# Patient Record
Sex: Female | Born: 1995 | Race: Black or African American | Hispanic: No | Marital: Single | Smoking: Never smoker
Health system: Southern US, Community
[De-identification: ages and names within clinical notes are randomized; demographics above are authoritative.]

## PROBLEM LIST (undated history)

## (undated) ENCOUNTER — Inpatient Hospital Stay (HOSPITAL_COMMUNITY): Payer: Self-pay

## (undated) DIAGNOSIS — D649 Anemia, unspecified: Secondary | ICD-10-CM

## (undated) DIAGNOSIS — Z789 Other specified health status: Secondary | ICD-10-CM

## (undated) HISTORY — PX: TOOTH EXTRACTION: SUR596

## (undated) HISTORY — PX: NO PAST SURGERIES: SHX2092

## (undated) HISTORY — DX: Anemia, unspecified: D64.9

---

## 2015-06-20 ENCOUNTER — Encounter (HOSPITAL_COMMUNITY): Payer: Self-pay | Admitting: *Deleted

## 2015-06-20 ENCOUNTER — Emergency Department (HOSPITAL_COMMUNITY)
Admission: EM | Admit: 2015-06-20 | Discharge: 2015-06-20 | Disposition: A | Discharge status: Home / Self Care | Payer: Medicaid - Out of State | Attending: Emergency Medicine | Admitting: Emergency Medicine

## 2015-06-20 DIAGNOSIS — N939 Abnormal uterine and vaginal bleeding, unspecified: Secondary | ICD-10-CM | POA: Diagnosis not present

## 2015-06-20 DIAGNOSIS — Z202 Contact with and (suspected) exposure to infections with a predominantly sexual mode of transmission: Secondary | ICD-10-CM | POA: Insufficient documentation

## 2015-06-20 NOTE — ED Provider Notes (Signed)
CSN: [id number]     Arrival date & time 06/20/15  1207 History   First MD Initiated Contact with Patient 06/20/15 1247     Chief Complaint  Patient presents with  . Exposure to STD     (Consider location/radiation/quality/duration/timing/severity/associated sxs/prior Treatment) HPI Comments: Pt. Is a 19 y/o F with no significant pmh who presents over concern that she should be tested / treated for an STD. She says her boyfriend was treated for an STD on 7/4 of this month, and that she last had sex with him at the end of June. She says she has not had sex with anyone since. She has not had any vaginal pain, itching, burning, discharge, or abdominal pain. She has not had any nausea or vomiting, fever, or chills. She denies dysuria, hematuria, or suprapubic pain. She has not had any other symptoms. She is anxious because she does not want to have an STD. She does not use condoms with her boyfriend. She is monogomous with him. She has moved down here from [facility]. And does not have a PCP here. She says she tried to go to urgent care, but they could not take her out of state medicaid. She says she would like to follow up with a primary care physician.   The history is provided by the patient.    History reviewed. No pertinent past medical history. History reviewed. No pertinent past surgical history. History reviewed. No pertinent family history. History  Substance Use Topics  . Smoking status: Never Smoker   . Smokeless tobacco: Not on file  . Alcohol Use: No   OB History    No data available     Review of Systems  Constitutional: Negative.  Negative for fever, chills, activity change, appetite change and fatigue.  HENT: Negative.  Negative for congestion and facial swelling.   Eyes: Negative.  Negative for discharge.  Respiratory: Negative.  Negative for apnea, cough, choking and chest tightness.   Cardiovascular: Negative.  Negative for chest pain, palpitations and leg swelling.   Gastrointestinal: Negative.  Negative for nausea, vomiting, abdominal pain, diarrhea, constipation, blood in stool and abdominal distention.  Endocrine: Negative.  Negative for polyuria.  Genitourinary: Positive for frequency. Negative for dysuria, urgency, hematuria, vaginal bleeding, vaginal discharge, difficulty urinating, genital sores, vaginal pain and pelvic pain.  Musculoskeletal: Negative.  Negative for back pain and joint swelling.  Skin: Negative.  Negative for color change, pallor, rash and wound.  Allergic/Immunologic: Negative.   Neurological: Negative.   Hematological: Negative.   Psychiatric/Behavioral: Negative.       Allergies  Review of patient's allergies indicates no known allergies.  Home Medications   Prior to Admission medications   Not on File   BP 137/84 mmHg  Pulse 108  Temp(Src) 98.6 F (37 C) (Oral)  Resp 18  Ht  (1.702 m)  Wt 137 lb 4.8 oz (62.279 kg)  BMI 21.50 kg/m2  SpO2 100%  LMP 06/17/2015 Physical Exam  Constitutional: She is oriented to person, place, and time. She appears well-developed and well-nourished. No distress.  HENT:  Head: Normocephalic and atraumatic.  Nose: Nose normal.  Mouth/Throat: Oropharynx is clear and moist.  Eyes: Conjunctivae and EOM are normal. Pupils are equal, round, and reactive to light. Right eye exhibits no discharge.  Neck: Normal range of motion. Neck supple. No thyromegaly present.  Cardiovascular: Normal rate, regular rhythm, normal heart sounds and intact distal pulses.  Exam reveals no gallop and no friction rub.  No murmur heard. Pulmonary/Chest: Effort normal and breath sounds normal. No respiratory distress. She has no wheezes. She has no rales.  Abdominal: Soft. Bowel sounds are normal. She exhibits no distension and no mass. There is no hepatosplenomegaly. There is no tenderness. There is no rebound, no guarding and no CVA tenderness.  Genitourinary: Uterus normal. Pelvic exam was performed  with patient supine. There is no rash, tenderness, lesion or injury on the right labia. There is no rash, tenderness, lesion or injury on the left labia. Cervix exhibits no discharge and no friability. There is bleeding in the vagina. No erythema or tenderness in the vagina. No foreign body around the vagina. No vaginal discharge found.  Bleeding with blood in the vaginal vault. On her period.   Musculoskeletal: Normal range of motion. She exhibits no edema or tenderness.  Neurological: She is alert and oriented to person, place, and time.  Skin: Skin is warm and dry. No rash noted. She is not diaphoretic. No erythema.  Psychiatric: She has a normal mood and affect.    ED Course  Procedures (including critical care time) Labs Review Labs Reviewed  RPR  HIV ANTIBODY (ROUTINE TESTING)  GC/CHLAMYDIA PROBE AMP ([place]) NOT AT [facility]    Imaging Review No results found.   EKG Interpretation None      MDM   Final diagnoses:  Possible exposure to STD   Pt is a 19 y/o F here with concern about STD's. Mostly anxiety related. She is completely asymptomatic at this time. Will perform pelvic exam and STD testing. Pt on her period. No evidence of G/U infection at this time. Will be called with results of testing in the morning. Follow up with primary care physician.     Yolande Jollyaleb G Consuelo Suthers, MD 06/20/15 1521  Nelva Nayobert Beaton, MD 06/20/15 [phone]

## 2015-06-20 NOTE — ED Notes (Signed)
Pt reports exposure to std and wants to be checked, denies any vaginal symptoms. No acute distress noted at triage.

## 2015-06-20 NOTE — Discharge Instructions (Signed)

## 2015-06-21 LAB — HIV ANTIBODY (ROUTINE TESTING W REFLEX): HIV SCREEN 4TH GENERATION: NONREACTIVE

## 2015-06-21 LAB — GC/CHLAMYDIA PROBE AMP (~~LOC~~) NOT AT ARMC
Chlamydia: NEGATIVE
NEISSERIA GONORRHEA: NEGATIVE

## 2015-06-21 LAB — RPR: RPR Ser Ql: NONREACTIVE

## 2015-06-22 ENCOUNTER — Telehealth (HOSPITAL_COMMUNITY): Payer: Self-pay

## 2015-08-16 ENCOUNTER — Encounter (HOSPITAL_COMMUNITY): Payer: Self-pay | Admitting: Emergency Medicine

## 2015-08-16 ENCOUNTER — Emergency Department (HOSPITAL_COMMUNITY)
Admission: EM | Admit: 2015-08-16 | Discharge: 2015-08-16 | Disposition: A | Discharge status: Home / Self Care | Payer: Medicaid - Out of State | Attending: Emergency Medicine | Admitting: Emergency Medicine

## 2015-08-16 ENCOUNTER — Emergency Department (HOSPITAL_COMMUNITY): Payer: Medicaid - Out of State

## 2015-08-16 DIAGNOSIS — F1210 Cannabis abuse, uncomplicated: Secondary | ICD-10-CM | POA: Insufficient documentation

## 2015-08-16 DIAGNOSIS — Y998 Other external cause status: Secondary | ICD-10-CM | POA: Insufficient documentation

## 2015-08-16 DIAGNOSIS — S0011XA Contusion of right eyelid and periocular area, initial encounter: Secondary | ICD-10-CM | POA: Diagnosis not present

## 2015-08-16 DIAGNOSIS — W1839XA Other fall on same level, initial encounter: Secondary | ICD-10-CM | POA: Insufficient documentation

## 2015-08-16 DIAGNOSIS — T148XXA Other injury of unspecified body region, initial encounter: Secondary | ICD-10-CM

## 2015-08-16 DIAGNOSIS — Y9289 Other specified places as the place of occurrence of the external cause: Secondary | ICD-10-CM | POA: Insufficient documentation

## 2015-08-16 DIAGNOSIS — Y9389 Activity, other specified: Secondary | ICD-10-CM | POA: Diagnosis not present

## 2015-08-16 DIAGNOSIS — D500 Iron deficiency anemia secondary to blood loss (chronic): Secondary | ICD-10-CM | POA: Insufficient documentation

## 2015-08-16 DIAGNOSIS — S0993XA Unspecified injury of face, initial encounter: Secondary | ICD-10-CM | POA: Diagnosis present

## 2015-08-16 LAB — I-STAT BETA HCG BLOOD, ED (MC, WL, AP ONLY): I-stat hCG, quantitative: <5 m[IU]/mL (ref <5)

## 2015-08-16 LAB — CBC WITH DIFFERENTIAL/PLATELET
BASOS ABS: 0 10*3/uL (ref 0.0–0.1)
Basophils Relative: 1 % (ref 0–1)
Eosinophils Absolute: 0 10*3/uL (ref 0.0–0.7)
Eosinophils Relative: 1 % (ref 0–5)
HCT: 23.3 % — ABNORMAL LOW (ref 36.0–46.0)
Hemoglobin: 6.6 g/dL — CL (ref 12.0–15.0)
Lymphocytes Relative: 32 % (ref 12–46)
Lymphs Abs: 1.4 10*3/uL (ref 0.7–4.0)
MCH: 19.1 pg — ABNORMAL LOW (ref 26.0–34.0)
MCHC: 28.3 g/dL — AB (ref 30.0–36.0)
MCV: 67.3 fL — ABNORMAL LOW (ref 78.0–100.0)
MONO ABS: 0.4 10*3/uL (ref 0.1–1.0)
MONOS PCT: 10 % (ref 3–12)
NEUTROS ABS: 2.6 10*3/uL (ref 1.7–7.7)
Neutrophils Relative %: 56 % (ref 43–77)
PLATELETS: 243 10*3/uL (ref 150–400)
RBC: 3.46 MIL/uL — AB (ref 3.87–5.11)
RDW: 18 % — AB (ref 11.5–15.5)
WBC: 4.4 10*3/uL (ref 4.0–10.5)

## 2015-08-16 LAB — COMPREHENSIVE METABOLIC PANEL
ALBUMIN: 4.2 g/dL (ref 3.5–5.0)
ALT: 12 U/L — ABNORMAL LOW (ref 14–54)
ANION GAP: 8 (ref 5–15)
AST: 22 U/L (ref 15–41)
Alkaline Phosphatase: 45 U/L (ref 38–126)
BILIRUBIN TOTAL: 0.4 mg/dL (ref 0.3–1.2)
BUN: 6 mg/dL (ref 6–20)
CO2: 24 mmol/L (ref 22–32)
Calcium: 9.2 mg/dL (ref 8.9–10.3)
Chloride: 111 mmol/L (ref 101–111)
Creatinine, Ser: 0.59 mg/dL (ref 0.44–1.00)
GFR calc Af Amer: >60 mL/min (ref >60)
GFR calc non Af Amer: >60 mL/min (ref >60)
Glucose, Bld: 89 mg/dL (ref 65–99)
POTASSIUM: 4 mmol/L (ref 3.5–5.1)
SODIUM: 143 mmol/L (ref 135–145)
TOTAL PROTEIN: 7.3 g/dL (ref 6.5–8.1)

## 2015-08-16 LAB — URINALYSIS, ROUTINE W REFLEX MICROSCOPIC
Bilirubin Urine: NEGATIVE
Glucose, UA: NEGATIVE mg/dL
Hgb urine dipstick: NEGATIVE
KETONES UR: NEGATIVE mg/dL
LEUKOCYTES UA: NEGATIVE
NITRITE: NEGATIVE
PH: 8 (ref 5.0–8.0)
Protein, ur: NEGATIVE mg/dL
Specific Gravity, Urine: 1.013 (ref 1.005–1.030)
Urobilinogen, UA: 0.2 mg/dL (ref 0.0–1.0)

## 2015-08-16 LAB — SAMPLE TO BLOOD BANK

## 2015-08-16 LAB — RAPID URINE DRUG SCREEN, HOSP PERFORMED
Amphetamines: NOT DETECTED
BENZODIAZEPINES: NOT DETECTED
Barbiturates: NOT DETECTED
COCAINE: NOT DETECTED
OPIATES: NOT DETECTED
TETRAHYDROCANNABINOL: POSITIVE — AB

## 2015-08-16 LAB — ETHANOL: Alcohol, Ethyl (B): 108 mg/dL — ABNORMAL HIGH (ref <5)

## 2015-08-16 MED ORDER — LORAZEPAM 2 MG/ML IJ SOLN
0.5000 mg | Freq: Once | INTRAMUSCULAR | Status: AC
  Administered 2015-08-16: 0.5 mg via INTRAVENOUS
  Filled 2015-08-16: qty 1

## 2015-08-16 MED ORDER — SODIUM CHLORIDE 0.9 % IV BOLUS (SEPSIS)
1000.0000 mL | Freq: Once | INTRAVENOUS | Status: AC
  Administered 2015-08-16: 1000 mL via INTRAVENOUS

## 2015-08-16 MED ORDER — FERROUS SULFATE 325 (65 FE) MG PO TABS
325.0000 mg | ORAL_TABLET | Freq: Every day | ORAL | Status: DC
Start: 2015-08-16 — End: 2016-10-28

## 2015-08-16 NOTE — ED Notes (Signed)
Critical Hmg 6.6-MD Pollina made aware.

## 2015-08-16 NOTE — Discharge Instructions (Signed)
Hematoma A hematoma is a collection of blood under the skin, in an organ, in a body space, in a joint space, or in other tissue. The blood can clot to form a lump that you can see and feel. The lump is often firm and may sometimes become sore and tender. Most hematomas get better in a few days to weeks. However, some hematomas may be serious and require medical care. Hematomas can range in size from very small to very large. CAUSES  A hematoma can be caused by a blunt or penetrating injury. It can also be caused by spontaneous leakage from a blood vessel under the skin. Spontaneous leakage from a blood vessel is more likely to occur in older people, especially those taking blood thinners. Sometimes, a hematoma can develop after certain medical procedures. SIGNS AND SYMPTOMS   A firm lump on the body.  Possible pain and tenderness in the area.  Bruising.Blue, dark blue, purple-red, or yellowish skin may appear at the site of the hematoma if the hematoma is close to the surface of the skin. For hematomas in deeper tissues or body spaces, the signs and symptoms may be subtle. For example, an intra-abdominal hematoma may cause abdominal pain, weakness, fainting, and shortness of breath. An intracranial hematoma may cause a headache or symptoms such as weakness, trouble speaking, or a change in consciousness. DIAGNOSIS  A hematoma can usually be diagnosed based on your medical history and a physical exam. Imaging tests may be needed if your health care provider suspects a hematoma in deeper tissues or body spaces, such as the abdomen, head, or chest. These tests may include ultrasonography or a CT scan.  TREATMENT  Hematomas usually go away on their own over time. Rarely does the blood need to be drained out of the body. Large hematomas or those that may affect vital organs will sometimes need surgical drainage or monitoring. HOME CARE INSTRUCTIONS   Apply ice to the injured area:   Put ice in a  plastic bag.   Place a towel between your skin and the bag.   Leave the ice on for 20 minutes, 2-3 times a day for the first 1 to 2 days.   After the first 2 days, switch to using warm compresses on the hematoma.   Elevate the injured area to help decrease pain and swelling. Wrapping the area with an elastic bandage may also be helpful. Compression helps to reduce swelling and promotes shrinking of the hematoma. Make sure the bandage is not wrapped too tight.   If your hematoma is on a lower extremity and is painful, crutches may be helpful for a couple days.   Only take over-the-counter or prescription medicines as directed by your health care provider. SEEK IMMEDIATE MEDICAL CARE IF:   You have increasing pain, or your pain is not controlled with medicine.   You have a fever.   You have worsening swelling or discoloration.   Your skin over the hematoma breaks or starts bleeding.   Your hematoma is in your chest or abdomen and you have weakness, shortness of breath, or a change in consciousness.  Your hematoma is on your scalp (caused by a fall or injury) and you have a worsening headache or a change in alertness or consciousness. MAKE SURE YOU:   Understand these instructions.  Will watch your condition.  Will get help right away if you are not doing well or get worse. Document Released: 07/04/2004 Document Revised: 07/23/2013 Document Reviewed: 04/30/2013  ExitCare Patient Information 2015 [place], [place]. This information is not intended to replace advice given to you by your health care provider. Make sure you discuss any questions you have with your health care provider.  Iron Deficiency Anemia Anemia is a condition in which there are less red blood cells or hemoglobin in the blood than normal. Hemoglobin is the part of red blood cells that carries oxygen. Iron deficiency anemia is anemia caused by too little iron. It is the most common type of anemia. It may leave  you tired and short of breath. CAUSES   Lack of iron in the diet.  Poor absorption of iron, as seen with intestinal disorders.  Intestinal bleeding.  Heavy periods. SIGNS AND SYMPTOMS  Mild anemia may not be noticeable. Symptoms may include:  Fatigue.  Headache.  Pale skin.  Weakness.  Tiredness.  Shortness of breath.  Dizziness.  Cold hands and feet.  Fast or irregular heartbeat. DIAGNOSIS  Diagnosis requires a thorough evaluation and physical exam by your health care provider. Blood tests are generally used to confirm iron deficiency anemia. Additional tests may be done to find the underlying cause of your anemia. These may include:  Testing for blood in the stool (fecal occult blood test).  A procedure to see inside the colon and rectum (colonoscopy).  A procedure to see inside the esophagus and stomach (endoscopy). TREATMENT  Iron deficiency anemia is treated by correcting the cause of the deficiency. Treatment may involve:  Adding iron-rich foods to your diet.  Taking iron supplements. Pregnant or breastfeeding women need to take extra iron because their normal diet usually does not provide the required amount.  Taking vitamins. Vitamin C improves the absorption of iron. Your health care provider may recommend that you take your iron tablets with a glass of orange juice or vitamin C supplement.  Medicines to make heavy menstrual flow lighter.  Surgery. HOME CARE INSTRUCTIONS   Take iron as directed by your health care provider.  If you cannot tolerate taking iron supplements by mouth, talk to your health care provider about taking them through a vein (intravenously) or an injection into a muscle.  For the best iron absorption, iron supplements should be taken on an empty stomach. If you cannot tolerate them on an empty stomach, you may need to take them with food.  Do not drink milk or take antacids at the same time as your iron supplements. Milk and  antacids may interfere with the absorption of iron.  Iron supplements can cause constipation. Make sure to include fiber in your diet to prevent constipation. A stool softener may also be recommended.  Take vitamins as directed by your health care provider.  Eat a diet rich in iron. Foods high in iron include liver, lean beef, whole-grain bread, eggs, dried fruit, and dark green leafy vegetables. SEEK IMMEDIATE MEDICAL CARE IF:   You faint. If this happens, do not drive. Call your local emergency services (911 in [place] if no other help is available.  You have chest pain.  You feel nauseous or vomit.  You have severe or increased shortness of breath with activity.  You feel weak.  You have a rapid heartbeat.  You have unexplained sweating.  You become light-headed when getting up from a chair or bed. MAKE SURE YOU:   Understand these instructions.  Will watch your condition.  Will get help right away if you are not doing well or get worse. Document Released: 11/17/2000 Document Revised: 11/25/2013 Document  Document Reviewed: 07/28/2013 °ExitCare® Patient Information ©2015 [employer]. This information is not intended to replace advice given to you by your health care provider. Make sure you discuss any questions you have with your health care provider. ° °
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
## 2016-02-28 ENCOUNTER — Encounter (HOSPITAL_COMMUNITY): Payer: Self-pay | Admitting: Emergency Medicine

## 2016-02-28 DIAGNOSIS — Z3202 Encounter for pregnancy test, result negative: Secondary | ICD-10-CM | POA: Insufficient documentation

## 2016-02-28 DIAGNOSIS — B373 Candidiasis of vulva and vagina: Secondary | ICD-10-CM | POA: Insufficient documentation

## 2016-02-28 DIAGNOSIS — N390 Urinary tract infection, site not specified: Secondary | ICD-10-CM | POA: Insufficient documentation

## 2016-02-28 DIAGNOSIS — N760 Acute vaginitis: Secondary | ICD-10-CM | POA: Insufficient documentation

## 2016-02-28 LAB — BASIC METABOLIC PANEL
Anion gap: 8 (ref 5–15)
BUN: 6 mg/dL (ref 6–20)
CHLORIDE: 108 mmol/L (ref 101–111)
CO2: 23 mmol/L (ref 22–32)
Calcium: 9.4 mg/dL (ref 8.9–10.3)
Creatinine, Ser: 0.65 mg/dL (ref 0.44–1.00)
GFR calc Af Amer: >60 mL/min (ref >60)
GFR calc non Af Amer: >60 mL/min (ref >60)
Glucose, Bld: 90 mg/dL (ref 65–99)
Potassium: 3.7 mmol/L (ref 3.5–5.1)
Sodium: 139 mmol/L (ref 135–145)

## 2016-02-28 LAB — CBC WITH DIFFERENTIAL/PLATELET
Basophils Absolute: 0 10*3/uL (ref 0.0–0.1)
Basophils Relative: 1 %
EOS PCT: 3 %
Eosinophils Absolute: 0.1 10*3/uL (ref 0.0–0.7)
HEMATOCRIT: 27.3 % — AB (ref 36.0–46.0)
HEMOGLOBIN: 7.6 g/dL — AB (ref 12.0–15.0)
LYMPHS ABS: 1.5 10*3/uL (ref 0.7–4.0)
Lymphocytes Relative: 41 %
MCH: 19.1 pg — AB (ref 26.0–34.0)
MCHC: 27.8 g/dL — ABNORMAL LOW (ref 30.0–36.0)
MCV: 68.6 fL — AB (ref 78.0–100.0)
MONOS PCT: 11 %
Monocytes Absolute: 0.4 10*3/uL (ref 0.1–1.0)
NEUTROS ABS: 1.7 10*3/uL (ref 1.7–7.7)
Neutrophils Relative %: 44 %
Platelets: 321 10*3/uL (ref 150–400)
RBC: 3.98 MIL/uL (ref 3.87–5.11)
RDW: 17.5 % — ABNORMAL HIGH (ref 11.5–15.5)
WBC: 3.7 10*3/uL — ABNORMAL LOW (ref 4.0–10.5)

## 2016-02-28 LAB — URINALYSIS, ROUTINE W REFLEX MICROSCOPIC
BILIRUBIN URINE: NEGATIVE
Glucose, UA: NEGATIVE mg/dL
Hgb urine dipstick: NEGATIVE
Ketones, ur: NEGATIVE mg/dL
Nitrite: NEGATIVE
PH: 7 (ref 5.0–8.0)
Protein, ur: NEGATIVE mg/dL
SPECIFIC GRAVITY, URINE: 1.014 (ref 1.005–1.030)

## 2016-02-28 LAB — URINE MICROSCOPIC-ADD ON

## 2016-02-28 LAB — POC URINE PREG, ED: PREG TEST UR: NEGATIVE

## 2016-02-28 NOTE — ED Notes (Signed)
Pt. requesting STD screening reports vaginal discharge onset last week , denies dysuria or fever .

## 2016-02-29 ENCOUNTER — Emergency Department (HOSPITAL_COMMUNITY)
Admission: EM | Admit: 2016-02-29 | Discharge: 2016-02-29 | Disposition: A | Discharge status: Home / Self Care | Payer: Medicaid - Out of State | Attending: Physician Assistant | Admitting: Physician Assistant

## 2016-02-29 DIAGNOSIS — N760 Acute vaginitis: Secondary | ICD-10-CM

## 2016-02-29 DIAGNOSIS — N390 Urinary tract infection, site not specified: Secondary | ICD-10-CM

## 2016-02-29 DIAGNOSIS — B379 Candidiasis, unspecified: Secondary | ICD-10-CM

## 2016-02-29 DIAGNOSIS — B9689 Other specified bacterial agents as the cause of diseases classified elsewhere: Secondary | ICD-10-CM

## 2016-02-29 LAB — HIV ANTIBODY (ROUTINE TESTING W REFLEX): HIV Screen 4th Generation wRfx: NONREACTIVE

## 2016-02-29 LAB — GC/CHLAMYDIA PROBE AMP (~~LOC~~) NOT AT ARMC
CHLAMYDIA, DNA PROBE: NEGATIVE
NEISSERIA GONORRHEA: NEGATIVE

## 2016-02-29 LAB — WET PREP, GENITAL
SPERM: NONE SEEN
Trich, Wet Prep: NONE SEEN

## 2016-02-29 LAB — RPR: RPR: NONREACTIVE

## 2016-02-29 MED ORDER — METRONIDAZOLE 500 MG PO TABS
500.0000 mg | ORAL_TABLET | Freq: Once | ORAL | Status: AC
Start: 2016-02-29 — End: 2016-02-29
  Administered 2016-02-29: 500 mg via ORAL
  Filled 2016-02-29: qty 1

## 2016-02-29 MED ORDER — CEFTRIAXONE SODIUM 250 MG IJ SOLR: 250.0000 mg | Freq: Once | INTRAMUSCULAR | Status: DC

## 2016-02-29 MED ORDER — ONDANSETRON HCL 4 MG PO TABS
4.0000 mg | ORAL_TABLET | Freq: Once | ORAL | Status: AC
  Administered 2016-02-29: 4 mg via ORAL
  Filled 2016-02-29: qty 1

## 2016-02-29 MED ORDER — CEFTRIAXONE SODIUM 1 G IJ SOLR
1.0000 g | Freq: Once | INTRAMUSCULAR | Status: AC
  Administered 2016-02-29: 1 g via INTRAMUSCULAR
  Filled 2016-02-29: qty 10

## 2016-02-29 MED ORDER — LIDOCAINE HCL (PF) 1 % IJ SOLN
2.0000 mL | Freq: Once | INTRAMUSCULAR | Status: AC
  Administered 2016-02-29: 2 mL
  Filled 2016-02-29: qty 5

## 2016-02-29 MED ORDER — AZITHROMYCIN 250 MG PO TABS
1000.0000 mg | ORAL_TABLET | Freq: Once | ORAL | Status: AC
  Administered 2016-02-29: 1000 mg via ORAL
  Filled 2016-02-29: qty 4

## 2016-02-29 MED ORDER — METRONIDAZOLE 500 MG PO TABS: 500.0000 mg | ORAL_TABLET | Freq: Two times a day (BID) | ORAL | Status: DC

## 2016-02-29 MED ORDER — NITROFURANTOIN MONOHYD MACRO 100 MG PO CAPS
100.0000 mg | ORAL_CAPSULE | Freq: Once | ORAL | Status: AC
  Administered 2016-02-29: 100 mg via ORAL
  Filled 2016-02-29: qty 1

## 2016-02-29 MED ORDER — NITROFURANTOIN MONOHYD MACRO 100 MG PO CAPS: 100.0000 mg | ORAL_CAPSULE | Freq: Two times a day (BID) | ORAL | Status: DC

## 2016-02-29 MED ORDER — FLUCONAZOLE 100 MG PO TABS
150.0000 mg | ORAL_TABLET | Freq: Once | ORAL | Status: AC
  Administered 2016-02-29: 150 mg via ORAL
  Filled 2016-02-29: qty 2

## 2016-02-29 NOTE — Discharge Instructions (Signed)

## 2016-02-29 NOTE — ED Provider Notes (Addendum)
CSN: [id number]     Arrival date & time 02/28/16  2037 History  By signing my name below, I, Darlene Ortiz, attest that this documentation has been prepared under the direction and in the presence of Darlene Grell Ortiz An, MD. Electronically Signed: Budd Ortiz, ED Scribe. 02/29/2016. 2:30 AM.      Chief Complaint  Patient presents with  . Vaginal Discharge   The history is provided by the patient. No language interpreter was used.   HPI Comments: Darlene Ortiz is a 20 y.o. female who presents to the Emergency Department complaining of vaginal discharge onset 1 week ago. Pt states she thought the discharge was due to her coming off of her period on 3/10, but is now concerned for a possible STD. She notes her partner was seen for dysuria today and treated for gonorrhea. Pt denies fever and dysuria. She reports NKDA.   History reviewed. No pertinent past medical history. History reviewed. No pertinent past surgical history. No family history on file. Social History  Substance Use Topics  . Smoking status: Never Smoker   . Smokeless tobacco: None  . Alcohol Use: Yes   OB History    No data available     Review of Systems  Constitutional: Negative for fever.  Genitourinary: Positive for vaginal discharge. Negative for dysuria.  All other systems reviewed and are negative.   Allergies  Review of patient's allergies indicates no known allergies.  Home Medications   Prior to Admission medications   Medication Sig Start Date End Date Taking? Authorizing Provider  ferrous sulfate 325 (65 FE) MG tablet Take 1 tablet (325 mg total) by mouth daily. Patient not taking: Reported on 02/29/2016 08/16/15   Gilda Crease, MD   BP 136/88 mmHg  Pulse 86  Temp(Src) 98.9 F (37.2 C) (Oral)  Resp 16  Ht  (1.702 m)  Wt 134 lb (60.782 kg)  BMI 20.98 kg/m2  SpO2 95%  LMP 02/08/2016 (Approximate) Physical Exam  Constitutional: She is oriented to person, place, and time. She  appears well-developed and well-nourished.  HENT:  Head: Normocephalic and atraumatic.  Eyes: Conjunctivae are normal. Right eye exhibits no discharge. Left eye exhibits no discharge.  Pulmonary/Chest: Effort normal. No respiratory distress.  Genitourinary:  Female chaperone present, NO CMT  Neurological: She is alert and oriented to person, place, and time. Coordination normal.  Skin: Skin is warm and dry. No rash noted. She is not diaphoretic. No erythema.  Psychiatric: She has a normal mood and affect.  Nursing note and vitals reviewed.   ED Course  Procedures  DIAGNOSTIC STUDIES: Oxygen Saturation is 100% on RA, normal by my interpretation.    COORDINATION OF CARE: 2:16 AM - Discussed lab results of UTI and plans to order antibiotics. Will treat for gonorrhea and chlamydia. Pt advised of plan for treatment and pt agrees.  Labs Review Labs Reviewed  WET PREP, GENITAL - Abnormal; Notable for the following:    Yeast Wet Prep HPF POC PRESENT (*)    Clue Cells Wet Prep HPF POC PRESENT (*)    WBC, Wet Prep HPF POC MANY (*)    All other components within normal limits  URINALYSIS, ROUTINE W REFLEX MICROSCOPIC (NOT AT [facility]) - Abnormal; Notable for the following:    APPearance CLOUDY (*)    Leukocytes, UA SMALL (*)    All other components within normal limits  CBC WITH DIFFERENTIAL/PLATELET - Abnormal; Notable for the following:    WBC 3.7 (*)  Hemoglobin 7.6 (*)    HCT 27.3 (*)    MCV 68.6 (*)    MCH 19.1 (*)    MCHC 27.8 (*)    RDW 17.5 (*)    All other components within normal limits  URINE MICROSCOPIC-ADD ON - Abnormal; Notable for the following:    Squamous Epithelial / LPF 6-30 (*)    Bacteria, UA FEW (*)    All other components within normal limits  BASIC METABOLIC PANEL  HIV ANTIBODY (ROUTINE TESTING)  RPR  POC URINE PREG, ED  GC/CHLAMYDIA PROBE AMP ([place]) NOT AT [facility]    Imaging Review No results found. I have personally reviewed and evaluated these  images and lab results as part of my medical decision-making.   EKG Interpretation None      MDM   Final diagnoses:  None    Patient is a 20 year old female presenting with vaginal discharge. Patient's partner came to the emergency department with dysuria and was treated prophylactically. Patient like the same treatment. Vaginal exam showed no cervical motion tenderness. No evidence of discharge. We will treat with ceftriaxone and azithromycin. Patient also has urinary tract infection. We'll treat with Macrobid.  I personally performed the services described in this documentation, which was scribed in my presence. The recorded information has been reviewed and is accurate.    3:41 AM In addition to the urinary tract infection, patient has evidence of BV and yeast infection. This is in addition to prophylactic treatment for GC and chlamydia.  We'll give single dose of Diflucan. We'll give Flagyl along with Macrobid to take at home.  Darlene Ortiz AnLyn Cougar Imel, MD 02/29/16 0244  Darlene Ortiz Ortiz AnLyn Tamula Morrical, MD 02/29/16 [phone]

## 2016-10-28 ENCOUNTER — Inpatient Hospital Stay (HOSPITAL_COMMUNITY)
Admission: AD | Admit: 2016-10-28 | Discharge: 2016-10-28 | Disposition: A | Discharge status: Home / Self Care | Payer: Medicaid - Out of State | Source: Ambulatory Visit | Attending: Obstetrics and Gynecology | Admitting: Obstetrics and Gynecology

## 2016-10-28 ENCOUNTER — Encounter (HOSPITAL_COMMUNITY): Payer: Self-pay | Admitting: *Deleted

## 2016-10-28 DIAGNOSIS — F10129 Alcohol abuse with intoxication, unspecified: Secondary | ICD-10-CM | POA: Insufficient documentation

## 2016-10-28 DIAGNOSIS — F10120 Alcohol abuse with intoxication, uncomplicated: Secondary | ICD-10-CM

## 2016-10-28 DIAGNOSIS — R109 Unspecified abdominal pain: Secondary | ICD-10-CM

## 2016-10-28 DIAGNOSIS — N898 Other specified noninflammatory disorders of vagina: Secondary | ICD-10-CM

## 2016-10-28 HISTORY — DX: Other specified health status: Z78.9

## 2016-10-28 LAB — URINALYSIS, ROUTINE W REFLEX MICROSCOPIC
Bilirubin Urine: NEGATIVE
GLUCOSE, UA: NEGATIVE mg/dL
Hgb urine dipstick: NEGATIVE
Ketones, ur: NEGATIVE mg/dL
LEUKOCYTES UA: NEGATIVE
Nitrite: NEGATIVE
PROTEIN: NEGATIVE mg/dL
SPECIFIC GRAVITY, URINE: 1.015 (ref 1.005–1.030)
pH: 6.5 (ref 5.0–8.0)

## 2016-10-28 LAB — ETHANOL: Alcohol, Ethyl (B): <5 mg/dL (ref <5)

## 2016-10-28 LAB — RAPID URINE DRUG SCREEN, HOSP PERFORMED
AMPHETAMINES: NOT DETECTED
Barbiturates: NOT DETECTED
Benzodiazepines: NOT DETECTED
Cocaine: NOT DETECTED
Opiates: NOT DETECTED
TETRAHYDROCANNABINOL: POSITIVE — AB

## 2016-10-28 LAB — WET PREP, GENITAL
Sperm: NONE SEEN
TRICH WET PREP: NONE SEEN
Yeast Wet Prep HPF POC: NONE SEEN

## 2016-10-28 LAB — POCT PREGNANCY, URINE: PREG TEST UR: NEGATIVE

## 2016-10-28 MED ORDER — ONDANSETRON 8 MG PO TBDP: 8.0000 mg | ORAL_TABLET | Freq: Three times a day (TID) | ORAL | 0 refills | Status: DC | PRN

## 2016-10-28 MED ORDER — ONDANSETRON 8 MG PO TBDP
8.0000 mg | ORAL_TABLET | Freq: Once | ORAL | Status: AC
  Administered 2016-10-28: 8 mg via ORAL
  Filled 2016-10-28: qty 1

## 2016-10-28 NOTE — MAU Provider Note (Signed)
History     CSN: [id number]  Arrival date and time: 10/28/16 1337   First Provider Initiated Contact with Patient 10/28/16 1619      Chief Complaint  Patient presents with  . Abdominal Cramping  . Vaginal Discharge   Darlene Ortiz is a 20 y.o. Who presents today with N/V. She states that she went out last night, and was drinking. She is unsure if there was something in her drink. She states that when she vomits now it tastes very sour. She also reports that she has had vaginal discharge for over a month, and she wants to have it checked while she is here.    Abdominal Cramping  This is a new problem. The current episode started today. The onset quality is sudden. The problem occurs constantly. The problem has been unchanged. The pain is located in the generalized abdominal region. The pain is at a severity of 10/10. The quality of the pain is sharp. The abdominal pain does not radiate. Associated symptoms include nausea and vomiting. Pertinent negatives include no constipation, diarrhea, dysuria, fever or frequency. Nothing aggravates the pain. The pain is relieved by nothing.  Vaginal Discharge  The patient's primary symptoms include vaginal discharge. This is a new problem. The current episode started more than 1 month ago. The problem occurs constantly. The problem has been unchanged. The patient is experiencing no pain. Associated symptoms include abdominal pain, nausea and vomiting. Pertinent negatives include no chills, constipation, diarrhea, dysuria, fever, frequency or urgency. The vaginal discharge was thick, white and malodorous. There has been no bleeding. Nothing aggravates the symptoms. She has tried nothing for the symptoms. She uses nothing for contraception.     Past Medical History:  Diagnosis Date  . Medical history non-contributory     Past Surgical History:  Procedure Laterality Date  . NO PAST SURGERIES      History reviewed. No pertinent family  history.  Social History  Substance Use Topics  . Smoking status: Never Smoker  . Smokeless tobacco: Never Used  . Alcohol use Yes    Allergies: No Known Allergies  No prescriptions prior to admission.    Review of Systems  Constitutional: Negative for chills and fever.  Gastrointestinal: Positive for abdominal pain, nausea and vomiting. Negative for constipation and diarrhea.  Genitourinary: Positive for vaginal discharge. Negative for dysuria, frequency and urgency.   Physical Exam   Blood pressure 124/61, pulse 91, temperature 98.3 F (36.8 C), temperature source Oral, resp. rate 16, weight 135 lb 6.4 oz (61.4 kg), last menstrual period 09/28/2016.  Physical Exam  Nursing note and vitals reviewed. Constitutional: She is oriented to person, place, and time. She appears well-developed and well-nourished. No distress.  HENT:  Head: Normocephalic.  Cardiovascular: Normal rate.   Respiratory: Effort normal.  GI: Soft. There is no tenderness. There is no rebound.  Neurological: She is alert and oriented to person, place, and time.  Skin: Skin is warm and dry.  Psychiatric: She has a normal mood and affect.   Results for orders placed or performed during the hospital encounter of 10/28/16 (from the past 24 hour(s))  Urinalysis, Routine w reflex microscopic (not at [facility])     Status: None   Collection Time: 10/28/16  3:06 PM  Result Value Ref Range   Color, Urine YELLOW YELLOW   APPearance CLEAR CLEAR   Specific Gravity, Urine 1.015 1.005 - 1.030   pH 6.5 5.0 - 8.0   Glucose, UA NEGATIVE NEGATIVE mg/dL  Hgb urine dipstick NEGATIVE NEGATIVE   Bilirubin Urine NEGATIVE NEGATIVE   Ketones, ur NEGATIVE NEGATIVE mg/dL   Protein, ur NEGATIVE NEGATIVE mg/dL   Nitrite NEGATIVE NEGATIVE   Leukocytes, UA NEGATIVE NEGATIVE  Urine rapid drug screen (hosp performed)     Status: Abnormal   Collection Time: 10/28/16  3:06 PM  Result Value Ref Range   Opiates NONE DETECTED NONE  DETECTED   Cocaine NONE DETECTED NONE DETECTED   Benzodiazepines NONE DETECTED NONE DETECTED   Amphetamines NONE DETECTED NONE DETECTED   Tetrahydrocannabinol POSITIVE (A) NONE DETECTED   Barbiturates NONE DETECTED NONE DETECTED  Pregnancy, urine POC     Status: None   Collection Time: 10/28/16  3:17 PM  Result Value Ref Range   Preg Test, Ur NEGATIVE NEGATIVE  Wet prep, genital     Status: Abnormal   Collection Time: 10/28/16  4:31 PM  Result Value Ref Range   Yeast Wet Prep HPF POC NONE SEEN NONE SEEN   Trich, Wet Prep NONE SEEN NONE SEEN   Clue Cells Wet Prep HPF POC PRESENT (A) NONE SEEN   WBC, Wet Prep HPF POC FEW (A) NONE SEEN   Sperm NONE SEEN   Ethanol     Status: None   Collection Time: 10/28/16  5:26 PM  Result Value Ref Range   Alcohol, Ethyl (B) <5 <5 mg/dL    MAU Course  Procedures  MDM Patient has had zofran. N/V is controlled here.   Assessment and Plan   1. Hangover effect, uncomplicated (HCC)    . DC home Comfort measures reviewed  RX: zofran ODT PRN #20  Return to MAU as needed   Follow-up Information    [facility]-EMERGENCY DEPT Follow up.   Specialty:  Emergency Medicine Contact information: [address] [id number] [place] [id number] [phone]           Darlene Ortiz, Darlene Ortiz 10/28/2016, 7:05 PM

## 2016-10-28 NOTE — Discharge Instructions (Signed)
Alcohol Intoxication Alcohol intoxication occurs when a person no longer thinks clearly or functions well (becomes impaired) after drinking alcohol. Intoxication can occur with even one drink. The level of impairment depends on:  The amount of alcohol the person had.  The person's age, gender, and weight.  How often the person drinks.  Whether the person has other medical conditions, such as diabetes, seizures, or a heart condition. Alcohol intoxication can range in severity from mild to severe. The condition can be dangerous, especially when caused by drinking large amounts of alcohol in a short period of time (binge drinking) or if the person also took certain prescription medicines or recreational drugs. What are the signs or symptoms? Symptoms of mild alcohol intoxication include:  Feeling relaxed or sleepy.  Mild difficulty with:  Coordination.  Speech.  Memory.  Attention. Symptoms of moderate alcohol intoxication include:  Extreme emotions, such as anger or sadness.  Moderate difficulty with:  Coordination.  Speech.  Memory.  Attention. Symptoms of severe alcohol intoxication include:  Passing out.  Vomiting.  Confusion.  Slow breathing.  Coma.  Severe difficulty with:  Coordination.  Speech.  Memory.  Attention. Intoxication, especially in people who are not exposed to alcohol often can progress from mild to severe quickly, and may even cause coma or death. How is this diagnosed? This condition may be diagnosed based on:  A medical history.  A physical exam.  A blood test that measures the concentration of alcohol in the blood (blood alcohol content, or BAC).  Whether there is a smell of alcohol on the breath. Your health care provider will ask you how much alcohol you drank and what kind of alcohol you had. How is this treated? Usually, treatment is not needed for this condition. Most of the effects of alcohol are temporary and go away  as the alcohol naturally leaves the body. Your health care provider may recommend monitoring until the alcohol level starts to drop and it is safe to go home. You may also get fluids through an IV tube to help prevent dehydration. If the intoxication is severe, a breathing machine called a ventilator may be needed to support your breathing, Follow these instructions at home:  Do not drive after drinking alcohol.  Have someone stay with you while you are intoxicated. You should not be left alone.  Stay hydrated. Drink enough fluid to keep your urine clear or pale yellow.  Avoid caffeine because it can dehydrate you.  Take over-the-counter and prescription medicines only as told by your health care provider. How is this prevented? To prevent alcohol intoxication:  Limit alcohol intake to no more than 1 drink a day for nonpregnant women and 2 drinks a day for men. One drink equals 12 oz of beer, 5 oz of wine, or 1 oz of hard liquor.  Do not drink alcohol on an empty stomach.  Avoid drinking alcohol if:  You are under the legal drinking age.  You are pregnant or may be pregnant.  You are taking medicines that should not be taken with alcohol.  Your drinking causes your medical condition to get worse.  You need to drive or perform activities that require your attention.  You have substance use disorder. To prevent potentially serious complications of alcohol intoxication, seek immediate medical care if you or someone you know has signs of moderate or severe alcohol intoxication. These include:  Moderate or severe difficulty with:  Coordination.  Speech.  Memory.  Attention.  Passing out.  Confusion.  Vomiting. Do not leave someone alone if he or she is intoxicated. Contact a health care provider if:  You do not feel better after a few days.  You are having problems at work, at school, or at home due to drinking. Get help right away if:  You become shaky when you  try to stop drinking.  You shake uncontrollably (have a seizure).  You vomit blood. Blood in vomit may look bright red, or it may look like coffee grounds.  You have blood in your stool. Blood in stool may be bright red, or it may make stool appear black and tarry and make it smell bad.  You become light-headed or you faint. If you ever feel like you may hurt yourself or others, or have thoughts about taking your own life, get help right away. You can go to your nearest emergency department or call:  Your local emergency services (911 in the [place]  A suicide crisis helpline, such as the National Suicide Prevention Lifeline at (947) 070-82601-8586401783. This is open 24 hours a day. This information is not intended to replace advice given to you by your health care provider. Make sure you discuss any questions you have with your health care provider. Document Released: 08/30/2005 Document Revised: 08/05/2016 Document Reviewed: 08/05/2016 Elsevier Interactive Patient Education  2017 [employer]

## 2016-10-28 NOTE — MAU Note (Signed)
Cramping really bad, started this morning. back is hurting.  Missed period this month. Has not done home test.

## 2016-11-01 LAB — GC/CHLAMYDIA PROBE AMP (~~LOC~~) NOT AT ARMC
CHLAMYDIA, DNA PROBE: NEGATIVE
Neisseria Gonorrhea: NEGATIVE

## 2017-01-06 ENCOUNTER — Encounter (HOSPITAL_COMMUNITY): Payer: Self-pay

## 2017-01-06 ENCOUNTER — Emergency Department (HOSPITAL_COMMUNITY)
Admission: EM | Admit: 2017-01-06 | Discharge: 2017-01-06 | Disposition: A | Discharge status: Home / Self Care | Payer: Medicaid - Out of State | Attending: Emergency Medicine | Admitting: Emergency Medicine

## 2017-01-06 DIAGNOSIS — Z79899 Other long term (current) drug therapy: Secondary | ICD-10-CM | POA: Insufficient documentation

## 2017-01-06 DIAGNOSIS — J111 Influenza due to unidentified influenza virus with other respiratory manifestations: Secondary | ICD-10-CM | POA: Diagnosis not present

## 2017-01-06 DIAGNOSIS — R509 Fever, unspecified: Secondary | ICD-10-CM | POA: Diagnosis present

## 2017-01-06 DIAGNOSIS — R69 Illness, unspecified: Secondary | ICD-10-CM

## 2017-01-06 LAB — RAPID STREP SCREEN (MED CTR MEBANE ONLY): STREPTOCOCCUS, GROUP A SCREEN (DIRECT): NEGATIVE

## 2017-01-06 MED ORDER — DEXAMETHASONE 10 MG/ML FOR PEDIATRIC ORAL USE
10.0000 mg | Freq: Once | INTRAMUSCULAR | Status: AC
  Administered 2017-01-06: 10 mg via ORAL
  Filled 2017-01-06: qty 1

## 2017-01-06 MED ORDER — DEXAMETHASONE SODIUM PHOSPHATE 10 MG/ML IJ SOLN
INTRAMUSCULAR | Status: AC
  Filled 2017-01-06: qty 1

## 2017-01-06 MED ORDER — ACETAMINOPHEN 325 MG PO TABS
650.0000 mg | ORAL_TABLET | Freq: Once | ORAL | Status: AC
  Administered 2017-01-06: 650 mg via ORAL

## 2017-01-06 MED ORDER — NAPROXEN 500 MG PO TABS: 500.0000 mg | ORAL_TABLET | Freq: Two times a day (BID) | ORAL | 0 refills | Status: DC

## 2017-01-06 MED ORDER — ACETAMINOPHEN 325 MG PO TABS
ORAL_TABLET | ORAL | Status: AC
  Filled 2017-01-06: qty 2

## 2017-01-06 MED ORDER — FLUTICASONE PROPIONATE 50 MCG/ACT NA SUSP: 2.0000 | Freq: Every day | NASAL | 0 refills | Status: DC

## 2017-01-06 MED ORDER — CETIRIZINE HCL 10 MG PO TABS: 10.0000 mg | ORAL_TABLET | Freq: Every day | ORAL | 1 refills | Status: DC

## 2017-01-06 MED ORDER — BENZONATATE 100 MG PO CAPS: 100.0000 mg | ORAL_CAPSULE | Freq: Three times a day (TID) | ORAL | 0 refills | Status: DC

## 2017-01-06 MED ORDER — IBUPROFEN 800 MG PO TABS
800.0000 mg | ORAL_TABLET | Freq: Once | ORAL | Status: AC
  Administered 2017-01-06: 800 mg via ORAL
  Filled 2017-01-06: qty 1

## 2017-01-06 NOTE — ED Triage Notes (Signed)
Patient complains of body aches, chills , sore throat and earache x 1 week. Taking ibuprofen with minimal relief

## 2017-01-06 NOTE — ED Provider Notes (Signed)
[facility]-EMERGENCY DEPT Provider Note   CSN: [id number] Arrival date & time: 01/06/17  1258     History   Chief Complaint Chief Complaint  Patient presents with  . fever, body aches, chills    HPI Lateya Dauria is a 21 y.o. female.  Laramie Gelles is a 21 y.o. Female who presents to the ED complaining of sore throat, runny nose, postnasal drip, nasal congestion, body aches, coughing and fevers for the past 5 days. Patient reports fevers began 3 days ago. She reports subjective fevers at home. She reports sore throat and pain with swallowing. No difficulty swallowing. She's been taking ibuprofen with some relief. She reports bilateral ear pressure. She did not receive her flu shot this year. Patient denies trouble swallowing, neck pain, chest pain, trouble breathing, wheezing, abdominal pain, nausea, vomiting, diarrhea or rashes.   The history is provided by the patient. No language interpreter was used.    Past Medical History:  Diagnosis Date  . Medical history non-contributory     There are no active problems to display for this patient.   Past Surgical History:  Procedure Laterality Date  . NO PAST SURGERIES      OB History    No data available       Home Medications    Prior to Admission medications   Medication Sig Start Date End Date Taking? Authorizing Provider  benzonatate (TESSALON) 100 MG capsule Take 1 capsule (100 mg total) by mouth every 8 (eight) hours. 01/06/17   Everlene Farrier, PA-C  cetirizine (ZYRTEC ALLERGY) 10 MG tablet Take 1 tablet (10 mg total) by mouth daily. 01/06/17   Everlene Farrier, PA-C  fluticasone (FLONASE) 50 MCG/ACT nasal spray Place 2 sprays into both nostrils daily. 01/06/17   Everlene Farrier, PA-C  naproxen (NAPROSYN) 500 MG tablet Take 1 tablet (500 mg total) by mouth 2 (two) times daily with a meal. 01/06/17   Everlene Farrier, PA-C  ondansetron (ZOFRAN ODT) 8 MG disintegrating tablet Take 1 tablet (8 mg total) by mouth every 8 (eight) hours as  needed for nausea or vomiting. 10/28/16   Armando Reichert, CNM    Family History No family history on file.  Social History Social History  Substance Use Topics  . Smoking status: Never Smoker  . Smokeless tobacco: Never Used  . Alcohol use Yes     Allergies   Patient has no known allergies.   Review of Systems Review of Systems  Constitutional: Positive for chills, fatigue and fever.  HENT: Positive for congestion, ear pain, postnasal drip, rhinorrhea, sneezing and sore throat. Negative for ear discharge, trouble swallowing and voice change.   Eyes: Negative for visual disturbance.  Respiratory: Positive for cough. Negative for shortness of breath and wheezing.   Cardiovascular: Negative for chest pain and palpitations.  Gastrointestinal: Negative for abdominal pain, diarrhea, nausea and vomiting.  Genitourinary: Negative for difficulty urinating, dysuria, frequency, hematuria and urgency.  Musculoskeletal: Positive for myalgias. Negative for neck pain and neck stiffness.  Skin: Negative for rash.  Neurological: Negative for syncope, light-headedness and headaches.     Physical Exam Updated Vital Signs BP 143/84 (BP Location: Left Arm)   Pulse 106   Temp 102.5 F (39.2 C) (Oral)   Resp 24 Comment: crying  Ht 5\' 7"  (1.702 m)   Wt 60.8 kg   SpO2 100%   BMI 20.99 kg/m   Physical Exam  Constitutional: She is oriented to person, place, and time. She appears well-developed and well-nourished. No distress.  Nontoxic appearing.  HENT:  Head: Normocephalic and atraumatic.  Right Ear: External ear normal.  Left Ear: External ear normal.  Mild middle ear effusion bilaterally. No tenderness or loss of landmarks. Boggy nasal turbinates bilaterally. Rhinorrhea present. Mild bilateral tonsillar hypertrophy with slight exudates. Uvula is midline without edema. No peritonsillar abscess. No trismus. No drooling. Temperature vision is normal. Mucous membranes are moist.  Eyes:  Conjunctivae are normal. Pupils are equal, round, and reactive to light. Right eye exhibits no discharge. Left eye exhibits no discharge.  Neck: Neck supple.  Cardiovascular: Normal rate, regular rhythm, normal heart sounds and intact distal pulses.  Exam reveals no gallop and no friction rub.   No murmur heard. Heart rate is 92.  Pulmonary/Chest: Effort normal and breath sounds normal. No respiratory distress. She has no wheezes. She has no rales.  Lungs clear to auscultation bilaterally. No increased work of breathing. No rales or rhonchi.  Abdominal: Soft. There is no tenderness. There is no guarding.  Musculoskeletal: She exhibits no edema.  Lymphadenopathy:    She has no cervical adenopathy.  Neurological: She is alert and oriented to person, place, and time. Coordination normal.  Skin: Skin is warm and dry. Capillary refill takes less than 2 seconds. No rash noted. She is not diaphoretic. No erythema. No pallor.  Psychiatric: She has a normal mood and affect. Her behavior is normal.  Nursing note and vitals reviewed.    ED Treatments / Results  Labs (all labs ordered are listed, but only abnormal results are displayed) Labs Reviewed  RAPID STREP SCREEN (NOT AT [facility])  CULTURE, GROUP A STREP [facility]    EKG  EKG Interpretation None       Radiology No results found.  Procedures Procedures (including critical care time)  Medications Ordered in ED Medications  acetaminophen (TYLENOL) 325 MG tablet (not administered)  dexamethasone (DECADRON) 10 MG/ML injection for Pediatric ORAL use 10 mg (not administered)  ibuprofen (ADVIL,MOTRIN) tablet 800 mg (not administered)  acetaminophen (TYLENOL) tablet 650 mg (650 mg Oral Given 01/06/17 1415)     Initial Impression / Assessment and Plan / ED Course  I have reviewed the triage vital signs and the nursing notes.  Pertinent labs & imaging results that were available during my care of the patient were reviewed by me and  considered in my medical decision making (see chart for details).    This is a 21 y.o. Female who presents to the ED complaining of sore throat, runny nose, postnasal drip, nasal congestion, body aches, coughing and fevers for the past 5 days. Patient reports fevers began 3 days ago. She reports subjective fevers at home. She reports sore throat and pain with swallowing. No difficulty swallowing. She's been taking ibuprofen with some relief. On arrival to the emergency room the patient has a temperature 102.5. On exam the patient is nontoxic appearing. Lungs good auscultation bilaterally. She has no hypoxia, tachypnea or tachycardia. Rhinorrhea is present. Evidence of postnasal drip. Mild bilateral tonsillar hypertrophy. Rapid strep is negative. Patient with influenza-like illness. She is outside of the 48 hour window for Tamiflu. I see no need for chest x-ray at this time as the patient has had no trouble breathing, no shortness of breath, and the lungs are clear to auscultation bilaterally. We'll discharge her with naproxen, Flonase, Zyrtec and Tessalon Perles. I discussed strict and specific return precautions. I discussed the expected course and treatment of influenza. I advised the patient to follow-up with their primary care provider this  week. I advised the patient to return to the emergency department with new or worsening symptoms or new concerns. The patient verbalized understanding and agreement with plan.     Final Clinical Impressions(s) / ED Diagnoses   Final diagnoses:  Influenza-like illness    New Prescriptions New Prescriptions   BENZONATATE (TESSALON) 100 MG CAPSULE    Take 1 capsule (100 mg total) by mouth every 8 (eight) hours.   CETIRIZINE (ZYRTEC ALLERGY) 10 MG TABLET    Take 1 tablet (10 mg total) by mouth daily.   FLUTICASONE (FLONASE) 50 MCG/ACT NASAL SPRAY    Place 2 sprays into both nostrils daily.   NAPROXEN (NAPROSYN) 500 MG TABLET    Take 1 tablet (500 mg total) by  mouth 2 (two) times daily with a meal.     Everlene Farrier, PA-C 01/06/17 1711    Tilden Fossa, MD 01/07/17 1247

## 2017-01-10 LAB — CULTURE, GROUP A STREP (THRC)

## 2017-08-27 ENCOUNTER — Emergency Department (HOSPITAL_COMMUNITY)
Admission: EM | Admit: 2017-08-27 | Discharge: 2017-08-27 | Disposition: A | Discharge status: Home / Self Care | Payer: Medicaid - Out of State

## 2017-08-27 NOTE — ED Notes (Signed)
Called no answer

## 2017-08-27 NOTE — ED Notes (Signed)
Called and no answer.

## 2018-11-10 ENCOUNTER — Other Ambulatory Visit: Payer: Self-pay

## 2018-11-10 ENCOUNTER — Encounter (HOSPITAL_COMMUNITY): Payer: Self-pay

## 2018-11-10 ENCOUNTER — Emergency Department (HOSPITAL_COMMUNITY)
Admission: EM | Admit: 2018-11-10 | Discharge: 2018-11-11 | Disposition: A | Discharge status: Home / Self Care | Payer: Medicaid - Out of State | Attending: Emergency Medicine | Admitting: Emergency Medicine

## 2018-11-10 DIAGNOSIS — Z113 Encounter for screening for infections with a predominantly sexual mode of transmission: Secondary | ICD-10-CM | POA: Insufficient documentation

## 2018-11-10 DIAGNOSIS — N898 Other specified noninflammatory disorders of vagina: Secondary | ICD-10-CM | POA: Insufficient documentation

## 2018-11-10 DIAGNOSIS — R1030 Lower abdominal pain, unspecified: Secondary | ICD-10-CM

## 2018-11-10 DIAGNOSIS — R300 Dysuria: Secondary | ICD-10-CM | POA: Insufficient documentation

## 2018-11-10 DIAGNOSIS — R350 Frequency of micturition: Secondary | ICD-10-CM | POA: Insufficient documentation

## 2018-11-10 DIAGNOSIS — R8290 Unspecified abnormal findings in urine: Secondary | ICD-10-CM | POA: Insufficient documentation

## 2018-11-10 LAB — WET PREP, GENITAL
Clue Cells Wet Prep HPF POC: NONE SEEN
Sperm: NONE SEEN
Trich, Wet Prep: NONE SEEN
YEAST WET PREP: NONE SEEN

## 2018-11-10 LAB — I-STAT BETA HCG BLOOD, ED (MC, WL, AP ONLY)

## 2018-11-10 LAB — URINALYSIS, ROUTINE W REFLEX MICROSCOPIC
Bilirubin Urine: NEGATIVE
Glucose, UA: NEGATIVE mg/dL
HGB URINE DIPSTICK: NEGATIVE
Ketones, ur: NEGATIVE mg/dL
Leukocytes, UA: NEGATIVE
NITRITE: NEGATIVE
Protein, ur: NEGATIVE mg/dL
SPECIFIC GRAVITY, URINE: 1.011 (ref 1.005–1.030)
pH: 6 (ref 5.0–8.0)

## 2018-11-10 MED ORDER — CEFTRIAXONE SODIUM 250 MG IJ SOLR
250.0000 mg | Freq: Once | INTRAMUSCULAR | Status: AC
  Administered 2018-11-10: 250 mg via INTRAMUSCULAR
  Filled 2018-11-10: qty 250

## 2018-11-10 MED ORDER — STERILE WATER FOR INJECTION IJ SOLN
INTRAMUSCULAR | Status: AC
  Administered 2018-11-10: 23:00:00
  Filled 2018-11-10: qty 10

## 2018-11-10 MED ORDER — AZITHROMYCIN 250 MG PO TABS
1000.0000 mg | ORAL_TABLET | Freq: Once | ORAL | Status: AC
  Administered 2018-11-10: 1000 mg via ORAL
  Filled 2018-11-10: qty 4

## 2018-11-10 NOTE — ED Provider Notes (Signed)
[facility] EMERGENCY DEPARTMENT Provider Note   CSN: [id number] Arrival date & time: 11/10/18  2045   History   Chief Complaint Chief Complaint  Patient presents with  . Abdominal Pain    HPI Darlene Ortiz is a 22 y.o. female presents with intermittent suprapubic abdominal pain onset 3 days ago. Patient describes pain as a cramp and states it is nonradiating. Patient states urinating makes the pain worse and resting makes the pain better. Patient denies taking anything for the pain. Patient reports constipation for 2-3 days, but patient states this is baseline for her. Patient states she is concerned about STIs since she was recently sexually active and her condom broke. Patient denies a history of STIs. Patient reports a history of UTIs in the past. Patient reports dysuria, urinary frequency, urinary odor, and vaginal discharge. Patient describes vaginal discharge as thicker, but denies itching. Patient denies hematuria, nausea, vomiting, diarrhea, fever, chills, or night sweats. LMP 10/25/18.   HPI  Past Medical History:  Diagnosis Date  . Medical history non-contributory     There are no active problems to display for this patient.   Past Surgical History:  Procedure Laterality Date  . NO PAST SURGERIES       OB History   None      Home Medications    Prior to Admission medications   Medication Sig Start Date End Date Taking? Authorizing Provider  benzonatate (TESSALON) 100 MG capsule Take 1 capsule (100 mg total) by mouth every 8 (eight) hours. Patient not taking: Reported on 08/27/2017 01/06/17   Everlene Farrier, PA-C  metroNIDAZOLE (FLAGYL) 500 MG tablet Take 1 tablet (500 mg total) by mouth 2 (two) times daily for 7 days. 11/11/18 11/18/18  Leretha Dykes, PA-C    Family History History reviewed. No pertinent family history.  Social History Social History   Tobacco Use  . Smoking status: Never Smoker  . Smokeless tobacco: Never Used    Substance Use Topics  . Alcohol use: Yes    Comment: occassionally  . Drug use: Yes    Types: Marijuana     Allergies   Patient has no known allergies.   Review of Systems Review of Systems  Constitutional: Negative for activity change, appetite change, chills, fever and unexpected weight change.  HENT: Negative for congestion, rhinorrhea and sore throat.   Eyes: Negative for visual disturbance.  Respiratory: Negative for cough and shortness of breath.   Cardiovascular: Negative for chest pain.  Gastrointestinal: Positive for abdominal pain and constipation. Negative for diarrhea, nausea and vomiting.  Endocrine: Negative for polydipsia, polyphagia and polyuria.  Genitourinary: Positive for dysuria, frequency and vaginal discharge. Negative for flank pain, genital sores, hematuria, vaginal bleeding and vaginal pain.  Musculoskeletal: Negative for back pain.  Skin: Negative for rash.  Allergic/Immunologic: Negative for immunocompromised state.  Psychiatric/Behavioral: The patient is not nervous/anxious.      Physical Exam Updated Vital Signs BP (!) 133/92   Pulse 86   Temp 98.3 F (36.8 C) (Oral)   Resp 16   Ht 5\' 7"  (1.702 m)   Wt 61.7 kg   LMP 10/25/2018   SpO2 100%   BMI 21.30 kg/m   Physical Exam  Constitutional: She appears well-developed and well-nourished. No distress.  HENT:  Head: Normocephalic and atraumatic.  Neck: Normal range of motion. Neck supple.  Cardiovascular: Normal rate, regular rhythm and normal heart sounds. Exam reveals no gallop and no friction rub.  No murmur heard. Pulmonary/Chest: Effort  normal and breath sounds normal. No respiratory distress. She has no wheezes. She has no rales.  Abdominal: Soft. Bowel sounds are normal. She exhibits no distension and no mass. There is tenderness in the suprapubic area. There is no rigidity, no rebound, no guarding and no CVA tenderness. No hernia.  Genitourinary: Vagina normal and uterus normal.  Pelvic exam was performed with patient supine. There is no rash, tenderness or lesion on the right labia. There is no rash, tenderness or lesion on the left labia. Cervix exhibits no motion tenderness. Right adnexum displays no mass and no tenderness. Left adnexum displays no mass and no tenderness.  Musculoskeletal: Normal range of motion.  Neurological: She is alert.  Skin: Skin is warm. No rash noted. She is not diaphoretic.  Psychiatric: She has a normal mood and affect.  Nursing note and vitals reviewed.    ED Treatments / Results  Labs (all labs ordered are listed, but only abnormal results are displayed) Labs Reviewed  WET PREP, GENITAL - Abnormal; Notable for the following components:      Result Value   WBC, Wet Prep HPF POC MODERATE (*)    All other components within normal limits  URINALYSIS, ROUTINE W REFLEX MICROSCOPIC - Abnormal; Notable for the following components:   APPearance HAZY (*)    All other components within normal limits  URINE CULTURE  HIV ANTIBODY (ROUTINE TESTING W REFLEX)  I-STAT BETA HCG BLOOD, ED ([facility], [facility], [facility] ONLY)  GC/CHLAMYDIA PROBE AMP ([place]) NOT AT [facility]    EKG None  Radiology No results found.  Procedures Procedures (including critical care time)  Medications Ordered in ED Medications  cefTRIAXone (ROCEPHIN) injection 250 mg (250 mg Intramuscular Given 11/10/18 2322)  azithromycin (ZITHROMAX) tablet 1,000 mg (1,000 mg Oral Given 11/10/18 2322)  sterile water (preservative free) injection (  Given 11/10/18 2326)     Initial Impression / Assessment and Plan / ED Course  I have reviewed the triage vital signs and the nursing notes.  Pertinent labs & imaging results that were available during my care of the patient were reviewed by me and considered in my medical decision making (see chart for details).  Clinical Course as of Nov 11 25  Wynelle Link Nov 10, 2018  2337 Moderate WBCs noted on wet prep.   WBC, Wet Prep HPF POC(!): MODERATE  [AH]    Clinical Course User Index [AH] Carlyle Basques P, PA-C   Pt presents with concerns for possible STD and urinary tract infection.  UA does not reveal signs of UTI, but will order urine culture since patient has symptoms. Pt understands that they have GC/Chlamydia cultures pending and that they will need to inform all sexual partners if results return positive. Pt has been treated prophylactically with azithromycin and Rocephin due to pts history, pelvic exam, and wet prep with increased WBCs.  Pt not concerning for PID because hemodynamically stable and no cervical motion tenderness on pelvic exam. Pt has also been treated with Flagyl for Bacterial Vaginosis. Pt has been advised to not drink alcohol while on this medication.  Patient to be discharged with instructions to follow up with OBGYN/PCP. Discussed importance of using protection when sexually active.    Final Clinical Impressions(s) / ED Diagnoses   Final diagnoses:  Lower abdominal pain    ED Discharge Orders         Ordered    metroNIDAZOLE (FLAGYL) 500 MG tablet  2 times daily     11/11/18 0026  Carlyle BasquesHernandez, Stepfon Rawles ChevalP, [place] JerseyPA-C 11/11/18 0044    Nira Connardama, Pedro Eduardo, MD 11/11/18 [phone]

## 2018-11-10 NOTE — ED Triage Notes (Signed)
Pt from home w/ a c/o lower abd pain for the past 3 days. Pain is intermittent, sharp, and "crampy." Pt reports dysuria, polyuria, strong odor, but no hematuria. Pt reports recent sexual intercourse and would like to have STI and pregnancy testing. She states the condom tore during sex. No N/V/D.

## 2018-11-11 LAB — HIV ANTIBODY (ROUTINE TESTING W REFLEX): HIV Screen 4th Generation wRfx: NONREACTIVE

## 2018-11-11 LAB — GC/CHLAMYDIA PROBE AMP (~~LOC~~) NOT AT ARMC
Chlamydia: NEGATIVE
Neisseria Gonorrhea: NEGATIVE

## 2018-11-11 MED ORDER — METRONIDAZOLE 500 MG PO TABS: 500.0000 mg | ORAL_TABLET | Freq: Two times a day (BID) | ORAL | 0 refills | Status: AC

## 2018-11-11 NOTE — Discharge Instructions (Addendum)
You have been seen today for abdominal pain, STD concerns.  Please read and follow all provided instructions.   1. Medications: metronidazole (antibiotic), usual home medications 2. Treatment: rest, drink plenty of fluids 3. Follow Up: Please follow up with your primary doctor in 7 days for discussion of your diagnoses and further evaluation after today's visit; if you do not have a primary care doctor use the resource guide provided to find one; Please return to the ER for any new or worsening symptoms. Please obtain all of your results from medical records or have your doctors office obtain the results - share them with your doctor - you should be seen at your doctors office. Call today to arrange your follow up.   Take medications as prescribed. Please review all of the medicines and only take them if you do not have an allergy to them. Return to the emergency room for worsening condition or new concerning symptoms. Follow up with your regular doctor. If you don't have a regular doctor use one of the numbers below to establish a primary care doctor.  Please be aware that if you are taking birth control pills, taking other prescriptions, ESPECIALLY ANTIBIOTICS may make the birth control ineffective - if this is the case, either do not engage in sexual activity or use alternative methods of birth control such as condoms until you have finished the medicine and your family doctor says it is OK to restart them. If you are on a blood thinner such as COUMADIN, be aware that any other medicine that you take may cause the coumadin to either work too much, or not enough - you should have your coumadin level rechecked in next 7 days if this is the case.  ?  It is also a possibility that you have an allergic reaction to any of the medicines that you have been prescribed - Everybody reacts differently to medications and while MOST people have no trouble with most medicines, you may have a reaction such as nausea,  vomiting, rash, swelling, shortness of breath. If this is the case, please stop taking the medicine immediately and contact your physician.  ?  You should return to the ER if you develop severe or worsening symptoms.   Emergency Department Resource Guide 1) Find a Doctor and Pay Out of Pocket Although you won't have to find out who is covered by your insurance plan, it is a good idea to ask around and get recommendations. You will then need to call the office and see if the doctor you have chosen will accept you as a new patient and what types of options they offer for patients who are self-pay. Some doctors offer discounts or will set up payment plans for their patients who do not have insurance, but you will need to ask so you aren't surprised when you get to your appointment.  2) Contact Your Local Health Department Not all health departments have doctors that can see patients for sick visits, but many do, so it is worth a call to see if yours does. If you don't know where your local health department is, you can check in your phone book. The CDC also has a tool to help you locate your state's health department, and many state websites also have listings of all of their local health departments.  3) Find a Walk-in Clinic If your illness is not likely to be very severe or complicated, you may want to try a walk in clinic. These  are popping up all over the country in pharmacies, drugstores, and shopping centers. They're usually staffed by nurse practitioners or physician assistants that have been trained to treat common illnesses and complaints. They're usually fairly quick and inexpensive. However, if you have serious medical issues or chronic medical problems, these are probably not your best option.  No Primary Care Doctor: Call Health Connect at  [phone] - they can help you locate a primary care doctor that  accepts your insurance, provides certain services, etc. Physician Referral [phone]  Emergency Department Resource Guide 1) Find a Doctor and Pay Out of Pocket Although you won't have to find out who is covered by your insurance plan, it is a good idea to ask around and get recommendations. You will then need to call the office and see if the doctor you have chosen will accept you as a new patient and what types of options they offer for patients who are self-pay. Some doctors offer discounts or will set up payment plans for their patients who do not have insurance, but you will need to ask so you aren't surprised when you get to your appointment.  2) Contact Your Local Health Department Not all health departments have doctors that can see patients for sick visits, but many do, so it is worth a call to see if yours does. If you don't know where your local health department is, you can check in your phone book. The CDC also has a tool to help you locate your state's health department, and many state websites also have listings of all of their local health departments.  3) Find a [facility] If your illness is not likely to be very severe or complicated, you may want to try a walk in clinic. These are popping up all over the country in pharmacies, drugstores, and shopping centers. They're usually staffed by nurse practitioners or physician assistants that have been trained to treat common illnesses and complaints. They're usually fairly quick and inexpensive. However, if you have serious medical issues or chronic medical problems, these are probably not your best option.  No Primary Care Doctor: Call Health Connect at  [phone] - they can help you locate a primary care doctor that  accepts your insurance, provides certain services, etc. Physician Referral Service- [phone]  Chronic Pain Problems: Organization         Address  Phone   Notes  [facility]  [phone] Patients need to be referred by their primary care doctor.    Medication Assistance: Organization         Address  Phone   Notes  [facility] Medication [facility] [address] [place], [place] [address] [phone] --Must be a resident of [facility] -- Must have NO insurance coverage whatsoever (no Medicaid/ Medicare, etc.) -- The pt. MUST have a primary care doctor that directs their care regularly and follows them in the community   [facility]  [phone]   [employer]  [phone]    Agencies that provide inexpensive medical care: Organization         Address  Phone   Notes  [place]  [phone]   [facility]    [phone]   [facility] [place] [place],  [phone]   [place] [address] [place] [phone]   Planned Parenthood    (  [phone]   [facility]    [phone]   [facility]  [address], [place] Phone:  [phone], Fax:  [phone] Hours of Operation:  9 am - 6 pm, M-F.  Also accepts Medicaid/Medicare and self-pay.  [facility] for [place] [address], [address], [place] Phone: [phone], Fax: [phone]. Hours of Operation:  8:30 am - 5:30 pm, M-F.  Also accepts Medicaid and self-pay.  [facility] [place] [address], [place] Phone: [phone]   [place], [place], [place] [phone], Ext. [phone] Mondays & Thursdays: 7-9 AM.  First 15 patients are seen on a first come, first serve basis.    [place] Providers:  Organization         Address  Phone   Notes  [facility] [address], [place] [phone] Also accepts self-pay patients.  [facility] [address], [place]  [phone]   [place], [address], [place] [phone]   [facility] [address] [place] [phone]   Lucianne Lei [address], [place]   [phone] Only accepts [place] [facility] [place] patients after they have their name applied to their card.   Self-Pay (no insurance) in [facility]:  Organization         Address  Phone   Notes  Sickle Cell Patients, [facility] [place] [phone]   [facility] [place] [phone]   [facility] [place]  [place], [place], [place] [phone]   [facility]/Dr. Osei-Bonsu  [address], [place] or [address], [place] [phone] Phone number for both [place] and [place] locations is the same.  [facility] and [facility] [address], [place] [phone]   [facility] [address] [place] or [address] [phone]   [facility] [address] [place] [phone], phone; [phone], fax Sees patients 1st and 3rd Saturday of every month.  Must not qualify for public or private insurance (i.e. Medicaid, Medicare, [place] Health Choice, Veterans' Benefits)  Household income should be no more than 200% of the poverty level The clinic cannot treat you if you are pregnant or think you are pregnant  Sexually transmitted diseases are not treated at the clinic.

## 2018-11-12 LAB — URINE CULTURE: Culture: 70000 — AB

## 2018-11-13 ENCOUNTER — Telehealth: Payer: Self-pay | Admitting: Emergency Medicine

## 2018-11-13 NOTE — Telephone Encounter (Signed)
Post ED Visit - Positive Culture Follow-up  Culture report reviewed by antimicrobial stewardship pharmacist:  []  Enzo BiNathan Batchelder, Pharm.D. []  Celedonio MiyamotoJeremy Frens, Pharm.D., BCPS AQ-ID []  Garvin FilaMike Maccia, Pharm.D., BCPS []  Georgina PillionElizabeth Martin, Pharm.D., BCPS []  BenldMinh Pham, [address] BoulevardPharm.D., BCPS, AAHIVP []  Estella HuskMichelle Turner, Pharm.D., BCPS, AAHIVP [x]  Lysle Pearlachel Rumbarger, PharmD, BCPS []  Phillips Climeshuy Dang, PharmD, BCPS []  Agapito GamesAlison Masters, PharmD, BCPS []  Verlan FriendsErin Deja, PharmD  Positive *urine culture Treated with Metronidazole, organism sensitive to the same and no further patient follow-up is required at this time.  Carollee HerterShannon Sadler Teschner 11/13/2018, 1:49 PM

## 2019-03-05 ENCOUNTER — Other Ambulatory Visit: Payer: Self-pay

## 2019-03-05 ENCOUNTER — Ambulatory Visit (HOSPITAL_COMMUNITY)
Admission: EM | Admit: 2019-03-05 | Discharge: 2019-03-05 | Disposition: A | Discharge status: Home / Self Care | Payer: Managed Care, Other (non HMO) | Attending: Family Medicine | Admitting: Family Medicine

## 2019-03-05 ENCOUNTER — Encounter (HOSPITAL_COMMUNITY): Payer: Self-pay

## 2019-03-05 DIAGNOSIS — H6123 Impacted cerumen, bilateral: Secondary | ICD-10-CM

## 2019-03-05 NOTE — Discharge Instructions (Addendum)
We removed the wax from your ears and there is no signs of infection Follow up as needed for continued or worsening symptoms

## 2019-03-05 NOTE — ED Notes (Signed)
Patient verbalizes understanding of discharge instructions. Opportunity for questioning and answers were provided. Patient discharged from [facility] by provider.  

## 2019-03-05 NOTE — ED Triage Notes (Signed)
Pt presents with ear fullness in and irritation to both ears.

## 2019-03-05 NOTE — ED Provider Notes (Signed)
[facility]    CSN: [id number] Arrival date & time: 03/05/19  1403     History   Chief Complaint Chief Complaint  Patient presents with  . Cerumen Impaction    HPI Darlene Ortiz is a 23 y.o. female.   Patient is a 23 year old female that presents with bilateral ear fullness.  This is been present and worsening for the past 3 days.  She has had some decreased hearing in both ears.  Mild pain in the right ear.  Denies any associated fevers, chills, night sweats, cough, congestion, rhinorrhea, dizziness or headaches.  She is not taking thing for symptoms.    ROS per HPI      Past Medical History:  Diagnosis Date  . Medical history non-contributory     There are no active problems to display for this patient.   Past Surgical History:  Procedure Laterality Date  . NO PAST SURGERIES      OB History   No obstetric history on file.      Home Medications    Prior to Admission medications   Not on File    Family History History reviewed. No pertinent family history.  Social History Social History   Tobacco Use  . Smoking status: Never Smoker  . Smokeless tobacco: Never Used  Substance Use Topics  . Alcohol use: Yes    Comment: occassionally  . Drug use: Yes    Types: Marijuana     Allergies   Patient has no known allergies.   Review of Systems Review of Systems   Physical Exam Triage Vital Signs ED Triage Vitals  Enc Vitals Group     BP 03/05/19 1423 122/82     Pulse Rate 03/05/19 1423 81     Resp 03/05/19 1423 18     Temp 03/05/19 1423 98 F (36.7 C)     Temp Source 03/05/19 1423 Oral     SpO2 03/05/19 1423 100 %     Weight --      Height --      Head Circumference --      Peak Flow --      Pain Score 03/05/19 1424 4     Pain Loc --      Pain Edu? --      Excl. in GC? --    No data found.  Updated Vital Signs BP 122/82 (BP Location: Left Arm)   Pulse 81   Temp 98 F (36.7 C) (Oral)   Resp 18   LMP 03/02/2019    SpO2 100%   Visual Acuity Right Eye Distance:   Left Eye Distance:   Bilateral Distance:    Right Eye Near:   Left Eye Near:    Bilateral Near:     Physical Exam Vitals signs and nursing note reviewed.  Constitutional:      Appearance: Normal appearance.  HENT:     Head: Normocephalic and atraumatic.     Right Ear: There is impacted cerumen.     Left Ear: There is impacted cerumen.     Nose: Nose normal.     Mouth/Throat:     Pharynx: Oropharynx is clear.  Eyes:     Conjunctiva/sclera: Conjunctivae normal.  Neck:     Musculoskeletal: Normal range of motion.  Pulmonary:     Effort: Pulmonary effort is normal.  Musculoskeletal: Normal range of motion.  Skin:    General: Skin is warm and dry.  Neurological:     Mental Status:  She is alert.  Psychiatric:        Mood and Affect: Mood normal.      UC Treatments / Results  Labs (all labs ordered are listed, but only abnormal results are displayed) Labs Reviewed - No data to display  EKG None  Radiology No results found.  Procedures Procedures (including critical care time)  Medications Ordered in UC Medications - No data to display  Initial Impression / Assessment and Plan / UC Course  I have reviewed the triage vital signs and the nursing notes.  Pertinent labs & imaging results that were available during my care of the patient were reviewed by me and considered in my medical decision making (see chart for details).     Bilateral cerumen impaction causing decreased hearing and fullness in ears Ears cleaned out with lavage in clinic Patient feeling much better.  Rechecked and no signs of infection present. Final diagnoses:  Hearing loss due to cerumen impaction, bilateral     Discharge Instructions     We removed the wax from your ears and there is no signs of infection Follow up as needed for continued or worsening symptoms     ED Prescriptions    None     Controlled Substance Prescriptions  [place] Controlled Substance Registry consulted? Not Applicable   Janace Aris, NP 03/05/19 1502

## 2019-07-22 ENCOUNTER — Emergency Department (HOSPITAL_COMMUNITY)
Admission: EM | Admit: 2019-07-22 | Discharge: 2019-07-22 | Disposition: A | Discharge status: Home / Self Care | Payer: Managed Care, Other (non HMO) | Attending: Emergency Medicine | Admitting: Emergency Medicine

## 2019-07-22 ENCOUNTER — Other Ambulatory Visit: Payer: Self-pay

## 2019-07-22 DIAGNOSIS — M79601 Pain in right arm: Secondary | ICD-10-CM | POA: Insufficient documentation

## 2019-07-22 DIAGNOSIS — N898 Other specified noninflammatory disorders of vagina: Secondary | ICD-10-CM | POA: Insufficient documentation

## 2019-07-22 LAB — URINALYSIS, ROUTINE W REFLEX MICROSCOPIC
Bilirubin Urine: NEGATIVE
Glucose, UA: NEGATIVE mg/dL
Hgb urine dipstick: NEGATIVE
Ketones, ur: NEGATIVE mg/dL
Leukocytes,Ua: NEGATIVE
Nitrite: NEGATIVE
Protein, ur: NEGATIVE mg/dL
Specific Gravity, Urine: 1.023 (ref 1.005–1.030)
pH: 6 (ref 5.0–8.0)

## 2019-07-22 NOTE — ED Provider Notes (Signed)
[facility] EMERGENCY DEPARTMENT Provider Note   CSN: [id number] Arrival date & time: 07/22/19  1433    History   Chief Complaint Chief Complaint  Patient presents with  . Arm Pain    HPI Brihana Quickel is a 23 y.o. female.     23 y.o female with no PMH presents to the ED with two complaints. Patient reports she began having right arm pain yesterday after trying to lift her boyfriend.  She reports the pain starts at her right shoulder and radiates into her elbow and arm, she reports this pain is worse with movement.  She has not tried any medical therapy for relieving symptoms.  Her second complaint is vaginal odor, reports she is had foul odor to her vagina for the past 2 weeks, she also endorses some itching.  She does not have any vaginal discharge or vaginal bleeding.  Denies any previous history of any STIs, her last menstrual period was July 05, 2019.  She has not tried any medication for relieving symptoms.  Denies any abdominal pain, urinary symptoms, nausea vomiting or fevers.  The history is provided by the patient.  Arm Pain    Past Medical History:  Diagnosis Date  . Medical history non-contributory     There are no active problems to display for this patient.   Past Surgical History:  Procedure Laterality Date  . NO PAST SURGERIES       OB History   No obstetric history on file.      Home Medications    Prior to Admission medications   Not on File    Family History No family history on file.  Social History Social History   Tobacco Use  . Smoking status: Never Smoker  . Smokeless tobacco: Never Used  Substance Use Topics  . Alcohol use: Yes    Comment: occassionally  . Drug use: Yes    Types: Marijuana     Allergies   Patient has no known allergies.   Review of Systems Review of Systems  Constitutional: Negative for fever.  Genitourinary: Positive for vaginal discharge (No discharge but odor.).   Musculoskeletal: Positive for arthralgias and myalgias.     Physical Exam Updated Vital Signs BP 118/79 (BP Location: Right Arm)   Pulse 80   Temp 98.6 F (37 C) (Oral)   Resp 20   LMP 07/05/2019 (Exact Date)   SpO2 100%   Physical Exam Vitals signs and nursing note reviewed.  Constitutional:      General: She is not in acute distress.    Appearance: She is well-developed.  HENT:     Head: Normocephalic and atraumatic.     Mouth/Throat:     Pharynx: No oropharyngeal exudate.  Eyes:     Pupils: Pupils are equal, round, and reactive to light.  Neck:     Musculoskeletal: Normal range of motion.  Cardiovascular:     Rate and Rhythm: Regular rhythm.     Heart sounds: Normal heart sounds.  Pulmonary:     Effort: Pulmonary effort is normal. No respiratory distress.     Breath sounds: Normal breath sounds.  Abdominal:     General: Bowel sounds are normal. There is no distension.     Palpations: Abdomen is soft.     Tenderness: There is no abdominal tenderness.  Musculoskeletal:        General: No tenderness or deformity.     Right lower leg: No edema.     Left  lower leg: No edema.     Comments: Pulses present, capillary refill is intact.  Patient has full range of motion of the right shoulder, no obvious deformities.  Strength is 5 out of 5 with extension, flexion at the shoulder, elbow, hand.  Skin:    General: Skin is warm and dry.  Neurological:     Mental Status: She is alert and oriented to person, place, and time.      ED Treatments / Results  Labs (all labs ordered are listed, but only abnormal results are displayed) Labs Reviewed  URINALYSIS, ROUTINE W REFLEX MICROSCOPIC  GC/CHLAMYDIA PROBE AMP ([place]) NOT AT [facility]    EKG None  Radiology No results found.  Procedures Procedures (including critical care time)  Medications Ordered in ED Medications - No data to display   Initial Impression / Assessment and Plan / ED Course  I have reviewed  the triage vital signs and the nursing notes.  Pertinent labs & imaging results that were available during my care of the patient were reviewed by me and considered in my medical decision making (see chart for details).      Patient with no pertinent past medical history presents to the ED with 2 complaints, first 1 being right arm pain which began yesterday, after heavy lifting.  Has not tried any medication for relieving symptoms.  Her second complaint entails vaginal odor, denies any discharge, bleeding, rashes or sores.  During primary valuation patient is non-ill-appearing, boyfriend is at the bedside, she is laughing and playing on her cell phone.  When asked about gynecological care, patient reports she does not have anyone and utilizes the emergency room for her checkups.  When given the offered to give her a referral for primary care, patient reports she does not need this as she will return to the ED for her care.  Right shoulder appears neurovascularly intact, no obvious deformity, has full range of motion on my exam.  No imaging will be ordered at this time.  We will try rice therapy on patient.  Second complaint which entails vaginal odor without any vaginal discharge, will have patient self swab as she deferred her pelvic exam at this time.  We will also obtain a UA to screen for any urinary tract infection, she does deny dysuria, hematuria or fevers or abdominal pain.   UA showed no nitrites, leukocytes, denies any urinary symptoms at this time.  Encouraged to follow-up with OB/GYN, women's clinic referral attached to her chart.  GC was sent, low suspicion for any urinary chlamydia she reports no vaginal discharge, no changes in color, no pain with coitus.  Will have patient follow-up in an outpatient basis.  She was encouraged to use ice, heat, anti-inflammatories to help with her right arm pain.  Return precautions discussed at length.  Portions of this note were generated with  Scientist, clinical (histocompatibility and immunogenetics)Dragon dictation software. Dictation errors may occur despite best attempts at proofreading.  Final Clinical Impressions(s) / ED Diagnoses   Final diagnoses:  Right arm pain  Vaginal odor    ED Discharge Orders    None       Claude MangesSoto, Koa Palla, PA-C 07/22/19 1725    Virgina NorfolkCuratolo, Adam, DO 07/22/19 2358

## 2019-07-22 NOTE — ED Notes (Signed)
Patient verbalizes understanding of discharge instructions. Opportunity for questioning and answers were provided. Armband removed by staff, pt discharged from ED ambulatory.   

## 2019-07-22 NOTE — ED Triage Notes (Signed)
Pt here for evaluation of R arm pain, from shoulder to wrist since doing some heavy lifting yesterday. Pt has full range of motion. Has not taken OTC medication PTA. Endorses vaginal odor and itching. Sts she has unprotected sex frequently but with only one partner. Denies discharge or pain.

## 2019-07-22 NOTE — Discharge Instructions (Addendum)
You may alternate ibuprofen or Tylenol to help with your right shoulder pain.  You may also apply heat to the area.  A referral to the women's clinic was attached to your chart, please schedule an appointment for further gynecological evaluations.

## 2019-07-24 LAB — GC/CHLAMYDIA PROBE AMP (~~LOC~~) NOT AT ARMC
Chlamydia: NEGATIVE
Neisseria Gonorrhea: NEGATIVE

## 2019-08-28 ENCOUNTER — Other Ambulatory Visit: Payer: Self-pay

## 2019-08-28 ENCOUNTER — Emergency Department (HOSPITAL_COMMUNITY)
Admission: EM | Admit: 2019-08-28 | Discharge: 2019-08-28 | Disposition: A | Discharge status: Home / Self Care | Payer: Medicaid - Out of State | Attending: Emergency Medicine | Admitting: Emergency Medicine

## 2019-08-28 ENCOUNTER — Encounter (HOSPITAL_COMMUNITY): Payer: Self-pay

## 2019-08-28 ENCOUNTER — Emergency Department (HOSPITAL_COMMUNITY): Payer: Medicaid - Out of State

## 2019-08-28 DIAGNOSIS — M25571 Pain in right ankle and joints of right foot: Secondary | ICD-10-CM

## 2019-08-28 DIAGNOSIS — W19XXXA Unspecified fall, initial encounter: Secondary | ICD-10-CM

## 2019-08-28 MED ORDER — ACETAMINOPHEN 325 MG PO TABS
650.0000 mg | ORAL_TABLET | Freq: Once | ORAL | Status: AC
  Administered 2019-08-28: 650 mg via ORAL
  Filled 2019-08-28: qty 2
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
## 2019-08-28 NOTE — ED Notes (Signed)
Patient transported to X-ray 
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
## 2019-08-28 NOTE — Discharge Instructions (Signed)
Please rest, ice, and elevate your ankle to reduce the swelling You may take 600 mg Ibuprofen every 8 hours as well as Tylenol 1,000 mg every 8 hours as needed (it is recommended to take Ibuprofen then 4 hours later take Tylenol then 4 hours later repeat process) Use brace and crutches as needed for comfort Please follow up with Dr. Griffin Basil with sports medicine for further evaluation if no improvement of your ankle pain by the beginning of next week

## 2019-08-28 NOTE — Progress Notes (Signed)
Orthopedic Tech Progress Note Patient Details:  Darlene Ortiz [birth year] [id number]  Ortho Devices Type of Ortho Device: ASO, Crutches Ortho Device/Splint Location: right Ortho Device/Splint Interventions: Application   Post Interventions Patient Tolerated: Well Instructions Provided: Care of device   [place] Pink 08/28/2019, 11:40 AM

## 2019-08-28 NOTE — ED Notes (Signed)
Ortho tech paged  

## 2019-12-16 ENCOUNTER — Encounter (HOSPITAL_COMMUNITY): Payer: Self-pay | Admitting: Emergency Medicine

## 2019-12-16 ENCOUNTER — Emergency Department (HOSPITAL_COMMUNITY)
Admission: EM | Admit: 2019-12-16 | Discharge: 2019-12-16 | Disposition: A | Discharge status: Home / Self Care | Payer: Medicaid - Out of State | Attending: Emergency Medicine | Admitting: Emergency Medicine

## 2019-12-16 DIAGNOSIS — L539 Erythematous condition, unspecified: Secondary | ICD-10-CM | POA: Diagnosis present

## 2019-12-16 DIAGNOSIS — T8090XA Unspecified complication following infusion and therapeutic injection, initial encounter: Secondary | ICD-10-CM

## 2019-12-16 DIAGNOSIS — R7611 Nonspecific reaction to tuberculin skin test without active tuberculosis: Secondary | ICD-10-CM | POA: Insufficient documentation

## 2019-12-16 NOTE — ED Triage Notes (Signed)
Pt arrives to ED with a tiny red bump on her forearm. She got a TB skin test last Tuesday for a job but did not go back after 2 days to have it read. Pt is concerned due to the small bump on her arm.

## 2019-12-16 NOTE — Discharge Instructions (Signed)
As discussed, your TB test is negative. You just had a little bit of an injection site reaction which is normal. Follow-up with your PCP if area worsens. Return to the ER for new or worsening symptoms.

## 2019-12-16 NOTE — ED Provider Notes (Signed)
[facility] EMERGENCY DEPARTMENT Provider Note   CSN: [id number] Arrival date & time: 12/16/19  1275     History Chief Complaint  Patient presents with  . Cyst    Darlene Ortiz is a 24 y.o. female with no significant past medical history who presents to the ED due to an erythematous area that appeared on her right forearm on Sunday.  Patient states she had a TB skin test performed last Tuesday for a new job, but noticed a small red bump on her forearm where the injection was this Sunday. Patient is concerned that it is a positive TB test. Patient denies tenderness to palpation around area. No spreading of redness or induration. Patient denies cough, night sweats, chest pain, shortness of breath, and weight loss. Patient denies fever and chills. Patient denies history of HIV, recent contact with TB, organ transplant, IVDU, and other immunocompromised states.     Past Medical History:  Diagnosis Date  . Medical history non-contributory     There are no problems to display for this patient.   Past Surgical History:  Procedure Laterality Date  . NO PAST SURGERIES       OB History   No obstetric history on file.     No family history on file.  Social History   Tobacco Use  . Smoking status: Never Smoker  . Smokeless tobacco: Never Used  Substance Use Topics  . Alcohol use: Yes    Comment: occassionally  . Drug use: Yes    Types: Marijuana    Home Medications Prior to Admission medications   Not on File    Allergies    Patient has no known allergies.  Review of Systems   Review of Systems  Constitutional: Negative for chills and fever.  Respiratory: Negative for cough and shortness of breath.   Cardiovascular: Negative for chest pain.  Skin: Positive for color change.    Physical Exam Updated Vital Signs BP 127/80 (BP Location: Left Arm)   Pulse 80   Temp 98.4 F (36.9 C) (Oral)   Resp 14   SpO2 100%   Physical Exam Vitals and  nursing note reviewed.  Constitutional:      General: She is not in acute distress.    Appearance: She is not ill-appearing.  HENT:     Head: Normocephalic.  Eyes:     Pupils: Pupils are equal, round, and reactive to light.  Cardiovascular:     Rate and Rhythm: Normal rate and regular rhythm.     Pulses: Normal pulses.     Heart sounds: Normal heart sounds. No murmur. No friction rub. No gallop.   Pulmonary:     Effort: Pulmonary effort is normal.     Breath sounds: Normal breath sounds.  Abdominal:     General: Abdomen is flat. Bowel sounds are normal. There is no distension.     Palpations: Abdomen is soft.  Musculoskeletal:     Cervical back: Neck supple.  Skin:    Comments: Small 1mm area of erythema and induration on right forearm with no tenderness. See photo below  Neurological:     General: No focal deficit present.     Mental Status: She is alert.       ED Results / Procedures / Treatments   Labs (all labs ordered are listed, but only abnormal results are displayed) Labs Reviewed - No data to display  EKG None  Radiology No results found.  Procedures Procedures (including critical care  time)  Medications Ordered in ED Medications - No data to display  ED Course  I have reviewed the triage vital signs and the nursing notes.  Pertinent labs & imaging results that were available during my care of the patient were reviewed by me and considered in my medical decision making (see chart for details).    MDM Rules/Calculators/A&P                      24 year old female presents to the ED due to an erythematous 39mm area of induration on her right forearm that first appeared on Sunday after receiving TB test 1 week ago. Patient denies history of HIV, recent contact with TB, organ transplant, IVDU, and other immunocompromised states. Patient denies associative symptoms of cough, night sweats, weight loss, hemoptysis, chest pain, and shortness of breath. Vitals  all within normal limits. Patient in no acute distress and non-ill appearing. Suspect small injection site reaction. Negative TB test. Discussed results with patient. Patient advised to follow-up with PCP if area gets larger. Strict ED precautions discussed with patient. Patient states understanding and agrees to plan. Patient discharged home in no acute distress and stable vitals  Final Clinical Impression(s) / ED Diagnoses Final diagnoses:  Injection site reaction, initial encounter    Rx / DC Orders ED Discharge Orders    None       Jesusita Oka 12/16/19 0938    Virgina Norfolk, DO 12/16/19 1205

## 2020-01-04 ENCOUNTER — Emergency Department (HOSPITAL_COMMUNITY)
Admission: EM | Admit: 2020-01-04 | Discharge: 2020-01-04 | Disposition: A | Discharge status: Home / Self Care | Payer: Medicaid - Out of State | Attending: Emergency Medicine | Admitting: Emergency Medicine

## 2020-01-04 ENCOUNTER — Encounter (HOSPITAL_COMMUNITY): Payer: Self-pay | Admitting: Emergency Medicine

## 2020-01-04 ENCOUNTER — Other Ambulatory Visit: Payer: Self-pay

## 2020-01-04 DIAGNOSIS — H01001 Unspecified blepharitis right upper eyelid: Secondary | ICD-10-CM | POA: Insufficient documentation

## 2020-01-04 DIAGNOSIS — H5711 Ocular pain, right eye: Secondary | ICD-10-CM | POA: Diagnosis present

## 2020-01-04 MED ORDER — TETRACAINE HCL 0.5 % OP SOLN
2.0000 [drp] | Freq: Once | OPHTHALMIC | Status: AC
  Administered 2020-01-04: 2 [drp] via OPHTHALMIC
  Filled 2020-01-04: qty 4

## 2020-01-04 MED ORDER — FLUORESCEIN SODIUM 1 MG OP STRP
1.0000 | ORAL_STRIP | Freq: Once | OPHTHALMIC | Status: AC
  Administered 2020-01-04: 1 via OPHTHALMIC
  Filled 2020-01-04: qty 1

## 2020-01-04 MED ORDER — ERYTHROMYCIN 5 MG/GM OP OINT: TOPICAL_OINTMENT | OPHTHALMIC | 0 refills | Status: DC

## 2020-01-04 NOTE — ED Provider Notes (Signed)
[facility] EMERGENCY DEPARTMENT Provider Note   CSN: [id number] Arrival date & time: 01/04/20  1824     History Chief Complaint  Patient presents with  . Eye Pain    Darlene Ortiz is a 24 y.o. female without significant past medical history who presents to the emergency department with complaints of right eyelid irritation since yesterday.  Patient states her right eyelid is somewhat painful, swollen, and irritated.  He had some crusty drainage this morning.  No alleviating or aggravating factors.  The eye itself is not painful.  She is not a contact lens wearer.  She denies blurry/double vision, fever, chills, rhinorrhea, ear pain, sore throat, or dyspnea.  No recent eye injury.  HPI     Past Medical History:  Diagnosis Date  . Medical history non-contributory     There are no problems to display for this patient.   Past Surgical History:  Procedure Laterality Date  . NO PAST SURGERIES       OB History   No obstetric history on file.     No family history on file.  Social History   Tobacco Use  . Smoking status: Never Smoker  . Smokeless tobacco: Never Used  Substance Use Topics  . Alcohol use: Yes    Comment: occassionally  . Drug use: Yes    Types: Marijuana    Home Medications Prior to Admission medications   Not on File    Allergies    Patient has no known allergies.  Review of Systems   Review of Systems  Constitutional: Negative for chills and fever.  Eyes: Positive for discharge. Negative for visual disturbance.       Positive eyelid irritation/swelling.  Respiratory: Negative for shortness of breath.   Cardiovascular: Negative for chest pain.  Gastrointestinal: Negative for abdominal pain and vomiting.   Physical Exam Updated Vital Signs BP 119/72 (BP Location: Right Arm)   Pulse 85   Temp 98.4 F (36.9 C) (Oral)   Resp 16   LMP 11/09/2019   SpO2 100%   Physical Exam Vitals and nursing note reviewed.   Constitutional:      General: She is not in acute distress.    Appearance: She is well-developed.  HENT:     Head: Normocephalic and atraumatic.     Nose: Nose normal.  Eyes:     General: Lids are everted, no foreign bodies appreciated. Vision grossly intact. Gaze aligned appropriately.        Right eye: No foreign body or discharge.        Left eye: No foreign body or discharge.     Extraocular Movements: Extraocular movements intact.     Conjunctiva/sclera: Conjunctivae normal.     Pupils: Pupils are equal, round, and reactive to light.     Comments: No periorbital erythema, swelling, or tenderness to palpation.  No proptosis.  No consensual photophobia. Visual acuity: Right eye 20/16 Left eye 20/16 Both eyes 20/16 R eye: Mild swelling and minimal erythema to the R upper lid. No abscess.  Right eye fluorescein stain: No uptake, no abrasion/ulceration, no dendritic staining, no hyphema or hypopyon.   Cardiovascular:     Rate and Rhythm: Normal rate.  Pulmonary:     Effort: Pulmonary effort is normal.  Musculoskeletal:     Cervical back: Neck supple.  Neurological:     Mental Status: She is alert.     Comments: Clear speech.   Psychiatric:  Behavior: Behavior normal.        Thought Content: Thought content normal.     ED Results / Procedures / Treatments   Labs (all labs ordered are listed, but only abnormal results are displayed) Labs Reviewed - No data to display  EKG None  Radiology No results found.  Procedures Procedures (including critical care time)  Medications Ordered in ED Medications  fluorescein ophthalmic strip 1 strip (1 strip Right Eye Given 01/04/20 1942)  tetracaine (PONTOCAINE) 0.5 % ophthalmic solution 2 drop (2 drops Right Eye Given 01/04/20 1942)    ED Course  I have reviewed the triage vital signs and the nursing notes.  Pertinent labs & imaging results that were available during my care of the patient were reviewed by me and  considered in my medical decision making (see chart for details).    MDM Rules/Calculators/A&P                      Patient presents to the emergency department with complaints of right eyelid irritation.  Nontoxic, resting comfortably, vitals WNL.There is no fluorescein uptake on exam, no indication of corneal abrasion/ulceration or HSV. Patient is afebrile and without proptosis, entrapment, or consensual photophobia, no periorbital swelling- doubt periorbital or orbital cellulitis.  No conjunctival injection, eye itself is not painful, no consensual photophobia, do not suspect iritis.  No obvious hordeolum. No significant visual acuity deficit. Presentation seems consistent w/ blepharitis. Will treat with supportive care & erythromycin ointment. I discussed treatment plan, need for follow-up, and return precautions with the patient. Provided opportunity for questions, patient confirmed understanding and is in agreement with plan.   Final Clinical Impression(s) / ED Diagnoses Final diagnoses:  Blepharitis of right upper eyelid, unspecified type    Rx / DC Orders ED Discharge Orders         Ordered    erythromycin ophthalmic ointment     01/04/20 2024           Francesco Provencal, Glynda Jaeger, PA-C 01/04/20 2027    Deno Etienne, DO 01/04/20 2246

## 2020-01-04 NOTE — Discharge Instructions (Addendum)
You were seen in the emergency department today for eyelid irritation.  We suspect that you have blepharitis, this is inflammation/irritation of the eyelid.  We are sending you home with erythromycin ophthalmic ointment, please apply to the upper lid 4 times per day for the next 1 week.  In addition we would like you to apply warm compresses for 15 minutes 4 times per day.  Avoid using eye make-up.    Follow-up with primary care at your ophthalmology within 3 days for reevaluation.  Return to the emergency department for new or worsening symptoms including but not limited to increased pain, pain to the eye itself, vision difficulty, fever, pain/swelling around the eye, or any other concerns.

## 2020-01-04 NOTE — ED Notes (Signed)
Visual acuity test complete 85ft equivalent: Right eye 20/16 Left eye 20/16 Both eyes 20/16

## 2020-01-04 NOTE — ED Triage Notes (Signed)
Patient reports right eye lid pain onset of yesterday and woke up this morning right upper eye lid is swollen. Reports some crust on eye last night.

## 2020-04-07 ENCOUNTER — Encounter (HOSPITAL_COMMUNITY): Payer: Self-pay | Admitting: Obstetrics and Gynecology

## 2020-04-07 ENCOUNTER — Other Ambulatory Visit: Payer: Self-pay

## 2020-04-07 ENCOUNTER — Inpatient Hospital Stay (HOSPITAL_COMMUNITY)
Admission: AD | Admit: 2020-04-07 | Discharge: 2020-04-07 | Disposition: A | Discharge status: Home / Self Care | Payer: Medicaid - Out of State | Attending: Obstetrics and Gynecology | Admitting: Obstetrics and Gynecology

## 2020-04-07 ENCOUNTER — Inpatient Hospital Stay (HOSPITAL_COMMUNITY): Payer: Medicaid - Out of State

## 2020-04-07 DIAGNOSIS — B9689 Other specified bacterial agents as the cause of diseases classified elsewhere: Secondary | ICD-10-CM | POA: Insufficient documentation

## 2020-04-07 DIAGNOSIS — O208 Other hemorrhage in early pregnancy: Secondary | ICD-10-CM | POA: Insufficient documentation

## 2020-04-07 DIAGNOSIS — O418X10 Other specified disorders of amniotic fluid and membranes, first trimester, not applicable or unspecified: Secondary | ICD-10-CM

## 2020-04-07 DIAGNOSIS — O209 Hemorrhage in early pregnancy, unspecified: Secondary | ICD-10-CM

## 2020-04-07 DIAGNOSIS — R109 Unspecified abdominal pain: Secondary | ICD-10-CM | POA: Diagnosis present

## 2020-04-07 DIAGNOSIS — O98811 Other maternal infectious and parasitic diseases complicating pregnancy, first trimester: Secondary | ICD-10-CM

## 2020-04-07 DIAGNOSIS — Z3491 Encounter for supervision of normal pregnancy, unspecified, first trimester: Secondary | ICD-10-CM

## 2020-04-07 DIAGNOSIS — O23591 Infection of other part of genital tract in pregnancy, first trimester: Secondary | ICD-10-CM | POA: Diagnosis not present

## 2020-04-07 DIAGNOSIS — Z3A01 Less than 8 weeks gestation of pregnancy: Secondary | ICD-10-CM | POA: Insufficient documentation

## 2020-04-07 DIAGNOSIS — B3749 Other urogenital candidiasis: Secondary | ICD-10-CM

## 2020-04-07 DIAGNOSIS — O210 Mild hyperemesis gravidarum: Secondary | ICD-10-CM | POA: Insufficient documentation

## 2020-04-07 DIAGNOSIS — N760 Acute vaginitis: Secondary | ICD-10-CM

## 2020-04-07 DIAGNOSIS — O468X1 Other antepartum hemorrhage, first trimester: Secondary | ICD-10-CM

## 2020-04-07 DIAGNOSIS — Z3A00 Weeks of gestation of pregnancy not specified: Secondary | ICD-10-CM

## 2020-04-07 DIAGNOSIS — O219 Vomiting of pregnancy, unspecified: Secondary | ICD-10-CM

## 2020-04-07 DIAGNOSIS — O26891 Other specified pregnancy related conditions, first trimester: Secondary | ICD-10-CM

## 2020-04-07 LAB — COMPREHENSIVE METABOLIC PANEL
ALT: 11 U/L (ref 0–44)
AST: 15 U/L (ref 15–41)
Albumin: 3.6 g/dL (ref 3.5–5.0)
Alkaline Phosphatase: 41 U/L (ref 38–126)
Anion gap: 11 (ref 5–15)
BUN: 5 mg/dL — ABNORMAL LOW (ref 6–20)
CO2: 23 mmol/L (ref 22–32)
Calcium: 9 mg/dL (ref 8.9–10.3)
Chloride: 100 mmol/L (ref 98–111)
Creatinine, Ser: 0.6 mg/dL (ref 0.44–1.00)
GFR calc Af Amer: >60 mL/min (ref >60)
GFR calc non Af Amer: >60 mL/min (ref >60)
Glucose, Bld: 95 mg/dL (ref 70–99)
Potassium: 3.4 mmol/L — ABNORMAL LOW (ref 3.5–5.1)
Sodium: 134 mmol/L — ABNORMAL LOW (ref 135–145)
Total Bilirubin: 0.5 mg/dL (ref 0.3–1.2)
Total Protein: 6.6 g/dL (ref 6.5–8.1)

## 2020-04-07 LAB — CBC
HCT: 32.6 % — ABNORMAL LOW (ref 36.0–46.0)
Hemoglobin: 10.1 g/dL — ABNORMAL LOW (ref 12.0–15.0)
MCH: 26.1 pg (ref 26.0–34.0)
MCHC: 31 g/dL (ref 30.0–36.0)
MCV: 84.2 fL (ref 80.0–100.0)
Platelets: 343 10*3/uL (ref 150–400)
RBC: 3.87 MIL/uL (ref 3.87–5.11)
RDW: 15.9 % — ABNORMAL HIGH (ref 11.5–15.5)
WBC: 4.1 10*3/uL (ref 4.0–10.5)
nRBC: 0 % (ref 0.0–0.2)

## 2020-04-07 LAB — HIV ANTIBODY (ROUTINE TESTING W REFLEX): HIV Screen 4th Generation wRfx: NONREACTIVE

## 2020-04-07 LAB — URINALYSIS, ROUTINE W REFLEX MICROSCOPIC
Bilirubin Urine: NEGATIVE
Glucose, UA: NEGATIVE mg/dL
Hgb urine dipstick: NEGATIVE
Ketones, ur: NEGATIVE mg/dL
Leukocytes,Ua: NEGATIVE
Nitrite: NEGATIVE
Protein, ur: NEGATIVE mg/dL
Specific Gravity, Urine: 1.021 (ref 1.005–1.030)
pH: 6 (ref 5.0–8.0)

## 2020-04-07 LAB — ABO/RH: ABO/RH(D): A POS

## 2020-04-07 LAB — RPR: RPR Ser Ql: NONREACTIVE

## 2020-04-07 LAB — LIPASE, BLOOD: Lipase: 20 U/L (ref 11–51)

## 2020-04-07 LAB — I-STAT BETA HCG BLOOD, ED (MC, WL, AP ONLY): I-stat hCG, quantitative: >2000 m[IU]/mL — ABNORMAL HIGH (ref <5)

## 2020-04-07 LAB — WET PREP, GENITAL
Sperm: NONE SEEN
Trich, Wet Prep: NONE SEEN
Yeast Wet Prep HPF POC: NONE SEEN

## 2020-04-07 LAB — HCG, QUANTITATIVE, PREGNANCY: hCG, Beta Chain, Quant, S: 56754 m[IU]/mL — ABNORMAL HIGH (ref <5)

## 2020-04-07 MED ORDER — METRONIDAZOLE 0.75 % VA GEL: 1.0000 | Freq: Two times a day (BID) | VAGINAL | 0 refills | Status: DC

## 2020-04-07 MED ORDER — PROMETHAZINE HCL 25 MG PO TABS: 25.0000 mg | ORAL_TABLET | Freq: Four times a day (QID) | ORAL | 0 refills | Status: DC | PRN

## 2020-04-07 NOTE — MAU Provider Note (Signed)
Chief Complaint: Abdominal Pain and Emesis   First Provider Initiated Contact with Patient 04/07/20 1016     SUBJECTIVE HPI: Darlene Ortiz is a 24 y.o. G1P0 at Unknown who presents to M[facility] Admissions reporting abdominal cramping & nausea. Symptoms started last week. Reports intermittent lower abdominal cramping that at times makes her nausea worse. Has vomited once since last week. Currently denies nausea. Also feels like she has some urinary urgency at times. Thought that she had a UTI, which is what brought her to [facility] today; in the ED she had a positive pregnancy test. Patient did not know she was pregnant.  Denies fever/chills, diarrhea, constipation, vaginal bleeding, or vaginal discharge.   Location: abdomen Quality: cramping Severity: 9/10 on pain scale Duration: 6 days Timing: intermittent Modifying factors: none Associated signs and symptoms: nausea  Past Medical History:  Diagnosis Date  . Medical history non-contributory    OB History  Gravida Para Term Preterm AB Living  1            SAB TAB Ectopic Multiple Live Births               # Outcome Date GA Lbr Len/2nd Weight Sex Delivery Anes PTL Lv  1 Current            Past Surgical History:  Procedure Laterality Date  . NO PAST SURGERIES     Social History   Socioeconomic History  . Marital status: Single    Spouse name: Not on file  . Number of children: Not on file  . Years of education: Not on file  . Highest education level: Not on file  Occupational History  . Not on file  Tobacco Use  . Smoking status: Never Smoker  . Smokeless tobacco: Never Used  Substance and Sexual Activity  . Alcohol use: Yes    Comment: occassionally  . Drug use: Yes    Types: Marijuana    Comment: May 2021  . Sexual activity: Yes    Birth control/protection: None  Other Topics Concern  . Not on file  Social History Narrative  . Not on file   Social Determinants of Health   Financial Resource Strain:   .  Difficulty of Paying Living Expenses:   Food Insecurity:   . Worried About Charity fundraiser in the Last Year:   . Arboriculturist in the Last Year:   Transportation Needs:   . Film/video editor (Medical):   Marland Kitchen Lack of Transportation (Non-Medical):   Physical Activity:   . Days of Exercise per Week:   . Minutes of Exercise per Session:   Stress:   . Feeling of Stress :   Social Connections:   . Frequency of Communication with Friends and Family:   . Frequency of Social Gatherings with Friends and Family:   . Attends Religious Services:   . Active Member of Clubs or Organizations:   . Attends Archivist Meetings:   Marland Kitchen Marital Status:   Intimate Partner Violence:   . Fear of Current or Ex-Partner:   . Emotionally Abused:   Marland Kitchen Physically Abused:   . Sexually Abused:    History reviewed. No pertinent family history. No current facility-administered medications on file prior to encounter.   No current outpatient medications on file prior to encounter.   No Known Allergies  I have reviewed patient's Past Medical Hx, Surgical Hx, Family Hx, Social Hx, medications and allergies.   Review of Systems  Constitutional:  Negative.   Gastrointestinal: Positive for abdominal pain, nausea and vomiting. Negative for constipation and diarrhea.  Genitourinary: Positive for urgency. Negative for dysuria, flank pain, frequency, hematuria, vaginal bleeding and vaginal discharge.    OBJECTIVE Patient Vitals for the past 24 hrs:  BP Temp Temp src Pulse Resp SpO2  04/07/20 0941 125/84 98.8 F (37.1 C) Oral 83 18 100 %  04/07/20 0847 127/76 -- -- 79 16 100 %  04/07/20 0800 -- -- -- -- -- 100 %  04/07/20 0730 127/81 98.5 F (36.9 C) Oral 82 14 100 %   Constitutional: Well-developed, well-nourished female in no acute distress.  Cardiovascular: normal rate & rhythm, no murmur Respiratory: normal rate and effort. Lung sounds clear throughout GI: Abd soft, non-tender, Pos BS x 4.  No guarding or rebound tenderness MS: Extremities nontender, no edema, normal ROM Neurologic: Alert and oriented x 4.     LAB RESULTS Results for orders placed or performed during the hospital encounter of 04/07/20 (from the past 24 hour(s))  Urinalysis, Routine w reflex microscopic     Status: None   Collection Time: 04/07/20  8:03 AM  Result Value Ref Range   Color, Urine YELLOW YELLOW   APPearance CLEAR CLEAR   Specific Gravity, Urine 1.021 1.005 - 1.030   pH 6.0 5.0 - 8.0   Glucose, UA NEGATIVE NEGATIVE mg/dL   Hgb urine dipstick NEGATIVE NEGATIVE   Bilirubin Urine NEGATIVE NEGATIVE   Ketones, ur NEGATIVE NEGATIVE mg/dL   Protein, ur NEGATIVE NEGATIVE mg/dL   Nitrite NEGATIVE NEGATIVE   Leukocytes,Ua NEGATIVE NEGATIVE  I-Stat beta hCG blood, ED     Status: Abnormal   Collection Time: 04/07/20  8:14 AM  Result Value Ref Range   I-stat hCG, quantitative >2,000.0 (H) <5 mIU/mL   Comment 3          HIV Antibody (routine testing w rflx)     Status: None   Collection Time: 04/07/20  8:15 AM  Result Value Ref Range   HIV Screen 4th Generation wRfx Non Reactive Non Reactive  Lipase, blood     Status: None   Collection Time: 04/07/20  8:41 AM  Result Value Ref Range   Lipase 20 11 - 51 U/L  Comprehensive metabolic panel     Status: Abnormal   Collection Time: 04/07/20  8:41 AM  Result Value Ref Range   Sodium 134 (L) 135 - 145 mmol/L   Potassium 3.4 (L) 3.5 - 5.1 mmol/L   Chloride 100 98 - 111 mmol/L   CO2 23 22 - 32 mmol/L   Glucose, Bld 95 70 - 99 mg/dL   BUN 5 (L) 6 - 20 mg/dL   Creatinine, Ser 6.31 0.44 - 1.00 mg/dL   Calcium 9.0 8.9 - 49.7 mg/dL   Total Protein 6.6 6.5 - 8.1 g/dL   Albumin 3.6 3.5 - 5.0 g/dL   AST 15 15 - 41 U/L   ALT 11 0 - 44 U/L   Alkaline Phosphatase 41 38 - 126 U/L   Total Bilirubin 0.5 0.3 - 1.2 mg/dL   GFR calc non Af Amer >60 >60 mL/min   GFR calc Af Amer >60 >60 mL/min   Anion gap 11 5 - 15  CBC     Status: Abnormal   Collection Time:  04/07/20  8:41 AM  Result Value Ref Range   WBC 4.1 4.0 - 10.5 K/uL   RBC 3.87 3.87 - 5.11 MIL/uL   Hemoglobin 10.1 (L) 12.0 - 15.0  g/dL   HCT 06.2 (L) 37.6 - 28.3 %   MCV 84.2 80.0 - 100.0 fL   MCH 26.1 26.0 - 34.0 pg   MCHC 31.0 30.0 - 36.0 g/dL   RDW 15.1 (H) 76.1 - 60.7 %   Platelets 343 150 - 400 K/uL   nRBC 0.0 0.0 - 0.2 %  hCG, quantitative, pregnancy     Status: Abnormal   Collection Time: 04/07/20  8:41 AM  Result Value Ref Range   hCG, Beta Chain, Quant, S 56,754 (H) <5 mIU/mL  ABO/Rh     Status: None   Collection Time: 04/07/20  8:41 AM  Result Value Ref Range   ABO/RH(D) A POS    No rh immune globuloin      NOT A RH IMMUNE GLOBULIN CANDIDATE, PT RH POSITIVE Performed at [facility], [address], [place], [place] [address]   Wet prep, genital     Status: Abnormal   Collection Time: 04/07/20 10:39 AM   Specimen: Vaginal  Result Value Ref Range   Yeast Wet Prep HPF POC NONE SEEN NONE SEEN   Trich, Wet Prep NONE SEEN NONE SEEN   Clue Cells Wet Prep HPF POC PRESENT (A) NONE SEEN   WBC, Wet Prep HPF POC MODERATE (A) NONE SEEN   Sperm NONE SEEN     IMAGING US OB LESS THAN 14 WEEKS WITH OB TRANSVAGINAL  Result Date: 04/07/2020 CLINICAL DATA:  Pelvic pain EXAM: OBSTETRIC <14 WK [place] AND TRANSVAGINAL OB US TECHNIQUE: Both transabdominal and transvaginal ultrasound examinations were performed for complete evaluation of the gestation as well as the maternal uterus, adnexal regions, and pelvic cul-de-sac. Transvaginal technique was performed to assess early pregnancy. COMPARISON:  None. FINDINGS: Intrauterine gestational sac: Visualized Yolk sac:  Visualized Embryo:  Visualized Cardiac Activity: Visualized Heart Rate: 116 bpm CRL:  5 mm   6 w   1 d                  [place] [facility]: November 30, 2020 Subchorionic hemorrhage: There is a subchorionic hemorrhage measuring 1.3 x 0.4 cm. Maternal uterus/adnexae: Cervical os is closed. Right ovary measures 2.8 x 2.3 x 1.6 cm. Left ovary  measures 2.5 x 3.3 x 3.2 cm. There is no extrauterine pelvic mass. No appreciable free fluid. IMPRESSION: Single live intrauterine gestation with estimated gestational age of approximately 6 weeks. 1.3 x 0.4 cm subchorionic hemorrhage noted. No extrauterine pelvic mass or appreciable free fluid. Electronically Signed   By: Bretta Bang III M.D.   On: 04/07/2020 11:22    MAU COURSE Orders Placed This Encounter  Procedures  . Wet prep, genital  . US OB LESS THAN 14 WEEKS WITH OB TRANSVAGINAL  . Lipase, blood  . Comprehensive metabolic panel  . CBC  . Urinalysis, Routine w reflex microscopic  . RPR  . HIV Antibody (routine testing w rflx)  . hCG, quantitative, pregnancy  . Diet NPO time specified  . Saline Lock IV, Maintain IV access  . I-Stat beta hCG blood, ED  . ABO/Rh  . Discharge patient   Meds ordered this encounter  Medications  . promethazine (PHENERGAN) 25 MG tablet    Sig: Take 1 tablet (25 mg total) by mouth every 6 (six) hours as needed for nausea or vomiting.    Dispense:  30 tablet    Refill:  0    Order Specific Question:   Supervising Provider    Answer:   ERVIN, MICHAEL L [[id number]]  .  metroNIDAZOLE (METROGEL VAGINAL) 0.75 % vaginal gel    Sig: Place 1 Applicatorful vaginally 2 (two) times daily.    Dispense:  70 g    Refill:  0    Order Specific Question:   Supervising Provider    Answer:   ERVIN, MICHAEL L [[id number]]    MDM +UPT UA, wet prep, GC/chlamydia, CBC, ABO/Rh, quant hCG, and [place] today to rule out ectopic pregnancy which can be life threatening.   Ultrasound shows live IUP measuring [redacted]w[redacted]d & a [facility]  RH positive  Wet prep shows clue cells. Patient reports some abnormal discharge. Will tx for BV with metrogel since patient has difficulty swallowing pills.   ASSESSMENT 1. Abdominal pain during pregnancy in first trimester   2. Normal IUP (intrauterine pregnancy) on prenatal ultrasound, first trimester   3. [redacted] weeks gestation of pregnancy   4.  Subchorionic hematoma in first trimester, single or unspecified fetus   5. Nausea and vomiting during pregnancy prior to [redacted] weeks gestation   6. Bacterial vaginosis     PLAN Discharge home in stable condition. Rx reglan & metrogel GC/CT pending Discussed reasons to return to MAU   Allergies as of 04/07/2020   No Known Allergies     Medication List    TAKE these medications   metroNIDAZOLE 0.75 % vaginal gel Commonly known as: METROGEL VAGINAL Place 1 Applicatorful vaginally 2 (two) times daily.   promethazine 25 MG tablet Commonly known as: PHENERGAN Take 1 tablet (25 mg total) by mouth every 6 (six) hours as needed for nausea or vomiting.        [name], NP 04/07/2020  12:45 PM

## 2020-04-07 NOTE — ED Triage Notes (Signed)
Pt reports bil lower abd pain for 1 week with emesis. States pain has been progressively getting worse. Denies fever.

## 2020-04-07 NOTE — Discharge Instructions (Signed)
Subchorionic Hematoma  A subchorionic hematoma is a gathering of blood between the outer wall of the embryo (chorion) and the inner wall of the womb (uterus). This condition can cause vaginal bleeding. If they cause little or no vaginal bleeding, early small hematomas usually shrink on their own and do not affect your baby or pregnancy. When bleeding starts later in pregnancy, or if the hematoma is larger or occurs in older pregnant women, the condition may be more serious. Larger hematomas may get bigger, which increases the chances of miscarriage. This condition also increases the risk of:  Premature separation of the placenta from the uterus.  Premature (preterm) labor.  Stillbirth. What are the causes? The exact cause of this condition is not known. It occurs when blood is trapped between the placenta and the uterine wall because the placenta has separated from the original site of implantation. What increases the risk? You are more likely to develop this condition if:  You were treated with fertility medicines.  You conceived through in vitro fertilization (IVF). What are the signs or symptoms? Symptoms of this condition include:  Vaginal spotting or bleeding.  Contractions of the uterus. These cause abdominal pain. Sometimes you may have no symptoms and the bleeding may only be seen when ultrasound images are taken (transvaginal ultrasound). How is this diagnosed? This condition is diagnosed based on a physical exam. This includes a pelvic exam. You may also have other tests, including:  Blood tests.  Urine tests.  Ultrasound of the abdomen. How is this treated? Treatment for this condition can vary. Treatment may include:  Watchful waiting. You will be monitored closely for any changes in bleeding. During this stage: ? The hematoma may be reabsorbed by the body. ? The hematoma may separate the fluid-filled space containing the embryo (gestational sac) from the wall of the  womb (endometrium).  Medicines.  Activity restriction. This may be needed until the bleeding stops. Follow these instructions at home:  Stay on bed rest if told to do so by your health care provider.  Do not lift anything that is heavier than 10 lbs. (4.5 kg) or as told by your health care provider.  Do not use any products that contain nicotine or tobacco, such as cigarettes and e-cigarettes. If you need help quitting, ask your health care provider.  Track and write down the number of pads you use each day and how soaked (saturated) they are.  Do not use tampons.  Keep all follow-up visits as told by your health care provider. This is important. Your health care provider may ask you to have follow-up blood tests or ultrasound tests or both. Contact a health care provider if:  You have any vaginal bleeding.  You have a fever. Get help right away if:  You have severe cramps in your stomach, back, abdomen, or pelvis.  You pass large clots or tissue. Save any tissue for your health care provider to look at.  You have more vaginal bleeding, and you faint or become lightheaded or weak. Summary  A subchorionic hematoma is a gathering of blood between the outer wall of the placenta and the uterus.  This condition can cause vaginal bleeding.  Sometimes you may have no symptoms and the bleeding may only be seen when ultrasound images are taken.  Treatment may include watchful waiting, medicines, or activity restriction. This information is not intended to replace advice given to you by your health care provider. Make sure you discuss any questions you   have with your health care provider. Document Revised: 11/02/2017 Document Reviewed: 01/16/2017 Elsevier Patient Education  2020 [employer] Inc. Morning Sickness  Morning sickness is when a woman feels nauseous during pregnancy. This nauseous feeling may or may not come with vomiting. It often occurs in the morning, but it can be a  problem at any time of day. Morning sickness is most common during the first trimester. In some cases, it may continue throughout pregnancy. Although morning sickness is unpleasant, it is usually harmless unless the woman develops severe and continual vomiting (hyperemesis gravidarum), a condition that requires more intense treatment. What are the causes? The exact cause of this condition is not known, but it seems to be related to normal hormonal changes that occur in pregnancy. What increases the risk? You are more likely to develop this condition if:  You experienced nausea or vomiting before your pregnancy.  You had morning sickness during a previous pregnancy.  You are pregnant with more than one baby, such as twins. What are the signs or symptoms? Symptoms of this condition include:  Nausea.  Vomiting. How is this diagnosed? This condition is usually diagnosed based on your signs and symptoms. How is this treated? In many cases, treatment is not needed for this condition. Making some changes to what you eat may help to control symptoms. Your health care provider may also prescribe or recommend:  Vitamin B6 supplements.  Anti-nausea medicines.  Ginger. Follow these instructions at home: Medicines  Take over-the-counter and prescription medicines only as told by your health care provider. Do not use any prescription, over-the-counter, or herbal medicines for morning sickness without first talking with your health care provider.  Taking multivitamins before getting pregnant can prevent or decrease the severity of morning sickness in most women. Eating and drinking  Eat a piece of dry toast or crackers before getting out of bed in the morning.  Eat 5 or 6 small meals a day.  Eat dry and bland foods, such as rice or a baked potato. Foods that are high in carbohydrates are often helpful.  Avoid greasy, fatty, and spicy foods.  Have someone cook for you if the smell of any  food causes nausea and vomiting.  If you feel nauseous after taking prenatal vitamins, take the vitamins at night or with a snack.  Snack on protein foods between meals if you are hungry. Nuts, yogurt, and cheese are good options.  Drink fluids throughout the day.  Try ginger ale made with real ginger, ginger tea made from fresh grated ginger, or ginger candies. General instructions  Do not use any products that contain nicotine or tobacco, such as cigarettes and e-cigarettes. If you need help quitting, ask your health care provider.  Get an air purifier to keep the air in your house free of odors.  Get plenty of fresh air.  Try to avoid odors that trigger your nausea.  Consider trying these methods to help relieve symptoms: ? Wearing an acupressure wristband. These wristbands are often worn for seasickness. ? Acupuncture. Contact a health care provider if:  Your home remedies are not working and you need medicine.  You feel dizzy or light-headed.  You are losing weight. Get help right away if:  You have persistent and uncontrolled nausea and vomiting.  You faint.  You have severe pain in your abdomen. Summary  Morning sickness is when a woman feels nauseous during pregnancy. This nauseous feeling may or may not come with vomiting.  Morning sickness is  most common during the first trimester.  It often occurs in the morning, but it can be a problem at any time of day.  In many cases, treatment is not needed for this condition. Making some changes to what you eat may help to control symptoms. This information is not intended to replace advice given to you by your health care provider. Make sure you discuss any questions you have with your health care provider. Document Revised: 11/02/2017 Document Reviewed: 12/23/2016 Elsevier Patient Education  2020 Elsevier Inc.     Bacterial Vaginosis  Bacterial vaginosis is a vaginal infection that occurs when the normal  balance of bacteria in the vagina is disrupted. It results from an overgrowth of certain bacteria. This is the most common vaginal infection among women ages 50-44. Because bacterial vaginosis increases your risk for STIs (sexually transmitted infections), getting treated can help reduce your risk for chlamydia, gonorrhea, herpes, and HIV (human immunodeficiency virus). Treatment is also important for preventing complications in pregnant women, because this condition can cause an early (premature) delivery. What are the causes? This condition is caused by an increase in harmful bacteria that are normally present in small amounts in the vagina. However, the reason that the condition develops is not fully understood. What increases the risk? The following factors may make you more likely to develop this condition:  Having a new sexual partner or multiple sexual partners.  Having unprotected sex.  Douching.  Having an intrauterine device (IUD).  Smoking.  Drug and alcohol abuse.  Taking certain antibiotic medicines.  Being pregnant. You cannot get bacterial vaginosis from toilet seats, bedding, swimming pools, or contact with objects around you. What are the signs or symptoms? Symptoms of this condition include:  Grey or white vaginal discharge. The discharge can also be watery or foamy.  A fish-like odor with discharge, especially after sexual intercourse or during menstruation.  Itching in and around the vagina.  Burning or pain with urination. Some women with bacterial vaginosis have no signs or symptoms. How is this diagnosed? This condition is diagnosed based on:  Your medical history.  A physical exam of the vagina.  Testing a sample of vaginal fluid under a microscope to look for a large amount of bad bacteria or abnormal cells. Your health care provider may use a cotton swab or a small wooden spatula to collect the sample. How is this treated? This condition is treated  with antibiotics. These may be given as a pill, a vaginal cream, or a medicine that is put into the vagina (suppository). If the condition comes back after treatment, a second round of antibiotics may be needed. Follow these instructions at home: Medicines  Take over-the-counter and prescription medicines only as told by your health care provider.  Take or use your antibiotic as told by your health care provider. Do not stop taking or using the antibiotic even if you start to feel better. General instructions  If you have a female sexual partner, tell her that you have a vaginal infection. She should see her health care provider and be treated if she has symptoms. If you have a female sexual partner, he does not need treatment.  During treatment: ? Avoid sexual activity until you finish treatment. ? Do not douche. ? Avoid alcohol as directed by your health care provider. ? Avoid breastfeeding as directed by your health care provider.  Drink enough water and fluids to keep your urine clear or pale yellow.  Keep the area around  your vagina and rectum clean. ? Wash the area daily with warm water. ? Wipe yourself from front to back after using the toilet.  Keep all follow-up visits as told by your health care provider. This is important. How is this prevented?  Do not douche.  Wash the outside of your vagina with warm water only.  Use protection when having sex. This includes latex condoms and dental dams.  Limit how many sexual partners you have. To help prevent bacterial vaginosis, it is best to have sex with just one partner (monogamous).  Make sure you and your sexual partner are tested for STIs.  Wear cotton or cotton-lined underwear.  Avoid wearing tight pants and pantyhose, especially during summer.  Limit the amount of alcohol that you drink.  Do not use any products that contain nicotine or tobacco, such as cigarettes and e-cigarettes. If you need help quitting, ask your  health care provider.  Do not use illegal drugs. Where to find more information  Centers for Disease Control and Prevention: AppraiserFraud.fi  American Sexual Health Association (ASHA): www.ashastd.org  U.S. Department of Health and Financial controller, Office on Women's Health: DustingSprays.pl or SecuritiesCard.it Contact a health care provider if:  Your symptoms do not improve, even after treatment.  You have more discharge or pain when urinating.  You have a fever.  You have pain in your abdomen.  You have pain during sex.  You have vaginal bleeding between periods. Summary  Bacterial vaginosis is a vaginal infection that occurs when the normal balance of bacteria in the vagina is disrupted.  Because bacterial vaginosis increases your risk for STIs (sexually transmitted infections), getting treated can help reduce your risk for chlamydia, gonorrhea, herpes, and HIV (human immunodeficiency virus). Treatment is also important for preventing complications in pregnant women, because the condition can cause an early (premature) delivery.  This condition is treated with antibiotic medicines. These may be given as a pill, a vaginal cream, or a medicine that is put into the vagina (suppository). This information is not intended to replace advice given to you by your health care provider. Make sure you discuss any questions you have with your health care provider. Document Revised: 11/02/2017 Document Reviewed: 08/05/2016 Elsevier Patient Education  2020 [employer].

## 2020-04-07 NOTE — MAU Note (Signed)
Presents with c/o bilateral lower abdominal pain.  Denies VB.  LMP in March, lasted approximately 2 weeks.

## 2020-04-07 NOTE — ED Provider Notes (Signed)
MSE was initiated and I personally evaluated the patient and placed orders (if any) at  8:58 AM on Apr 07, 2020.  The patient appears stable so that the remainder of the MSE may be completed by another provider.  24 year old female who presents emergency department with about 1 week of lower abdominal pain.  Denies vaginal bleeding.  She has been having "motion sickness" for a couple of weeks.  She is unsure of her last menstrual.  She denies vomiting.  She denies fever or chills.  Her work-up shows that she is currently pregnant.  Will transfer to the MAU for further work-up.  Is otherwise hemodynamically stable.  Patient informed of her pregnancy status.   Arthor Captain, PA-C 04/07/20 0900    Sabas Sous, MD 04/11/20 1140

## 2020-04-07 NOTE — Progress Notes (Signed)
Wet prep and GC/Chlamydia cultures done by this RN.

## 2020-04-07 NOTE — ED Notes (Signed)
Report given to MAU nurse 

## 2020-04-08 ENCOUNTER — Encounter (HOSPITAL_COMMUNITY): Payer: Self-pay | Admitting: Obstetrics and Gynecology

## 2020-04-08 ENCOUNTER — Inpatient Hospital Stay (HOSPITAL_COMMUNITY)
Admission: AD | Admit: 2020-04-08 | Discharge: 2020-04-08 | Disposition: A | Discharge status: Home / Self Care | Payer: Medicaid - Out of State | Attending: Obstetrics and Gynecology | Admitting: Obstetrics and Gynecology

## 2020-04-08 ENCOUNTER — Other Ambulatory Visit: Payer: Self-pay

## 2020-04-08 DIAGNOSIS — R110 Nausea: Secondary | ICD-10-CM | POA: Insufficient documentation

## 2020-04-08 DIAGNOSIS — Z3A01 Less than 8 weeks gestation of pregnancy: Secondary | ICD-10-CM | POA: Diagnosis not present

## 2020-04-08 DIAGNOSIS — O26891 Other specified pregnancy related conditions, first trimester: Secondary | ICD-10-CM | POA: Diagnosis not present

## 2020-04-08 DIAGNOSIS — O468X1 Other antepartum hemorrhage, first trimester: Secondary | ICD-10-CM

## 2020-04-08 DIAGNOSIS — O208 Other hemorrhage in early pregnancy: Secondary | ICD-10-CM | POA: Diagnosis not present

## 2020-04-08 DIAGNOSIS — R109 Unspecified abdominal pain: Secondary | ICD-10-CM | POA: Diagnosis not present

## 2020-04-08 DIAGNOSIS — O418X10 Other specified disorders of amniotic fluid and membranes, first trimester, not applicable or unspecified: Secondary | ICD-10-CM | POA: Insufficient documentation

## 2020-04-08 DIAGNOSIS — O4690 Antepartum hemorrhage, unspecified, unspecified trimester: Secondary | ICD-10-CM

## 2020-04-08 DIAGNOSIS — O4691 Antepartum hemorrhage, unspecified, first trimester: Secondary | ICD-10-CM

## 2020-04-08 LAB — GC/CHLAMYDIA PROBE AMP (~~LOC~~) NOT AT ARMC
Chlamydia: NEGATIVE
Comment: NEGATIVE
Comment: NORMAL
Neisseria Gonorrhea: NEGATIVE

## 2020-04-08 NOTE — MAU Note (Signed)
Started having cramping and heavy bleeding this morning

## 2020-04-08 NOTE — MAU Provider Note (Signed)
History     CSN: [id number]  Arrival date and time: 04/08/20 1341   First Provider Initiated Contact with Patient 04/08/20 1448      Chief Complaint  Patient presents with  . Vaginal Bleeding  . Abdominal Pain   Darlene Ortiz is a 24 y.o. G1P0 at [redacted]w[redacted]d who presents to MAU with complaints of vaginal bleeding and abdominal pain. Patient was recently seen in MAU yesterday for abdominal pain and nausea. Diagnosed with IUP and SCH. Patient yesterday was not having any bleeding. She reports having IC last night then this morning started having vaginal bleeding. Describes the vaginal bleeding as bright red which she is having to wear a pad for it. She denies passing clots or having to change pads. She reports same abdominal cramping that she had when previously seen in MAU.   OB History    Gravida  1   Para      Term      Preterm      AB      Living        SAB      TAB      Ectopic      Multiple      Live Births              Past Medical History:  Diagnosis Date  . Medical history non-contributory     Past Surgical History:  Procedure Laterality Date  . NO PAST SURGERIES      History reviewed. No pertinent family history.  Social History   Tobacco Use  . Smoking status: Never Smoker  . Smokeless tobacco: Never Used  Substance Use Topics  . Alcohol use: Yes    Comment: occassionally  . Drug use: Yes    Types: Marijuana    Comment: May 2021    Allergies: No Known Allergies  Medications Prior to Admission  Medication Sig Dispense Refill Last Dose  . metroNIDAZOLE (METROGEL VAGINAL) 0.75 % vaginal gel Place 1 Applicatorful vaginally 2 (two) times daily. 70 g 0   . promethazine (PHENERGAN) 25 MG tablet Take 1 tablet (25 mg total) by mouth every 6 (six) hours as needed for nausea or vomiting. 30 tablet 0     Review of Systems  Constitutional: Negative.   Respiratory: Negative.   Cardiovascular: Negative.   Gastrointestinal: Positive for abdominal  pain. Negative for constipation, diarrhea, nausea and vomiting.  Genitourinary: Positive for vaginal bleeding. Negative for difficulty urinating, dysuria, frequency, pelvic pain and urgency.  Musculoskeletal: Negative.   Neurological: Negative.   Psychiatric/Behavioral: Negative.    Physical Exam   Blood pressure 130/84, pulse 93, temperature 98.3 F (36.8 C), temperature source Oral, resp. rate 18, weight 64.3 kg, SpO2 100 %.  Physical Exam  Constitutional: She appears well-developed and well-nourished.  HENT:  Head: Normocephalic.  Cardiovascular: Normal rate and regular rhythm.  Respiratory: Effort normal and breath sounds normal. No respiratory distress. She has no wheezes.  GI: Soft. She exhibits no distension. There is no abdominal tenderness. There is no rebound.  Genitourinary:    Vaginal bleeding present.  There is bleeding in the vagina.    Genitourinary Comments: Pelvic exam: Cervix pink, visually closed, without lesion, small amount of dark red vaginal bleeding present with small clot (2 faux swabs), vaginal walls and external genitalia normal   Musculoskeletal:        General: No edema. Normal range of motion.  Neurological: She is alert.  Psychiatric: She has a normal mood and  affect. Her behavior is normal. Thought content normal.   Pt informed that the ultrasound is considered a limited OB ultrasound and is not intended to be a complete ultrasound exam.  Patient also informed that the ultrasound is not being completed with the intent of assessing for fetal or placental anomalies or any pelvic abnormalities.  Explained that the purpose of today's ultrasound is to assess for  viability.  Patient acknowledges the purpose of the exam and the limitations of the study.    FHR 120 by bedside US   MAU Course  Procedures  MDM Speculum examination- small amount of vaginal bleeding, cervix closed  Bedside US - showed live IUP with FHR of 120   Educated and discussed with  patient [facility], what to expect with bleeding and what it means. Discussed with patient reasons to present to MAU for evaluation. Discussed pelvic rest until resolution of [facility], patient verbalizes understanding. Return to MAU as needed. Pt stable at time of discharge.   Assessment and Plan   1. Subchorionic hematoma in first trimester, single or unspecified fetus   2. Vaginal bleeding during pregnancy   3. [redacted] weeks gestation of pregnancy    Discharge home  [facility] precautions and pelvic rest  Encouraged establishment of prenatal care- list of OBGYN providers given to patient  Return to MAU as needed for reasons discussed and/or emergencies   Follow-up Information    [facility] Follow up.   Specialty: Obstetrics and Gynecology Why: Return to MAU as needed for emergencies  Contact information: [address] [id number] [place] [phone]         Allergies as of 04/08/2020   No Known Allergies     Medication List    TAKE these medications   metroNIDAZOLE 0.75 % vaginal gel Commonly known as: METROGEL VAGINAL Place 1 Applicatorful vaginally 2 (two) times daily.   promethazine 25 MG tablet Commonly known as: PHENERGAN Take 1 tablet (25 mg total) by mouth every 6 (six) hours as needed for nausea or vomiting.       Lajean Manes CNM 04/08/2020, 4:57 PM

## 2020-05-04 ENCOUNTER — Other Ambulatory Visit: Payer: Self-pay

## 2020-05-04 ENCOUNTER — Inpatient Hospital Stay (HOSPITAL_COMMUNITY): Payer: Medicaid - Out of State

## 2020-05-04 ENCOUNTER — Encounter (HOSPITAL_COMMUNITY): Payer: Self-pay | Admitting: Family Medicine

## 2020-05-04 ENCOUNTER — Inpatient Hospital Stay (HOSPITAL_COMMUNITY)
Admission: AD | Admit: 2020-05-04 | Discharge: 2020-05-04 | Disposition: A | Discharge status: Home / Self Care | Payer: Medicaid - Out of State | Attending: Family Medicine | Admitting: Family Medicine

## 2020-05-04 DIAGNOSIS — N939 Abnormal uterine and vaginal bleeding, unspecified: Secondary | ICD-10-CM | POA: Diagnosis not present

## 2020-05-04 DIAGNOSIS — M549 Dorsalgia, unspecified: Secondary | ICD-10-CM | POA: Insufficient documentation

## 2020-05-04 DIAGNOSIS — Z9889 Other specified postprocedural states: Secondary | ICD-10-CM | POA: Diagnosis not present

## 2020-05-04 DIAGNOSIS — O046 Delayed or excessive hemorrhage following (induced) termination of pregnancy: Secondary | ICD-10-CM

## 2020-05-04 DIAGNOSIS — R109 Unspecified abdominal pain: Secondary | ICD-10-CM | POA: Insufficient documentation

## 2020-05-04 DIAGNOSIS — R9389 Abnormal findings on diagnostic imaging of other specified body structures: Secondary | ICD-10-CM | POA: Diagnosis not present

## 2020-05-04 LAB — URINALYSIS, COMPLETE (UACMP) WITH MICROSCOPIC
Bilirubin Urine: NEGATIVE
Glucose, UA: NEGATIVE mg/dL
Ketones, ur: NEGATIVE mg/dL
Leukocytes,Ua: NEGATIVE
Nitrite: NEGATIVE
Protein, ur: 100 mg/dL — AB
RBC / HPF: >50 RBC/hpf — ABNORMAL HIGH (ref 0–5)
Specific Gravity, Urine: 1.029 (ref 1.005–1.030)
pH: 5 (ref 5.0–8.0)

## 2020-05-04 LAB — HCG, QUANTITATIVE, PREGNANCY: hCG, Beta Chain, Quant, S: 413 m[IU]/mL — ABNORMAL HIGH (ref <5)

## 2020-05-04 LAB — COMPREHENSIVE METABOLIC PANEL
ALT: 14 U/L (ref 0–44)
AST: 15 U/L (ref 15–41)
Albumin: 3.6 g/dL (ref 3.5–5.0)
Alkaline Phosphatase: 45 U/L (ref 38–126)
Anion gap: 13 (ref 5–15)
BUN: 8 mg/dL (ref 6–20)
CO2: 25 mmol/L (ref 22–32)
Calcium: 9.2 mg/dL (ref 8.9–10.3)
Chloride: 102 mmol/L (ref 98–111)
Creatinine, Ser: 0.65 mg/dL (ref 0.44–1.00)
GFR calc Af Amer: >60 mL/min (ref >60)
GFR calc non Af Amer: >60 mL/min (ref >60)
Glucose, Bld: 87 mg/dL (ref 70–99)
Potassium: 3.9 mmol/L (ref 3.5–5.1)
Sodium: 140 mmol/L (ref 135–145)
Total Bilirubin: 0.3 mg/dL (ref 0.3–1.2)
Total Protein: 6.8 g/dL (ref 6.5–8.1)

## 2020-05-04 LAB — WET PREP, GENITAL
Clue Cells Wet Prep HPF POC: NONE SEEN
Sperm: NONE SEEN
Trich, Wet Prep: NONE SEEN
Yeast Wet Prep HPF POC: NONE SEEN

## 2020-05-04 LAB — CBC
HCT: 31 % — ABNORMAL LOW (ref 36.0–46.0)
Hemoglobin: 9.7 g/dL — ABNORMAL LOW (ref 12.0–15.0)
MCH: 26 pg (ref 26.0–34.0)
MCHC: 31.3 g/dL (ref 30.0–36.0)
MCV: 83.1 fL (ref 80.0–100.0)
Platelets: 344 10*3/uL (ref 150–400)
RBC: 3.73 MIL/uL — ABNORMAL LOW (ref 3.87–5.11)
RDW: 16.3 % — ABNORMAL HIGH (ref 11.5–15.5)
WBC: 4.2 10*3/uL (ref 4.0–10.5)
nRBC: 0 % (ref 0.0–0.2)

## 2020-05-04 NOTE — MAU Provider Note (Addendum)
Patient Darlene Ortiz is a 24 y.o. G1P0 at 2 weeks s/p TAB for bleeding and pain. She reports that she had a surgical abortion on 04/20/2020 in [place]. She denies nausea, vomiting,   She has been sexually active since her TAB; last intercourse was two days ago. She resumed her sexual activity 4 days after her surgical.  History     CSN: [id number]  Arrival date and time: 05/04/20 1821   First Provider Initiated Contact with Patient 05/04/20 1922      Chief Complaint  Patient presents with  . Possible Pregnancy  . Vaginal Bleeding  . Back Pain  . Abdominal Pain   Vaginal Bleeding The patient's primary symptoms include vaginal discharge. This is a new problem. The problem has been unchanged. Associated symptoms include abdominal pain and back pain. Pertinent negatives include no chills, constipation, diarrhea, dysuria or fever. The vaginal discharge was bloody. The vaginal bleeding is typical of menses. She has not been passing clots. She has not been passing tissue.  Back Pain This is a new problem. Episode onset: She fainted in [employer] and she hit her back; she thinks that is why her back is hurting.  The pain is at a severity of 7/10. Associated symptoms include abdominal pain. Pertinent negatives include no dysuria or fever.  Abdominal Pain This is a new problem. The problem occurs intermittently. Pertinent negatives include no constipation, diarrhea, dysuria or fever.    OB History    Gravida  1   Para      Term      Preterm      AB      Living        SAB      TAB      Ectopic      Multiple      Live Births              Past Medical History:  Diagnosis Date  . Medical history non-contributory     Past Surgical History:  Procedure Laterality Date  . NO PAST SURGERIES      No family history on file.  Social History   Tobacco Use  . Smoking status: Never Smoker  . Smokeless tobacco: Never Used  Substance Use Topics  . Alcohol use: Yes   Comment: occassionally  . Drug use: Yes    Types: Marijuana    Comment: May 2021    Allergies: No Known Allergies  Medications Prior to Admission  Medication Sig Dispense Refill Last Dose  . metroNIDAZOLE (METROGEL VAGINAL) 0.75 % vaginal gel Place 1 Applicatorful vaginally 2 (two) times daily. 70 g 0   . promethazine (PHENERGAN) 25 MG tablet Take 1 tablet (25 mg total) by mouth every 6 (six) hours as needed for nausea or vomiting. 30 tablet 0     Review of Systems  Constitutional: Negative for chills and fever.  Gastrointestinal: Positive for abdominal pain. Negative for constipation and diarrhea.  Genitourinary: Positive for vaginal bleeding and vaginal discharge. Negative for dysuria.  Musculoskeletal: Positive for back pain.   Physical Exam   Blood pressure 119/72, pulse 72, temperature 98.7 F (37.1 C), temperature source Oral, resp. rate 16, weight 65 kg, SpO2 100 %, unknown if currently breastfeeding.  Physical Exam  Constitutional: She is oriented to person, place, and time. She appears well-developed.  HENT:  Head: Normocephalic.  GI: Soft.  Genitourinary:    Genitourinary Comments: NEFG; trace amount of blood in the vagina; no CMT, no suprapubic pain,  cervix is FT, long.    Musculoskeletal:     Cervical back: Normal range of motion.  Neurological: She is alert and oriented to person, place, and time.  Skin: Skin is warm.    MAU Course  Procedures Results for orders placed or performed during the hospital encounter of 05/04/20 (from the past 24 hour(s))  Urinalysis, Complete w Microscopic     Status: Abnormal   Collection Time: 05/04/20  7:18 PM  Result Value Ref Range   Color, Urine YELLOW YELLOW   APPearance HAZY (A) CLEAR   Specific Gravity, Urine 1.029 1.005 - 1.030   pH 5.0 5.0 - 8.0   Glucose, UA NEGATIVE NEGATIVE mg/dL   Hgb urine dipstick LARGE (A) NEGATIVE   Bilirubin Urine NEGATIVE NEGATIVE   Ketones, ur NEGATIVE NEGATIVE mg/dL   Protein, ur 100  (A) NEGATIVE mg/dL   Nitrite NEGATIVE NEGATIVE   Leukocytes,Ua NEGATIVE NEGATIVE   RBC / HPF >50 (H) 0 - 5 RBC/hpf   WBC, UA 0-5 0 - 5 WBC/hpf   Bacteria, UA RARE (A) NONE SEEN   Squamous Epithelial / LPF 6-10 0 - 5   Mucus PRESENT   Comprehensive metabolic panel     Status: None   Collection Time: 05/04/20  7:48 PM  Result Value Ref Range   Sodium 140 135 - 145 mmol/L   Potassium 3.9 3.5 - 5.1 mmol/L   Chloride 102 98 - 111 mmol/L   CO2 25 22 - 32 mmol/L   Glucose, Bld 87 70 - 99 mg/dL   BUN 8 6 - 20 mg/dL   Creatinine, Ser 0.65 0.44 - 1.00 mg/dL   Calcium 9.2 8.9 - 10.3 mg/dL   Total Protein 6.8 6.5 - 8.1 g/dL   Albumin 3.6 3.5 - 5.0 g/dL   AST 15 15 - 41 U/L   ALT 14 0 - 44 U/L   Alkaline Phosphatase 45 38 - 126 U/L   Total Bilirubin 0.3 0.3 - 1.2 mg/dL   GFR calc non Af Amer >60 >60 mL/Darlene   GFR calc Af Amer >60 >60 mL/Darlene   Anion gap 13 5 - 15  CBC     Status: Abnormal   Collection Time: 05/04/20  7:48 PM  Result Value Ref Range   WBC 4.2 4.0 - 10.5 K/uL   RBC 3.73 (L) 3.87 - 5.11 MIL/uL   Hemoglobin 9.7 (L) 12.0 - 15.0 g/dL   HCT 31.0 (L) 36.0 - 46.0 %   MCV 83.1 80.0 - 100.0 fL   MCH 26.0 26.0 - 34.0 pg   MCHC 31.3 30.0 - 36.0 g/dL   RDW 16.3 (H) 11.5 - 15.5 %   Platelets 344 150 - 400 K/uL   nRBC 0.0 0.0 - 0.2 %  hCG, quantitative, pregnancy     Status: Abnormal   Collection Time: 05/04/20  7:48 PM  Result Value Ref Range   hCG, Beta Chain, Quant, S 413 (H) <5 mIU/mL  Wet prep, genital     Status: Abnormal   Collection Time: 05/04/20  7:50 PM  Result Value Ref Range   Yeast Wet Prep HPF POC NONE SEEN NONE SEEN   Trich, Wet Prep NONE SEEN NONE SEEN   Clue Cells Wet Prep HPF POC NONE SEEN NONE SEEN   WBC, Wet Prep HPF POC FEW (A) NONE SEEN   Sperm NONE SEEN    US OB Transvaginal  Result Date: 05/04/2020 CLINICAL DATA:  Abortion 2 weeks ago, urine pregnancy test currently positive.  Vaginal bleeding. EXAM: OBSTETRIC <14 WK ULTRASOUND TECHNIQUE:  Transabdominal ultrasound was performed for evaluation of the gestation as well as the maternal uterus and adnexal regions. COMPARISON:  None. FINDINGS: Intrauterine gestational sac: Absent Yolk sac:  N/A Embryo:  N/A Cardiac Activity: N/A Heart Rate: N/A bpm Maternal uterus/adnexae: Maternal right ovary 3.4 by 1.4 by 1.8 cm. Maternal left ovary 1.5 by 3.3 by 1.6 cm. Most of the endometrium is of normal thickness but towards the fundal margin of endometrium is isoechoic to the myometrium and measures about 0.8 cm in thickness. IMPRESSION: Borderline thickening along the fundal margin of the endometrium up to 0.8 cm, but this is less striking than typically seen with retained products of conception and is below the typical threshold 11 mm thickness at which retained products of conception becomes more suspicious. Electronically Signed   By: Gaylyn Rong M.D.   On: 05/04/2020 20:48    MDM -repeat CBC -repeat quant -repeat US to check for retained products of conception -Explained to patient that she may just be having some post-termination bleeding but we will do an [place] to find out.   Patient care endorsed to [facility] at 2020.  Assessment and Plan    Charlesetta Garibaldi [facility] 05/04/2020, 7:22 PM   Reassessment (9:06 PM) -Provider to bedside to discuss results. -Patient reassured that no IUP identified.  Discussed slightly thickened endometrium and how this could contribute to increased bleeding.   -Bleeding Precautions Given -Extensive discussion regarding preventing pregnancy in the future as patient states it is not desired. -Patient reports that she was given script for Sprintec with 99 RF from the clinic! -Patient reports that she has no follow up scheduled for trending of quants or blood pressure check after initiation of pills. -Provider instructed patient to check her blood pressure one month s/p pill initiation and repeat pregnancy test in 2 weeks. -Instructed to call [facility] to schedule  appt for repeat quant if UPT returns positive.   -Precautions given for pill usage including onset of migraines, visual disturbances, chest or leg pain.  -Patient without further questions or concerns. -Discharged to home in stable condition.  Cherre Robins MSN, CNM Advanced Practice Provider, [facility] [employer]

## 2020-05-04 NOTE — MAU Note (Signed)
2 wks ago had an abortion at [facility]. (procedure). That same week she had unprotected sex.  +preg test on Sat, unsure if new preg or from prior, because they said you could still have preg hormones for awhile.  Started bleeding yesterday, got real heavy last night. Having pain in lower abd and low back. Passed out on Friday at [place], hit her back on something she believes.

## 2020-05-04 NOTE — Discharge Instructions (Signed)
Oral Contraception Use Oral contraceptive pills (OCPs) are medicines that you take to prevent pregnancy. OCPs work by:  Preventing the ovaries from releasing eggs.  Thickening mucus in the lower part of the uterus (cervix), which prevents sperm from entering the uterus.  Thinning the lining of the uterus (endometrium), which prevents a fertilized egg from attaching to the endometrium. OCPs are highly effective when taken exactly as prescribed. However, OCPs do not prevent sexually transmitted infections (STIs). Safe sex practices, such as using condoms while on an OCP, can help prevent STIs. Before taking OCPs, you may have a physical exam, blood test, and Pap test. A Pap test involves taking a sample of cells from your cervix to check for cancer. Discuss with your health care provider the possible side effects of the OCP you may be prescribed. When you start an OCP, be aware that it can take 2-3 months for your body to adjust to changes in hormone levels. How to take oral contraceptive pills Follow instructions from your health care provider about how to start taking your first cycle of OCPs. Your health care provider may recommend that you:  Start the pill on day 1 of your menstrual period. If you start at this time, you will not need any backup form of birth control (contraception), such as condoms.  Start the pill on the first Sunday after your menstrual period or on the day you get your prescription. In these cases, you will need to use backup contraception for the first week.  Start the pill at any time of your cycle. ? If you take the pill within 5 days of the start of your period, you will not need a backup form of contraception. ? If you start at any other time of your menstrual cycle, you will need to use another form of contraception for 7 days. If your OCP is the type called a minipill, it will protect you from pregnancy after taking it for 2 days (48 hours), and you can stop using  backup contraception after that time. After you have started taking OCPs:  If you forget to take 1 pill, take it as soon as you remember. Take the next pill at the regular time.  If you miss 2 or more pills, call your health care provider. Different pills have different instructions for missed doses. Use backup birth control until your next menstrual period starts.  If you use a 28-day pack that contains inactive pills and you miss 1 of the last 7 pills (pills with no hormones), throw away the rest of the non-hormone pills and start a new pill pack. No matter which day you start the OCP, you will always start a new pack on that same day of the week. Have an extra pack of OCPs and a backup contraceptive method available in case you miss some pills or lose your OCP pack. Follow these instructions at home:  Do not use any products that contain nicotine or tobacco, such as cigarettes and e-cigarettes. If you need help quitting, ask your health care provider.  Always use a condom to protect against STIs. OCPs do not protect against STIs.  Use a calendar to mark the days of your menstrual period.  Read the information and directions that came with your OCP. Talk to your health care provider if you have questions. Contact a health care provider if:  You develop nausea and vomiting.  You have abnormal vaginal discharge or bleeding.  You develop a rash.    You miss your menstrual period. Depending on the type of OCP you are taking, this may be a sign of pregnancy. Ask your health care provider for more information.  You are losing your hair.  You need treatment for mood swings or depression.  You get dizzy when taking the OCP.  You develop acne after taking the OCP.  You become pregnant or think you may be pregnant.  You have diarrhea, constipation, and abdominal pain or cramps.  You miss 2 or more pills. Get help right away if:  You develop chest pain.  You develop shortness of  breath.  You have an uncontrolled or severe headache.  You develop numbness or slurred speech.  You develop visual or speech problems.  You develop pain, redness, and swelling in your legs.  You develop weakness or numbness in your arms or legs. Summary  Oral contraceptive pills (OCPs) are medicines that you take to prevent pregnancy.  OCPs do not prevent sexually transmitted infections (STIs). Always use a condom to protect against STIs.  When you start an OCP, be aware that it can take 2-3 months for your body to adjust to changes in hormone levels.  Read all the information and directions that come with your OCP. This information is not intended to replace advice given to you by your health care provider. Make sure you discuss any questions you have with your health care provider. Document Revised: 03/14/2019 Document Reviewed: 01/01/2017 Elsevier Patient Education  2020 [employer]  

## 2020-05-05 LAB — GC/CHLAMYDIA PROBE AMP (~~LOC~~) NOT AT ARMC
Chlamydia: NEGATIVE
Comment: NEGATIVE
Comment: NORMAL
Neisseria Gonorrhea: NEGATIVE

## 2020-05-05 LAB — POCT PREGNANCY, URINE: Preg Test, Ur: POSITIVE — AB

## 2020-07-09 ENCOUNTER — Encounter (HOSPITAL_COMMUNITY): Payer: Self-pay | Admitting: Emergency Medicine

## 2020-07-09 ENCOUNTER — Emergency Department (HOSPITAL_COMMUNITY)
Admission: EM | Admit: 2020-07-09 | Discharge: 2020-07-09 | Disposition: A | Discharge status: Home / Self Care | Payer: Medicaid - Out of State | Attending: Emergency Medicine | Admitting: Emergency Medicine

## 2020-07-09 ENCOUNTER — Emergency Department (HOSPITAL_COMMUNITY): Payer: Medicaid - Out of State

## 2020-07-09 ENCOUNTER — Other Ambulatory Visit: Payer: Self-pay

## 2020-07-09 DIAGNOSIS — M791 Myalgia, unspecified site: Secondary | ICD-10-CM | POA: Insufficient documentation

## 2020-07-09 DIAGNOSIS — R0789 Other chest pain: Secondary | ICD-10-CM | POA: Insufficient documentation

## 2020-07-09 DIAGNOSIS — M542 Cervicalgia: Secondary | ICD-10-CM | POA: Diagnosis not present

## 2020-07-09 DIAGNOSIS — R519 Headache, unspecified: Secondary | ICD-10-CM | POA: Insufficient documentation

## 2020-07-09 DIAGNOSIS — Z20822 Contact with and (suspected) exposure to covid-19: Secondary | ICD-10-CM | POA: Diagnosis not present

## 2020-07-09 LAB — CBC
HCT: 32.1 % — ABNORMAL LOW (ref 36.0–46.0)
Hemoglobin: 9.6 g/dL — ABNORMAL LOW (ref 12.0–15.0)
MCH: 24.9 pg — ABNORMAL LOW (ref 26.0–34.0)
MCHC: 29.9 g/dL — ABNORMAL LOW (ref 30.0–36.0)
MCV: 83.4 fL (ref 80.0–100.0)
Platelets: 223 10*3/uL (ref 150–400)
RBC: 3.85 MIL/uL — ABNORMAL LOW (ref 3.87–5.11)
RDW: 14.6 % (ref 11.5–15.5)
WBC: 2.1 10*3/uL — ABNORMAL LOW (ref 4.0–10.5)
nRBC: 0 % (ref 0.0–0.2)

## 2020-07-09 LAB — BASIC METABOLIC PANEL
Anion gap: 8 (ref 5–15)
BUN: <5 mg/dL — ABNORMAL LOW (ref 6–20)
CO2: 25 mmol/L (ref 22–32)
Calcium: 9.2 mg/dL (ref 8.9–10.3)
Chloride: 104 mmol/L (ref 98–111)
Creatinine, Ser: 0.76 mg/dL (ref 0.44–1.00)
GFR calc Af Amer: >60 mL/min (ref >60)
GFR calc non Af Amer: >60 mL/min (ref >60)
Glucose, Bld: 106 mg/dL — ABNORMAL HIGH (ref 70–99)
Potassium: 3.6 mmol/L (ref 3.5–5.1)
Sodium: 137 mmol/L (ref 135–145)

## 2020-07-09 LAB — I-STAT BETA HCG BLOOD, ED (MC, WL, AP ONLY): I-stat hCG, quantitative: <5 m[IU]/mL (ref <5)

## 2020-07-09 LAB — SARS CORONAVIRUS 2 BY RT PCR (HOSPITAL ORDER, PERFORMED IN ~~LOC~~ HOSPITAL LAB): SARS Coronavirus 2: NEGATIVE

## 2020-07-09 LAB — TROPONIN I (HIGH SENSITIVITY)
Troponin I (High Sensitivity): 19 ng/L — ABNORMAL HIGH (ref <18)
Troponin I (High Sensitivity): 16 ng/L (ref <18)

## 2020-07-09 LAB — D-DIMER, QUANTITATIVE: D-Dimer, Quant: 0.47 ug/mL-FEU (ref 0.00–0.50)

## 2020-07-09 MED ORDER — KETOROLAC TROMETHAMINE 30 MG/ML IJ SOLN
30.0000 mg | Freq: Once | INTRAMUSCULAR | Status: AC
  Administered 2020-07-09: 30 mg via INTRAMUSCULAR
  Filled 2020-07-09: qty 1

## 2020-07-09 MED ORDER — NAPROXEN 500 MG PO TABS: 500.0000 mg | ORAL_TABLET | Freq: Three times a day (TID) | ORAL | 0 refills | Status: AC

## 2020-07-09 MED ORDER — METOCLOPRAMIDE HCL 10 MG PO TABS
10.0000 mg | ORAL_TABLET | Freq: Once | ORAL | Status: AC
  Administered 2020-07-09: 10 mg via ORAL
  Filled 2020-07-09: qty 1

## 2020-07-09 MED ORDER — METHOCARBAMOL 500 MG PO TABS
1000.0000 mg | ORAL_TABLET | Freq: Once | ORAL | Status: AC
  Administered 2020-07-09: 1000 mg via ORAL
  Filled 2020-07-09: qty 2

## 2020-07-09 MED ORDER — SODIUM CHLORIDE 0.9% FLUSH: 3.0000 mL | Freq: Once | INTRAVENOUS | Status: DC

## 2020-07-09 MED ORDER — METHOCARBAMOL 500 MG PO TABS: 1000.0000 mg | ORAL_TABLET | Freq: Three times a day (TID) | ORAL | 0 refills | Status: AC

## 2020-07-09 MED ORDER — ACETAMINOPHEN 500 MG PO TABS
1000.0000 mg | ORAL_TABLET | Freq: Once | ORAL | Status: AC
  Administered 2020-07-09: 1000 mg via ORAL
  Filled 2020-07-09: qty 2

## 2020-07-09 NOTE — ED Triage Notes (Signed)
Pt states she has had L sided chest, neck and shoulder pain x3 days, denies cough, body aches or fever, states a coworker recently tested positive for covid.

## 2020-07-09 NOTE — Discharge Instructions (Addendum)
You were see in the ER for neck and head pain, chest pressure  Cardiac work up today essentially ruled out cardiac or pulmonary acute issues  I suspect your pain is from muscular tension leading to headaches  Take naproxen every 8 hours.  Use methocarbamol [phone] mg for muscle spasms, tightness. Recommend massage and heat to help loosen up neck muscles  Return to ER for worsening sudden or severe neck pain and headache, vision changes, neck stiffness, stroke symptoms like arm weakness or numbness, chest pain or shortness of breath with exertion or activity  COVID test is still pending

## 2020-07-09 NOTE — ED Provider Notes (Signed)
[facility] EMERGENCY DEPARTMENT Provider Note   CSN: [id number] Arrival date & time: 07/09/20  2725     History Chief Complaint  Patient presents with  . Chest Pain    Darlene Ortiz is a 24 y.o. female presents to the ED for evaluation of neck pain that began 3 days ago. Described as sharp and pulling and pressure. It is constant but much worse when she tries to stand up, move her neck which makes the pain worse. When the pain gets really bad she states it shoots up into her head and makes her head "pulsing". It is particularly worse when she tries to stand up. This morning she was having a bowel movement and as she was straining she felt like the pain was worse. States also feeling like there is pressure on top of her head, neck and chest. She feels like maybe her breasts are too big and they are causing her chest to feel pressured. Because of the chest pain she is worried about Covid. Also states that one person at work tested positive for it. 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 Yesterday she felt like sitting down and driving looking down at the road made the pain in her neck worse which prompted ED visit today. Took ibuprofen without improvement. Reports occasional headaches but never this severe. Denies any neck or head trauma or falls. No anticoagulants. Denies any visual changes, ringing in her ears, facial or extremity weakness or numbness. No shortness of breath. No history of blood clots. Had a elective surgical abortion at 8 weeks and 3 days in May and took oral birth control pills for only 2 weeks and stopped in June. No recent prolonged travel, lower extremity or calf pain or swelling. No fever.  HPI     Past Medical History:  Diagnosis Date  . Medical history non-contributory     There are no problems to display for this patient.   Past Surgical History:  Procedure Laterality Date  . NO  PAST SURGERIES       OB History    Gravida  1   Para      Term      Preterm      AB  1   Living        SAB      TAB  1   Ectopic      Multiple      Live Births              No family history on file.  Social History   Tobacco Use  . Smoking status: Never Smoker  . Smokeless tobacco: Never Used  Vaping Use  . Vaping Use: Never used  Substance Use Topics  . Alcohol use: Yes    Comment: occassionally  . Drug use: Yes    Types: Marijuana    Comment: May 2021    Home Medications Prior to Admission medications   Medication Sig Start Date End Date Taking? Authorizing Provider  ibuprofen (ADVIL) 200 MG tablet Take 600 mg by mouth as needed for moderate pain.   Yes [provider]  methocarbamol (ROBAXIN) 500 MG tablet Take 2 tablets (1,000 mg total) by mouth 3 (three) times daily for 7 days. 07/09/20 07/16/20  Liberty Handy, PA-C  metroNIDAZOLE (METROGEL VAGINAL) 0.75 % vaginal gel Place 1 Applicatorful vaginally 2 (two) times daily. Patient not taking: Reported on  07/09/2020 04/07/20   [name], NP  naproxen (NAPROSYN) 500 MG tablet Take 1 tablet (500 mg total) by mouth 3 (three) times daily with meals for 7 days. 07/09/20 07/16/20  Liberty Handy, PA-C  promethazine (PHENERGAN) 25 MG tablet Take 1 tablet (25 mg total) by mouth every 6 (six) hours as needed for nausea or vomiting. Patient not taking: Reported on 07/09/2020 04/07/20   [name], NP    Allergies    Patient has no known allergies.  Review of Systems   Review of Systems  Cardiovascular: Positive for chest pain (pressure).  Musculoskeletal: Positive for myalgias and neck pain.  Neurological: Positive for headaches.  All other systems reviewed and are negative.   Physical Exam Updated Vital Signs BP 136/77   Pulse 72   Temp 97.9 F (36.6 C) (Oral)   Resp 16   Ht 5\' 4"  (1.626 m)   Wt 63.5 kg   LMP  (LMP Unknown)   SpO2 100%   BMI 24.03 kg/m   Physical Exam Vitals  and nursing note reviewed.  Constitutional:      General: She is not in acute distress.    Appearance: She is well-developed.     Comments: NAD.  HENT:     Head: Normocephalic and atraumatic.     Right Ear: External ear normal.     Left Ear: External ear normal.     Nose: Nose normal.  Eyes:     General: No scleral icterus.    Conjunctiva/sclera: Conjunctivae normal.  Neck:     Comments: Bilateral upper trapezius and paraspinal cervical tenderness. Maximal point of tenderness is at the base of the occiput bilaterally, right greater than left. Full range of motion of the neck, patient reports more pain with neck bend and rotation. No rigidity or meningismus. No midline tenderness. No carotid bruits. Trachea midline. Cardiovascular:     Rate and Rhythm: Normal rate and regular rhythm.     Heart sounds: Normal heart sounds. No murmur heard.      Comments: No lower extremity edema or calf pain. 1+ radial and DP pulses bilaterally. Pulmonary:     Effort: Pulmonary effort is normal.     Breath sounds: Normal breath sounds. No wheezing.  Musculoskeletal:        General: No deformity. Normal range of motion.     Cervical back: Normal range of motion and neck supple.  Skin:    General: Skin is warm and dry.     Capillary Refill: Capillary refill takes less than 2 seconds.  Neurological:     Mental Status: She is alert and oriented to person, place, and time.     Comments:  Alert and oriented to self, place, time and event.  Speech is fluent without dysarthria or dysphasia. Strength 5/5 with hand grip and ankle F/E.   Sensation to light touch intact in face, hands and feet. Ambulatory in room with normal gait.  No pronator drift. No leg drop. Normal finger-to-nose.  CN I not tested.  CN II -XII grossly intact.   Psychiatric:        Behavior: Behavior normal.        Thought Content: Thought content normal.        Judgment: Judgment normal.     ED Results / Procedures / Treatments    Labs (all labs ordered are listed, but only abnormal results are displayed) Labs Reviewed  BASIC METABOLIC PANEL - Abnormal; Notable for the following components:  Result Value   Glucose, Bld 106 (*)    BUN <5 (*)    All other components within normal limits  CBC - Abnormal; Notable for the following components:   WBC 2.1 (*)    RBC 3.85 (*)    Hemoglobin 9.6 (*)    HCT 32.1 (*)    MCH 24.9 (*)    MCHC 29.9 (*)    All other components within normal limits  TROPONIN I (HIGH SENSITIVITY) - Abnormal; Notable for the following components:   Troponin I (High Sensitivity) 19 (*)    All other components within normal limits  SARS CORONAVIRUS 2 BY RT PCR (HOSPITAL ORDER, PERFORMED IN [facility])  D-DIMER, QUANTITATIVE (NOT [facility])  I-STAT BETA HCG BLOOD, ED ([facility], [facility], [facility] ONLY)  TROPONIN I (HIGH SENSITIVITY)    EKG EKG Interpretation  Date/Time:  Friday July 09 2020 09:10:27 EDT Ventricular Rate:  87 PR Interval:  194 QRS Duration: 92 QT Interval:  346 QTC Calculation: 416 R Axis:   107 Text Interpretation: Unusual P axis, possible ectopic atrial rhythm Rightward axis Incomplete right bundle branch block Cannot rule out Anterior infarct , age undetermined Abnormal ECG Confirmed by Cherlynn PerchesKatz, Eric ([id number]) on 07/09/2020 1:00:09 PM   Radiology DG Chest 2 View  Result Date: 07/09/2020 CLINICAL DATA:  Pain EXAM: CHEST - 2 VIEW COMPARISON:  None. FINDINGS: The heart size and mediastinal contours are within normal limits. Both lungs are clear. No pneumothorax or pleural effusion. No acute osseous abnormalities. IMPRESSION: No acute airspace disease. Electronically Signed   By: Stana Buntinghikanele  Emekauwa M.D.   On: 07/09/2020 09:37    Procedures Procedures (including critical care time)  Medications Ordered in ED Medications  sodium chloride flush (NS) 0.9 % injection 3 mL (3 mLs Intravenous Not Given 07/09/20 1348)  ketorolac (TORADOL) 30 MG/ML injection 30 mg (30 mg  Intramuscular Given 07/09/20 1347)  acetaminophen (TYLENOL) tablet 1,000 mg (1,000 mg Oral Given 07/09/20 1345)  metoCLOPramide (REGLAN) tablet 10 mg (10 mg Oral Given 07/09/20 1346)  methocarbamol (ROBAXIN) tablet 1,000 mg (1,000 mg Oral Given 07/09/20 1345)    ED Course  I have reviewed the triage vital signs and the nursing notes.  Pertinent labs & imaging results that were available during my care of the patient were reviewed by me and considered in my medical decision making (see chart for details).  Clinical Course as of Jul 09 1508  Fri Jul 09, 2020  1239 Unusual P axis, possible ectopic atrial rhythm Rightward axis Incomplete right bundle branch block Cannot rule out Anterior infarct , age undetermined Abnormal ECG  ED EKG [CG]  1239 Troponin I (High Sensitivity)(!): 19 [CG]  1426 Re-evaluated patient. Has had complete resolution of headache, neck pain. "feels great".  Pending d-dimer    [CG]    Clinical Course User Index [CG] Liberty HandyGibbons, Demetra Moya J, PA-C   MDM Rules/Calculators/A&P                          24 year old female presents for essentially neck pain that radiates to her head and chest, chest pressure.    No associated red flags like thunderclap headache, fever, neck stiffness, neuro symptoms. She has no other cardiac or respiratory symptoms either. No recent illnesses. Some concern about Covid because of her recent colleague testing positive but otherwise no Covid symptoms. Exam is benign, normal neuro exam, no meningismus.  On exam she has reproducible neck and trapezius tenderness  also worse with movement.   ER work up initiated in triage.  I have added second trop, d-dimer given she has been on OCPs.    ER work up personally reviewed. Hemoglobin stable. Trop 19 > 16. HEART score < 3 and overall very low suspicion for ACS.  EKG with inverted p-waves, reviewed with EDP.  CXR non acute.  D-dimer negative.  Pending COVID.    Ordered toradol, tylenol, robaxin and patient  re-evaluated, reports complete resolution on pain.    Given symptomatology, exam, non ischemic cardiac work up in ER and HEART score patient is appropriate for discharge with PCP follow up.  Work up not suggestive of symptomatic anemia, PE, PTX, dissection, ACS.  Given neck pain and headache considered dissection but unlikely given benign exam, no meningismus and no neuro deficits and complete resolution of symptoms after teratment.  Will dc with robaxin, NSAID. Return precautions given. Patient in agreement with POC.   Final Clinical Impression(s) / ED Diagnoses Final diagnoses:  Neck pain  Chest pressure    Rx / DC Orders ED Discharge Orders         Ordered    methocarbamol (ROBAXIN) 500 MG tablet  3 times daily     Discontinue  Reprint     07/09/20 1443    naproxen (NAPROSYN) 500 MG tablet  3 times daily with meals     Discontinue  Reprint     07/09/20 1443           Liberty Handy, PA-C 07/09/20 1509    Sabino Donovan, MD 07/10/20 2004

## 2020-07-09 NOTE — ED Notes (Signed)
Patient up walking to bathroom, gait steady 

## 2020-08-10 ENCOUNTER — Ambulatory Visit: Payer: MEDICAID | Attending: Internal Medicine

## 2020-08-10 DIAGNOSIS — Z23 Encounter for immunization: Secondary | ICD-10-CM

## 2020-08-10 NOTE — Progress Notes (Signed)
   Covid-19 Vaccination Clinic  Name:  Darlene Ortiz    MRN: [id number] DOB: 08/06/1996  08/10/2020  [name] was observed post Covid-19 immunization for 15 minutes without incident. She was provided with Vaccine Information Sheet and instruction to access the V-Safe system.   [name] was instructed to call 911 with any severe reactions post vaccine: Marland Kitchen Difficulty breathing  . Swelling of face and throat  . A fast heartbeat  . A bad rash all over body  . Dizziness and weakness   Immunizations Administered    Name Date Dose VIS Date Route   Pfizer COVID-19 Vaccine 08/10/2020  3:31 PM 0.3 mL 01/28/2019 Intramuscular   Manufacturer: [employer], [employer]   Lot: [id number]   NDC: [id number]

## 2020-08-31 ENCOUNTER — Ambulatory Visit: Payer: MEDICAID | Attending: Internal Medicine

## 2020-08-31 DIAGNOSIS — Z23 Encounter for immunization: Secondary | ICD-10-CM

## 2020-08-31 NOTE — Progress Notes (Signed)
   Covid-19 Vaccination Clinic  Name:  Darlene Ortiz    MRN: [id number] DOB: [birth year]  08/31/2020  Darlene Ortiz was observed post Covid-19 immunization for 15 minutes without incident. She was provided with Vaccine Information Sheet and instruction to access the V-Safe system.   Darlene Ortiz was instructed to call 911 with any severe reactions post vaccine: Marland Kitchen Difficulty breathing  . Swelling of face and throat  . A fast heartbeat  . A bad rash all over body  . Dizziness and weakness   Immunizations Administered    Name Date Dose VIS Date Route   Pfizer COVID-19 Vaccine 08/31/2020  3:38 PM 0.3 mL 01/28/2019 Intramuscular   Manufacturer: [employer], [employer]   Lot: [id number]   NDC: [address]-4

## 2020-10-22 ENCOUNTER — Other Ambulatory Visit: Payer: Self-pay

## 2020-10-22 ENCOUNTER — Encounter (HOSPITAL_COMMUNITY): Payer: Self-pay

## 2020-10-22 ENCOUNTER — Ambulatory Visit (HOSPITAL_COMMUNITY)
Admission: EM | Admit: 2020-10-22 | Discharge: 2020-10-22 | Disposition: A | Discharge status: Home / Self Care | Payer: Medicaid - Out of State

## 2020-10-22 DIAGNOSIS — H6123 Impacted cerumen, bilateral: Secondary | ICD-10-CM

## 2020-10-22 NOTE — Discharge Instructions (Signed)
Use Debrox ear wax removal kit

## 2020-10-22 NOTE — ED Triage Notes (Signed)
Pt in with c/o right ear pain that has been going on for about 1 week.  States that she was seen before for a buildup of wax.  States that it feels like she is losing her hearing.

## 2020-10-22 NOTE — ED Provider Notes (Signed)
Darlene Ortiz    CSN: [id number] Arrival date & time: 10/22/20  1756      History   Chief Complaint Chief Complaint  Patient presents with   Otalgia    HPI Darlene Ortiz is a 24 y.o. female.   The history is provided by the patient. No language interpreter was used.  Otalgia Location:  Bilateral Quality:  Aching Severity:  Mild Onset quality:  Gradual Timing:  Constant Progression:  Worsening Chronicity:  New Relieved by:  Nothing Worsened by:  Nothing Ineffective treatments:  None tried Pt reports her ears are full of wax.  Pt reports no relief trying to wash out ears at home   Past Medical History:  Diagnosis Date   Medical history non-contributory     There are no problems to display for this patient.   Past Surgical History:  Procedure Laterality Date   NO PAST SURGERIES      OB History    Gravida  1   Para      Term      Preterm      AB  1   Living        SAB      TAB  1   Ectopic      Multiple      Live Births               Home Medications    Prior to Admission medications   Medication Sig Start Date End Date Taking? Authorizing Provider  ibuprofen (ADVIL) 200 MG tablet Take 600 mg by mouth as needed for moderate pain.    [provider]  metroNIDAZOLE (METROGEL VAGINAL) 0.75 % vaginal gel Place 1 Applicatorful vaginally 2 (two) times daily. Patient not taking: Reported on 07/09/2020 04/07/20   Jorje Guild, NP  promethazine (PHENERGAN) 25 MG tablet Take 1 tablet (25 mg total) by mouth every 6 (six) hours as needed for nausea or vomiting. Patient not taking: Reported on 07/09/2020 04/07/20   Jorje Guild, NP    Family History History reviewed. No pertinent family history.  Social History Social History   Tobacco Use   Smoking status: Never Smoker   Smokeless tobacco: Never Used  Vaping Use   Vaping Use: Never used  Substance Use Topics   Alcohol use: Yes    Comment: occassionally   Drug  use: Yes    Types: Marijuana    Comment: May 2021     Allergies   Patient has no known allergies.   Review of Systems Review of Systems  HENT: Positive for ear pain.   All other systems reviewed and are negative.    Physical Exam Triage Vital Signs ED Triage Vitals  Enc Vitals Group     BP 10/22/20 1842 117/76     Pulse Rate 10/22/20 1842 81     Resp 10/22/20 1842 19     Temp 10/22/20 1842 98.2 F (36.8 C)     Temp Source 10/22/20 1842 Oral     SpO2 10/22/20 1842 98 %     Weight --      Height --      Head Circumference --      Peak Flow --      Pain Score 10/22/20 1840 6     Pain Loc --      Pain Edu? --      Excl. in [place]? --    No data found.  Updated Vital Signs BP 117/76 (BP Location:  Right Arm)    Pulse 81    Temp 98.2 F (36.8 C) (Oral)    Resp 19    LMP 10/17/2020 (Approximate)    SpO2 98%    Breastfeeding No   Visual Acuity Right Eye Distance:   Left Eye Distance:   Bilateral Distance:    Right Eye Near:   Left Eye Near:    Bilateral Near:     Physical Exam Vitals and nursing note reviewed.  Constitutional:      Appearance: She is well-developed.  HENT:     Head: Normocephalic.     Right Ear: There is impacted cerumen.     Left Ear: There is impacted cerumen.  Cardiovascular:     Rate and Rhythm: Normal rate.  Pulmonary:     Effort: Pulmonary effort is normal.  Musculoskeletal:        General: Normal range of motion.     Cervical back: Normal range of motion.  Skin:    General: Skin is warm.  Neurological:     General: No focal deficit present.     Mental Status: She is alert and oriented to person, place, and time.  Psychiatric:        Mood and Affect: Mood normal.      UC Treatments / Results  Labs (all labs ordered are listed, but only abnormal results are displayed) Labs Reviewed - No data to display  EKG   Radiology No results found.  Procedures Procedures (including critical care time)  Medications Ordered in  UC Medications - No data to display  Initial Impression / Assessment and Plan / UC Course  I have reviewed the triage vital signs and the nursing notes.  Pertinent labs & imaging results that were available during my care of the patient were reviewed by me and considered in my medical decision making (see chart for details).     Ear wax irrigatted out by RN  Pt rexamined,  Canals clear.  Pt counseled on wax removal  Final Clinical Impressions(s) / UC Diagnoses   Final diagnoses:  Bilateral impacted cerumen     Discharge Instructions     Use Debrox ear wax removal kit    ED Prescriptions    None     PDMP not reviewed this encounter.  An After Visit Summary was printed and given to the patient.    Fransico Meadow, [place] 10/22/20 1943

## 2021-03-22 ENCOUNTER — Encounter (HOSPITAL_COMMUNITY): Payer: Self-pay | Admitting: Emergency Medicine

## 2021-03-22 ENCOUNTER — Other Ambulatory Visit: Payer: Self-pay

## 2021-03-22 ENCOUNTER — Emergency Department (HOSPITAL_COMMUNITY)
Admission: EM | Admit: 2021-03-22 | Discharge: 2021-03-22 | Disposition: A | Discharge status: Home / Self Care | Payer: Self-pay | Attending: Emergency Medicine | Admitting: Emergency Medicine

## 2021-03-22 DIAGNOSIS — J3489 Other specified disorders of nose and nasal sinuses: Secondary | ICD-10-CM | POA: Insufficient documentation

## 2021-03-22 DIAGNOSIS — R0981 Nasal congestion: Secondary | ICD-10-CM | POA: Insufficient documentation

## 2021-03-22 DIAGNOSIS — Y92410 Unspecified street and highway as the place of occurrence of the external cause: Secondary | ICD-10-CM | POA: Insufficient documentation

## 2021-03-22 DIAGNOSIS — Z20822 Contact with and (suspected) exposure to covid-19: Secondary | ICD-10-CM | POA: Insufficient documentation

## 2021-03-22 DIAGNOSIS — M542 Cervicalgia: Secondary | ICD-10-CM | POA: Insufficient documentation

## 2021-03-22 DIAGNOSIS — R0989 Other specified symptoms and signs involving the circulatory and respiratory systems: Secondary | ICD-10-CM | POA: Insufficient documentation

## 2021-03-22 LAB — SARS CORONAVIRUS 2 (TAT 6-24 HRS): SARS Coronavirus 2: NEGATIVE

## 2021-03-22 MED ORDER — LIDOCAINE 5 % EX PTCH
1.0000 | MEDICATED_PATCH | CUTANEOUS | Status: DC
  Administered 2021-03-22: 1 via TRANSDERMAL
  Filled 2021-03-22: qty 1

## 2021-03-22 MED ORDER — METHOCARBAMOL 500 MG PO TABS
500.0000 mg | ORAL_TABLET | Freq: Two times a day (BID) | ORAL | 0 refills | Status: DC | PRN
Start: 2021-03-22 — End: 2022-08-03
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
## 2021-09-12 ENCOUNTER — Telehealth: Payer: Self-pay

## 2021-09-12 NOTE — Telephone Encounter (Signed)
Telephoned patient at home number. Left a voice mail with [facility] contact information.

## 2021-09-27 ENCOUNTER — Other Ambulatory Visit: Payer: Self-pay

## 2021-09-27 ENCOUNTER — Ambulatory Visit: Payer: Self-pay | Admitting: *Deleted

## 2021-09-27 VITALS — BP 116/78 | Wt 146.0 lb

## 2021-09-27 DIAGNOSIS — R87610 Atypical squamous cells of undetermined significance on cytologic smear of cervix (ASC-US): Secondary | ICD-10-CM

## 2021-09-27 DIAGNOSIS — Z1239 Encounter for other screening for malignant neoplasm of breast: Secondary | ICD-10-CM

## 2021-09-27 NOTE — Progress Notes (Signed)
[name] is a 25 y.o. female who presents to [facility] clinic today with no complaints. Patient referred to [facility] by the [facility] Department due to having an abnormal Pap smear 08/05/2021 that a colposcopy is recommended for follow up.   Pap Smear: Pap smear not completed today. Last Pap smear was 08/05/2021 at the [facility] Department clinic and was abnormal - ASCUS with positive HPV . Per patient has no history of an abnormal Pap smear prior to her most recent Pap smear. Last Pap smear result is not available in Epic. Last Pap smear result will be scanned into Epic.   Physical exam: Breasts Breasts symmetrical. No skin abnormalities bilateral breasts. No nipple retraction bilateral breasts. No nipple discharge bilateral breasts. No lymphadenopathy. No lumps palpated bilateral breasts. No complaints of pain or tenderness on exam. Screening mammogram is recommended at age 56 unless clinically indicated prior.        Pelvic/Bimanual Pap is not indicated today per [facility] guidelines.   Smoking History: Patient has never smoked.   Patient Navigation: Patient education provided. Access to services provided for patient through [facility] program.    Breast and Cervical Cancer Risk Assessment: Patient has family history of her maternal grandmother having breast cancer. Patient has no known genetic mutations or history of radiation treatment to the chest before age 30. Patient does not have history of cervical dysplasia, immunocompromised, or DES exposure in-utero. Breast cancer risk assessment completed. No breast cancer risk calculated due to patient is less than 28 years old.  Risk Assessment     Risk Scores       09/27/2021   Last edited by: Meryl Dare, CMA   5-year risk:    Lifetime risk:             A: [facility] exam without pap smear No complaints.  P: Referred patient to the [facility] for a colposcopy to follow up for  her abnormal Pap smear. Appointment scheduled Thursday, October 13, 2021 at 0815.  Priscille Heidelberg, RN 09/27/2021 3:03 PM

## 2021-09-27 NOTE — Patient Instructions (Signed)
Explained breast self awareness with [name]. Patient did not need a Pap smear today due to last Pap smear was 08/05/2021. Explained the colposcopy the recommended follow-up for her abnormal Pap smear. Referred patient to the [facility] for a colposcopy to follow up for her abnormal Pap smear. Appointment scheduled Thursday, October 13, 2021 at 0815. Patient aware of appointment and will be there. Let patient know a screening mammogram is recommended at age 16 unless clinically indicated prior. [name] verbalized understanding.  Erico Stan, Kathaleen Maser, RN 3:03 PM

## 2021-10-13 ENCOUNTER — Other Ambulatory Visit (HOSPITAL_COMMUNITY)
Admission: RE | Admit: 2021-10-13 | Discharge: 2021-10-13 | Disposition: A | Discharge status: Home / Self Care | Payer: Medicaid Other | Source: Ambulatory Visit | Attending: Obstetrics & Gynecology | Admitting: Obstetrics & Gynecology

## 2021-10-13 ENCOUNTER — Other Ambulatory Visit: Payer: Self-pay

## 2021-10-13 ENCOUNTER — Ambulatory Visit (INDEPENDENT_AMBULATORY_CARE_PROVIDER_SITE_OTHER): Payer: Self-pay | Admitting: Obstetrics & Gynecology

## 2021-10-13 VITALS — BP 126/82 | HR 86 | Wt 139.7 lb

## 2021-10-13 DIAGNOSIS — R87610 Atypical squamous cells of undetermined significance on cytologic smear of cervix (ASC-US): Secondary | ICD-10-CM | POA: Diagnosis present

## 2021-10-13 DIAGNOSIS — R87810 Cervical high risk human papillomavirus (HPV) DNA test positive: Secondary | ICD-10-CM | POA: Insufficient documentation

## 2021-10-13 LAB — POCT PREGNANCY, URINE: Preg Test, Ur: NEGATIVE

## 2021-10-13 NOTE — Progress Notes (Signed)
Patient ID: Darlene Ortiz, female   DOB: June 09, 1996, 25 y.o.   MRN: [id number]  Chief Complaint  Patient presents with   Colposcopy    HPI Darlene Ortiz is a 25 y.o. female.  [id number] Patient's last menstrual period was 10/04/2021 (exact date).  HPI  Indications: Pap smear on September 2022 showed: ASCUS with POSITIVE high risk HPV. Previous colposcopy: no. Prior cervical treatment: no treatment.  Past Medical History:  Diagnosis Date   Medical history non-contributory     Past Surgical History:  Procedure Laterality Date   NO PAST SURGERIES      Family History  Problem Relation Age of Onset   Diabetes Mother    Breast cancer Maternal Grandmother     Social History Social History   Tobacco Use   Smoking status: Never   Smokeless tobacco: Never  Vaping Use   Vaping Use: Never used  Substance Use Topics   Alcohol use: Yes    Comment: occassionally   Drug use: Not Currently    Types: Marijuana    Comment: May 2021    No Known Allergies  Current Outpatient Medications  Medication Sig Dispense Refill   ibuprofen (ADVIL) 200 MG tablet Take 200-400 mg by mouth as needed for moderate pain.     methocarbamol (ROBAXIN) 500 MG tablet Take 1 tablet (500 mg total) by mouth 2 (two) times daily as needed for muscle spasms. (Patient not taking: Reported on 09/27/2021) 20 tablet 0   No current facility-administered medications for this visit.    Review of Systems Review of Systems  All other systems reviewed and are negative.  Blood pressure 126/82, pulse 86, weight 139 lb 11.2 oz (63.4 kg), last menstrual period 10/04/2021.  Physical Exam Physical Exam Vitals and nursing note reviewed. Exam conducted with a chaperone present.  Constitutional:      Appearance: Normal appearance.  Genitourinary:    General: Normal vulva.     Exam position: Lithotomy position.     Vagina: Normal.     Cervix: Normal.  Neurological:     Mental Status: She is alert.  Psychiatric:         Mood and Affect: Mood normal.        Behavior: Behavior normal.  Patient given informed consent, signed copy in the chart, time out was performed.  Placed in lithotomy position. Cervix viewed with speculum and colposcope after application of acetic acid.   Colposcopy adequate?  yes Acetowhite lesions?yes anterior and posterior at SCJ, TZ Punctation?no Mosaicism?  no Abnormal vasculature?  no Biopsies?12 and 5 ECC? yes  COMMENTS: Patient was given post procedure instructions.      Data Reviewed Pap 08/2021  Assessment    ASCUS with positive high risk HPV cervical       Complications: none.     Plan    Specimens labelled and sent to Pathology. If LGSIL repeat pap in 12 months      Darlene Ortiz 10/13/2021, 9:06 AM

## 2021-10-13 NOTE — Addendum Note (Signed)
Addended by: Cline Crock on: 10/13/2021 09:14 AM   Modules accepted: Orders

## 2021-10-18 ENCOUNTER — Telehealth: Payer: Self-pay | Admitting: Lactation Services

## 2021-10-18 LAB — SURGICAL PATHOLOGY

## 2021-10-18 NOTE — Telephone Encounter (Signed)
-----   Message from Adam Phenix, MD sent at 10/18/2021  3:35 PM EST ----- LSIL, repeat pap in 12 months

## 2021-10-18 NOTE — Telephone Encounter (Signed)
Called patient to inform her that her Colposcopy showed milk changes. Advised patient to follow up for a repeat pap smear in 1 year. Advised she needs to call the office in August or September to schedule her follow up appointment. Patient voiced understanding with no questions or concerns at this time.

## 2021-12-13 ENCOUNTER — Emergency Department (HOSPITAL_COMMUNITY)
Admission: EM | Admit: 2021-12-13 | Discharge: 2021-12-14 | Disposition: A | Discharge status: Home / Self Care | Payer: Self-pay | Attending: Physician Assistant | Admitting: Physician Assistant

## 2021-12-13 DIAGNOSIS — Z5321 Procedure and treatment not carried out due to patient leaving prior to being seen by health care provider: Secondary | ICD-10-CM | POA: Insufficient documentation

## 2021-12-13 DIAGNOSIS — X58XXXA Exposure to other specified factors, initial encounter: Secondary | ICD-10-CM | POA: Insufficient documentation

## 2021-12-13 DIAGNOSIS — H5789 Other specified disorders of eye and adnexa: Secondary | ICD-10-CM | POA: Insufficient documentation

## 2021-12-13 DIAGNOSIS — S01111A Laceration without foreign body of right eyelid and periocular area, initial encounter: Secondary | ICD-10-CM | POA: Insufficient documentation

## 2021-12-13 NOTE — ED Provider Triage Note (Signed)
Emergency Medicine Provider Triage Evaluation Note  Darlene Ortiz , a 26 y.o. female  was evaluated in triage.  Pt complains of pain and swelling around her right eye.  Patient has multiple small lacerations around the right upper eyelid as well as the right eyebrow.  Patient states she had "1 to 2 glasses of Chardonnay tonight".  Patient will not provide any answers regarding drug use or any history regarding what happened to her right eye.  She states her Tdap is up-to-date.  Physical Exam  There were no vitals taken for this visit. Gen:   Awake, no distress   Resp:  Normal effort  MSK:   Moves extremities without difficulty  Other:  Right-sided periorbital swelling.  Small well approximated laceration just right of the eye with an additional 1 to the upper eyelid.  No active bleeding from either site.  Additional small well approximated laceration to the right eyebrow.  Mild active bleeding.  Medical Decision Making  Medically screening exam initiated at 11:45 PM.  Appropriate orders placed.  Tayler Heiden was informed that the remainder of the evaluation will be completed by another provider, this initial triage assessment does not replace that evaluation, and the importance of remaining in the ED until their evaluation is complete.   Placido Sou, PA-C 12/13/21 2346

## 2021-12-14 ENCOUNTER — Emergency Department (HOSPITAL_COMMUNITY)
Admission: EM | Admit: 2021-12-14 | Discharge: 2021-12-15 | Disposition: A | Discharge status: Home / Self Care | Payer: Medicaid Other | Attending: Emergency Medicine | Admitting: Emergency Medicine

## 2021-12-14 ENCOUNTER — Emergency Department (HOSPITAL_COMMUNITY): Payer: Self-pay

## 2021-12-14 ENCOUNTER — Other Ambulatory Visit: Payer: Self-pay

## 2021-12-14 ENCOUNTER — Encounter (HOSPITAL_COMMUNITY): Payer: Self-pay | Admitting: Emergency Medicine

## 2021-12-14 ENCOUNTER — Ambulatory Visit (HOSPITAL_COMMUNITY)
Admission: EM | Admit: 2021-12-14 | Discharge: 2021-12-14 | Disposition: A | Discharge status: Home / Self Care | Payer: Self-pay

## 2021-12-14 ENCOUNTER — Encounter (HOSPITAL_COMMUNITY): Payer: Self-pay

## 2021-12-14 DIAGNOSIS — S0591XA Unspecified injury of right eye and orbit, initial encounter: Secondary | ICD-10-CM | POA: Insufficient documentation

## 2021-12-14 DIAGNOSIS — S0285XA Fracture of orbit, unspecified, initial encounter for closed fracture: Secondary | ICD-10-CM

## 2021-12-14 LAB — COMPREHENSIVE METABOLIC PANEL
ALT: 13 U/L (ref 0–44)
AST: 19 U/L (ref 15–41)
Albumin: 4.2 g/dL (ref 3.5–5.0)
Alkaline Phosphatase: 62 U/L (ref 38–126)
Anion gap: 10 (ref 5–15)
BUN: 10 mg/dL (ref 6–20)
CO2: 21 mmol/L — ABNORMAL LOW (ref 22–32)
Calcium: 9.2 mg/dL (ref 8.9–10.3)
Chloride: 104 mmol/L (ref 98–111)
Creatinine, Ser: 0.85 mg/dL (ref 0.44–1.00)
GFR, Estimated: >60 mL/min (ref >60)
Glucose, Bld: 109 mg/dL — ABNORMAL HIGH (ref 70–99)
Potassium: 3.1 mmol/L — ABNORMAL LOW (ref 3.5–5.1)
Sodium: 135 mmol/L (ref 135–145)
Total Bilirubin: 0.3 mg/dL (ref 0.3–1.2)
Total Protein: 7.7 g/dL (ref 6.5–8.1)

## 2021-12-14 LAB — CBC WITH DIFFERENTIAL/PLATELET
Abs Immature Granulocytes: 0.01 10*3/uL (ref 0.00–0.07)
Basophils Absolute: 0 10*3/uL (ref 0.0–0.1)
Basophils Relative: 1 %
Eosinophils Absolute: 0.2 10*3/uL (ref 0.0–0.5)
Eosinophils Relative: 3 %
HCT: 32.1 % — ABNORMAL LOW (ref 36.0–46.0)
Hemoglobin: 10.2 g/dL — ABNORMAL LOW (ref 12.0–15.0)
Immature Granulocytes: 0 %
Lymphocytes Relative: 47 %
Lymphs Abs: 2.3 10*3/uL (ref 0.7–4.0)
MCH: 26.4 pg (ref 26.0–34.0)
MCHC: 31.8 g/dL (ref 30.0–36.0)
MCV: 82.9 fL (ref 80.0–100.0)
Monocytes Absolute: 0.5 10*3/uL (ref 0.1–1.0)
Monocytes Relative: 10 %
Neutro Abs: 1.9 10*3/uL (ref 1.7–7.7)
Neutrophils Relative %: 39 %
Platelets: 335 10*3/uL (ref 150–400)
RBC: 3.87 MIL/uL (ref 3.87–5.11)
RDW: 14.7 % (ref 11.5–15.5)
WBC: 4.9 10*3/uL (ref 4.0–10.5)
nRBC: 0 % (ref 0.0–0.2)

## 2021-12-14 LAB — RAPID URINE DRUG SCREEN, HOSP PERFORMED
Amphetamines: NOT DETECTED
Barbiturates: NOT DETECTED
Benzodiazepines: NOT DETECTED
Cocaine: NOT DETECTED
Opiates: NOT DETECTED
Tetrahydrocannabinol: POSITIVE — AB

## 2021-12-14 LAB — ETHANOL: Alcohol, Ethyl (B): 41 mg/dL — ABNORMAL HIGH (ref <10)
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
## 2021-12-14 NOTE — ED Notes (Signed)
Patient states her right eye has worsening pain and she cant see out of it anymore. Triage RN Victorino Dike notified
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
## 2021-12-14 NOTE — ED Notes (Signed)
Pt said she was not waiting, LWBS
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
## 2021-12-15 NOTE — Discharge Instructions (Signed)
You were seen in the ED with right eye pain and swelling around the eye. Your CT scan shows broken bones surrounding the right eye. You can apply a cool compress to reduce swelling. Please call the ENT office listed to schedule a follow up appointment in the next 24-48 hours. If you need to sneeze please open our mouth while sneezing and do not blow your nose.   Return to the ED immediately if your vision worsens or if you develop any new or worsening pain.
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
## 2021-12-22 DIAGNOSIS — S0285XA Fracture of orbit, unspecified, initial encounter for closed fracture: Secondary | ICD-10-CM | POA: Insufficient documentation

## 2022-02-20 ENCOUNTER — Emergency Department (HOSPITAL_COMMUNITY)
Admission: EM | Admit: 2022-02-20 | Discharge: 2022-02-20 | Disposition: A | Discharge status: Home / Self Care | Payer: Medicaid Other | Attending: Emergency Medicine | Admitting: Emergency Medicine

## 2022-02-20 ENCOUNTER — Encounter (HOSPITAL_COMMUNITY): Payer: Self-pay

## 2022-02-20 DIAGNOSIS — J029 Acute pharyngitis, unspecified: Secondary | ICD-10-CM | POA: Insufficient documentation

## 2022-02-20 LAB — GROUP A STREP BY PCR: Group A Strep by PCR: NOT DETECTED

## 2022-02-20 LAB — MONONUCLEOSIS SCREEN: Mono Screen: NEGATIVE

## 2022-02-20 MED ORDER — ACETAMINOPHEN 500 MG PO TABS
1000.0000 mg | ORAL_TABLET | Freq: Four times a day (QID) | ORAL | Status: DC | PRN
  Administered 2022-02-20: 1000 mg via ORAL
  Filled 2022-02-20: qty 2

## 2022-02-20 MED ORDER — DEXAMETHASONE 10 MG/ML FOR PEDIATRIC ORAL USE
10.0000 mg | Freq: Once | INTRAMUSCULAR | Status: DC
  Filled 2022-02-20: qty 1

## 2022-02-20 NOTE — ED Triage Notes (Signed)
Pt presents with c/o sore throat that started over the weekend.  ?

## 2022-02-20 NOTE — Discharge Instructions (Signed)
Seen in the emergency room today with sore throat.  Your strep and monotest came back negative.  I suspect you have a virus causing your sore throat.  I have given you a dose of steroid which will work over the next couple of days to reduce swelling and decrease your symptoms.  You may take Tylenol and/or ibuprofen as needed for discomfort.  Drink plenty of fluids and return with any new or suddenly worsening symptoms. ?

## 2022-02-20 NOTE — ED Provider Notes (Signed)
? ?  Emergency Department Provider Note ? ? ?I have reviewed the triage vital signs and the nursing notes. ? ? ?HISTORY ? ?Chief Complaint ?Sore Throat ? ? ?HPI ?Darlene Ortiz is a 26 y.o. female with no contributory history presents to the ED w/ sore throat.  Symptoms worsening over the past several days.  No fevers.  No difficulty swallowing but does have pain with swallowing.  No shortness of breath.  No chest pain. ? ? ? ?Past Medical History:  ?Diagnosis Date  ? Medical history non-contributory   ? ? ?Review of Systems ? ?Constitutional: No fever/chills ?ENT: Positive sore throat. ?Cardiovascular: Denies chest pain. ?Respiratory: Denies shortness of breath. ?Gastrointestinal: No abdominal pain.  ? ?____________________________________________ ? ? ?PHYSICAL EXAM: ? ?VITAL SIGNS: ?ED Triage Vitals  ?Enc Vitals Group  ?   BP 02/20/22 1403 (!) 153/99  ?   Pulse Rate 02/20/22 1403 (!) 110  ?   Resp 02/20/22 1403 16  ?   Temp 02/20/22 1403 99.3 ?F (37.4 ?C)  ?   Temp Source 02/20/22 1403 Oral  ?   SpO2 02/20/22 1403 99 %  ?   Weight 02/20/22 1403 140 lb (63.5 kg)  ?   Height 02/20/22 1403 5\' 7"  (1.702 m)  ? ?Constitutional: Alert and oriented. Well appearing and in no acute distress. ?Eyes: Conjunctivae are normal.  ?Head: Atraumatic. ?Nose: No congestion/rhinnorhea. ?Mouth/Throat: Mucous membranes are moist.  Oropharynx with bilateral tonsillar hypertrophy and exudate. No PTA. Clear voice. Managing oral secretions.  ?Neck: No stridor.  ?Cardiovascular: Good peripheral circulation.  ?Respiratory: Normal respiratory effort.  No retractions. Lungs CTAB. ?Musculoskeletal: No gross deformities of extremities. ?Neurologic:  Normal speech and language. ?Skin:  Skin is warm, dry and intact. No rash noted. ? ?____________________________________________ ?  ?LABS ?(all labs ordered are listed, but only abnormal results are displayed) ? ?Labs Reviewed  ?GROUP A STREP BY PCR  ?MONONUCLEOSIS SCREEN   ? ?____________________________________________ ? ? ?PROCEDURES ? ?Procedure(s) performed:  ? ?Procedures ? ?None  ?____________________________________________ ? ? ?INITIAL IMPRESSION / ASSESSMENT AND PLAN / ED COURSE ? ?Pertinent labs & imaging results that were available during my care of the patient were reviewed by me and considered in my medical decision making (see chart for details). ? ?  ?Clinical Laboratory Tests Ordered, included Mono and Strep PCR, both are negative.  ? ?Radiologic Tests: Considered need for soft tissue neck imaging but patient's exam consistent with bilateral tonsillar hypertrophy but no hard signs of deeper space infection.  ? ? ?Medical Decision Making: Summary:  ?Patient presents emergency department with sore throat.  No evidence of peritonsillar abscess on exam.  Tonsillar hypertrophy with exudate.  Plan for strep and mono testing.  No URI symptoms to suspect COVID or Flu.  ? ?Reevaluation with update and discussion with patient.  Plan for Decadron dose here to reduce inflammation and decrease symptoms. Strep and Mono negative. Denies any oral sexual contact or concern for STI.  ? ?Disposition: discahrge ? ?____________________________________________ ? ?FINAL CLINICAL IMPRESSION(S) / ED DIAGNOSES ? ?Final diagnoses:  ?Viral pharyngitis  ? ? ?Note:  This document was prepared using Dragon voice recognition software and may include unintentional dictation errors. ? ? , MD, [facility] ?Emergency Medicine ? ?  ?Alona Bene, MD ?02/20/22 1721 ? ?

## 2022-03-14 ENCOUNTER — Ambulatory Visit
Admission: RE | Admit: 2022-03-14 | Discharge: 2022-03-14 | Disposition: A | Discharge status: Home / Self Care | Payer: Medicaid Other | Source: Ambulatory Visit | Attending: Physician Assistant | Admitting: Physician Assistant

## 2022-03-14 VITALS — BP 128/80 | HR 87 | Temp 98.0°F | Resp 14

## 2022-03-14 DIAGNOSIS — Z113 Encounter for screening for infections with a predominantly sexual mode of transmission: Secondary | ICD-10-CM | POA: Insufficient documentation

## 2022-03-14 DIAGNOSIS — Z202 Contact with and (suspected) exposure to infections with a predominantly sexual mode of transmission: Secondary | ICD-10-CM

## 2022-03-14 NOTE — ED Provider Notes (Signed)
?[facility] ? ? ? ?CSN: [id number] ?Arrival date & time: 03/14/22  1011 ? ? ?  ? ?History   ?Chief Complaint ?Chief Complaint  ?Patient presents with  ? SEXUALLY TRANSMITTED DISEASE  ?  Vaginal Odor - Entered by patient  ? ? ?HPI ?Darlene Ortiz is a 26 y.o. female.  ? ?Patient here today for evaluation of vaginal odor she has experienced the last several days. She has not had any other symptoms. She would like STD screening. She denies any known STD exposures.  ? ?The history is provided by the patient.  ? ?Past Medical History:  ?Diagnosis Date  ? Medical history non-contributory   ? ? ?There are no problems to display for this patient. ? ? ?Past Surgical History:  ?Procedure Laterality Date  ? NO PAST SURGERIES    ? ? ?OB History   ? ? Gravida  ?1  ? Para  ?   ? Term  ?   ? Preterm  ?   ? AB  ?1  ? Living  ?   ?  ? ? SAB  ?   ? IAB  ?1  ? Ectopic  ?   ? Multiple  ?   ? Live Births  ?   ?   ?  ?  ? ? ? ?Home Medications   ? ?Prior to Admission medications   ?Medication Sig Start Date End Date Taking? Authorizing Provider  ?ibuprofen (ADVIL) 200 MG tablet Take 200-400 mg by mouth as needed for moderate pain.    [provider]  ?methocarbamol (ROBAXIN) 500 MG tablet Take 1 tablet (500 mg total) by mouth 2 (two) times daily as needed for muscle spasms. ?Patient not taking: Reported on 09/27/2021 03/22/21   Emeline Darling, PA-C  ? ? ?Family History ?Family History  ?Problem Relation Age of Onset  ? Diabetes Mother   ? Breast cancer Maternal Grandmother   ? ? ?Social History ?Social History  ? ?Tobacco Use  ? Smoking status: Never  ? Smokeless tobacco: Never  ?Vaping Use  ? Vaping Use: Never used  ?Substance Use Topics  ? Alcohol use: Yes  ?  Comment: occassionally  ? Drug use: Not Currently  ?  Types: Marijuana  ?  Comment: May 2021  ? ? ? ?Allergies   ?Patient has no known allergies. ? ? ?Review of Systems ?Review of Systems  ?Constitutional:  Negative for chills and fever.  ?Eyes:  Negative  for discharge and redness.  ?Gastrointestinal:  Negative for abdominal pain, nausea and vomiting.  ?Genitourinary:  Negative for pelvic pain and vaginal discharge.  ? ? ?Physical Exam ?Triage Vital Signs ?ED Triage Vitals  ?Enc Vitals Group  ?   BP   ?   Pulse   ?   Resp   ?   Temp   ?   Temp src   ?   SpO2   ?   Weight   ?   Height   ?   Head Circumference   ?   Peak Flow   ?   Pain Score   ?   Pain Loc   ?   Pain Edu?   ?   Excl. in [place]?   ? ?No data found. ? ?Updated Vital Signs ?BP 128/80 (BP Location: Left Arm)   Pulse 87   Temp 98 ?F (36.7 ?C) (Oral)   Resp 14   SpO2 98%  ? ?Physical Exam ?Vitals and nursing note reviewed.  ?  Constitutional:   ?   General: She is not in acute distress. ?   Appearance: Normal appearance. She is not ill-appearing.  ?HENT:  ?   Head: Normocephalic and atraumatic.  ?Eyes:  ?   Conjunctiva/sclera: Conjunctivae normal.  ?Cardiovascular:  ?   Rate and Rhythm: Normal rate.  ?Pulmonary:  ?   Effort: Pulmonary effort is normal.  ?Neurological:  ?   Mental Status: She is alert.  ?Psychiatric:     ?   Mood and Affect: Mood normal.     ?   Behavior: Behavior normal.     ?   Thought Content: Thought content normal.  ? ? ? ?UC Treatments / Results  ?Labs ?(all labs ordered are listed, but only abnormal results are displayed) ?Labs Reviewed  ?CERVICOVAGINAL ANCILLARY ONLY  ? ? ?EKG ? ? ?Radiology ?No results found. ? ?Procedures ?Procedures (including critical care time) ? ?Medications Ordered in UC ?Medications - No data to display ? ?Initial Impression / Assessment and Plan / UC Course  ?I have reviewed the triage vital signs and the nursing notes. ? ?Pertinent labs & imaging results that were available during my care of the patient were reviewed by me and considered in my medical decision making (see chart for details). ? ?  ?Screening ordered for STDs as well as BV and yeast. Will await results for further recommendation. Patient declines blood testing at this time.  ? ?Final Clinical  Impressions(s) / UC Diagnoses  ? ?Final diagnoses:  ?Screening for STD (sexually transmitted disease)  ? ?Discharge Instructions   ?None ?  ? ?ED Prescriptions   ?None ?  ? ?PDMP not reviewed this encounter. ?  ?Francene Finders, PA-C ?03/14/22 1038 ? ?

## 2022-03-14 NOTE — ED Triage Notes (Signed)
C/o vaginal odor for several days. She would like STD testing. ?

## 2022-03-15 ENCOUNTER — Telehealth (HOSPITAL_COMMUNITY): Payer: Self-pay | Admitting: Emergency Medicine

## 2022-03-15 LAB — CERVICOVAGINAL ANCILLARY ONLY
Bacterial Vaginitis (gardnerella): POSITIVE — AB
Candida Glabrata: NEGATIVE
Candida Vaginitis: NEGATIVE
Chlamydia: NEGATIVE
Comment: NEGATIVE
Comment: NEGATIVE
Comment: NEGATIVE
Comment: NEGATIVE
Comment: NEGATIVE
Comment: NORMAL
Neisseria Gonorrhea: NEGATIVE
Trichomonas: NEGATIVE

## 2022-03-15 MED ORDER — METRONIDAZOLE 500 MG PO TABS: 500.0000 mg | ORAL_TABLET | Freq: Two times a day (BID) | ORAL | 0 refills | Status: DC

## 2022-04-10 ENCOUNTER — Ambulatory Visit (HOSPITAL_COMMUNITY)
Admission: RE | Admit: 2022-04-10 | Discharge: 2022-04-10 | Disposition: A | Discharge status: Home / Self Care | Payer: Self-pay | Source: Ambulatory Visit | Attending: Emergency Medicine | Admitting: Emergency Medicine

## 2022-04-10 ENCOUNTER — Encounter (HOSPITAL_COMMUNITY): Payer: Self-pay

## 2022-04-10 VITALS — BP 135/87 | HR 84 | Temp 97.9°F

## 2022-04-10 DIAGNOSIS — H6123 Impacted cerumen, bilateral: Secondary | ICD-10-CM

## 2022-04-10 MED ORDER — FLUOCINOLONE ACETONIDE 0.01 % OT OIL: 5.0000 [drp] | TOPICAL_OIL | Freq: Two times a day (BID) | OTIC | 0 refills | Status: DC | PRN

## 2022-04-10 NOTE — ED Triage Notes (Signed)
Pt presents with bilateral ear fullness X 1 month. ?

## 2022-04-10 NOTE — Discharge Instructions (Signed)
Both of your ears are completely clear of wax at this time.  I recommend that you use DermOtic oil in each ear, a few drops once a week to keep them calm, uninflamed and hopefully reduce wax production. ? ?Thank you for visiting urgent care today.  Apologize for the long wait.  Please let us know if there is anything else we can do for you. ?

## 2022-04-10 NOTE — ED Provider Notes (Signed)
?[facility] ? ? ? ?CSN: [id number] ?Arrival date & time: 04/10/22  1524 ?  ? ?HISTORY  ? ?Chief Complaint  ?Patient presents with  ? APPOINTMENT:Ear Fullness  ? ?HPI ?Darlene Ortiz is a 26 y.o. female. Pt presents with bilateral ear fullness X 1 month.   Patient states that in the past few years she has noticed that she has more earwax than she had when she was a child.  Patient states she has not lived in this area her whole life.  Patient states she purchased an over-the-counter earwax removal kit with a small bulb mentioned, did not feel that she was able to remove meaningful amount of wax.  Patient states sometimes in the morning she cannot hear and has to juggle her ears on both sides so that she can hear.  Patient denies ear pain at this time. ? ?The history is provided by the patient.  ?Past Medical History:  ?Diagnosis Date  ? Medical history non-contributory   ? ?There are no problems to display for this patient. ? ?Past Surgical History:  ?Procedure Laterality Date  ? NO PAST SURGERIES    ? ?OB History   ? ? Gravida  ?1  ? Para  ?   ? Term  ?   ? Preterm  ?   ? AB  ?1  ? Living  ?   ?  ? ? SAB  ?   ? IAB  ?1  ? Ectopic  ?   ? Multiple  ?   ? Live Births  ?   ?   ?  ?  ? ?Home Medications   ? ?Prior to Admission medications   ?Medication Sig Start Date End Date Taking? Authorizing Provider  ?ibuprofen (ADVIL) 200 MG tablet Take 200-400 mg by mouth as needed for moderate pain.    [provider]  ?methocarbamol (ROBAXIN) 500 MG tablet Take 1 tablet (500 mg total) by mouth 2 (two) times daily as needed for muscle spasms. ?Patient not taking: Reported on 09/27/2021 03/22/21   Sponseller, Gypsy Balsam, PA-C  ?metroNIDAZOLE (FLAGYL) 500 MG tablet Take 1 tablet (500 mg total) by mouth 2 (two) times daily. 03/15/22   Lamptey, Myrene Galas, MD  ? ?Family History ?Family History  ?Problem Relation Age of Onset  ? Diabetes Mother   ? Breast cancer Maternal Grandmother   ? ?Social History ?Social History   ? ?Tobacco Use  ? Smoking status: Never  ? Smokeless tobacco: Never  ?Vaping Use  ? Vaping Use: Never used  ?Substance Use Topics  ? Alcohol use: Yes  ?  Comment: occassionally  ? Drug use: Not Currently  ?  Types: Marijuana  ?  Comment: May 2021  ? ?Allergies   ?Patient has no known allergies. ? ?Review of Systems ?Review of Systems ?Pertinent findings noted in history of present illness.  ? ?Physical Exam ?Triage Vital Signs ?ED Triage Vitals  ?Enc Vitals Group  ?   BP 09/30/21 0827 (!) 147/82  ?   Pulse Rate 09/30/21 0827 72  ?   Resp 09/30/21 0827 18  ?   Temp 09/30/21 0827 98.3 ?F (36.8 ?C)  ?   Temp Source 09/30/21 0827 Oral  ?   SpO2 09/30/21 0827 98 %  ?   Weight --   ?   Height --   ?   Head Circumference --   ?   Peak Flow --   ?   Pain Score 09/30/21 0826 5  ?  Pain Loc --   ?   Pain Edu? --   ?   Excl. in [place]? --   ?No data found. ? ?Updated Vital Signs ?BP 135/87 (BP Location: Left Arm)   Pulse 84   Temp 97.9 ?F (36.6 ?C) (Oral)   LMP 04/07/2022   SpO2 100%  ? ?Physical Exam ?Vitals and nursing note reviewed.  ?Constitutional:   ?   General: She is not in acute distress. ?   Appearance: Normal appearance. She is not ill-appearing.  ?HENT:  ?   Head: Normocephalic and atraumatic.  ?   Salivary Glands: Right salivary gland is not diffusely enlarged or tender. Left salivary gland is not diffusely enlarged or tender.  ?   Right Ear: Tympanic membrane, ear canal and external ear normal. There is impacted cerumen.  ?   Left Ear: Tympanic membrane, ear canal and external ear normal. There is impacted cerumen.  ?   Nose: Nose normal. No nasal deformity, septal deviation, mucosal edema, congestion or rhinorrhea.  ?   Right Turbinates: Not enlarged, swollen or pale.  ?   Left Turbinates: Not enlarged, swollen or pale.  ?   Right Sinus: No maxillary sinus tenderness or frontal sinus tenderness.  ?   Left Sinus: No maxillary sinus tenderness or frontal sinus tenderness.  ?   Mouth/Throat:  ?   Lips: Pink. No  lesions.  ?   Mouth: Mucous membranes are moist. No oral lesions.  ?   Pharynx: Oropharynx is clear. Uvula midline. No posterior oropharyngeal erythema or uvula swelling.  ?   Tonsils: No tonsillar exudate. 0 on the right. 0 on the left.  ?Eyes:  ?   General: Lids are normal.     ?   Right eye: No discharge.     ?   Left eye: No discharge.  ?   Extraocular Movements: Extraocular movements intact.  ?   Conjunctiva/sclera: Conjunctivae normal.  ?   Right eye: Right conjunctiva is not injected.  ?   Left eye: Left conjunctiva is not injected.  ?Neck:  ?   Trachea: Trachea and phonation normal.  ?Cardiovascular:  ?   Rate and Rhythm: Normal rate and regular rhythm.  ?   Pulses: Normal pulses.  ?   Heart sounds: Normal heart sounds. No murmur heard. ?  No friction rub. No gallop.  ?Pulmonary:  ?   Effort: Pulmonary effort is normal. No accessory muscle usage, prolonged expiration or respiratory distress.  ?   Breath sounds: Normal breath sounds. No stridor, decreased air movement or transmitted upper airway sounds. No decreased breath sounds, wheezing, rhonchi or rales.  ?Chest:  ?   Chest wall: No tenderness.  ?Musculoskeletal:     ?   General: Normal range of motion.  ?   Cervical back: Normal range of motion and neck supple. Normal range of motion.  ?Lymphadenopathy:  ?   Cervical: No cervical adenopathy.  ?Skin: ?   General: Skin is warm and dry.  ?   Findings: No erythema or rash.  ?Neurological:  ?   General: No focal deficit present.  ?   Mental Status: She is alert and oriented to person, place, and time.  ?Psychiatric:     ?   Mood and Affect: Mood normal.     ?   Behavior: Behavior normal.  ? ? ?Visual Acuity ?Right Eye Distance:   ?Left Eye Distance:   ?Bilateral Distance:   ? ?Right Eye Near:   ?Left  Eye Near:    ?Bilateral Near:    ? ?UC Couse / Diagnostics / Procedures:  ?  ?EKG ? ?Radiology ?No results found. ? ?Procedures ?Ear Cerumen Removal ? ?Date/Time: 04/10/2022 4:02 PM ?Performed by: Leodis Sias,  CMA ?Authorized by: Lynden Oxford Scales, PA-C  ? ?Consent:  ?  Consent obtained:  Verbal ?  Consent given by:  Patient ?  Risks, benefits, and alternatives were discussed: yes   ?  Risks discussed:  Bleeding, dizziness, infection, incomplete removal, pain and TM perforation ?  Alternatives discussed:  No treatment, delayed treatment, alternative treatment, observation and referral ?Universal protocol:  ?  Procedure explained and questions answered to patient or proxy's satisfaction: yes   ?  Patient identity confirmed:  Verbally with patient and arm band ?Procedure details:  ?  Location:  R ear and L ear ?  Procedure type: irrigation   ?  Procedure outcomes: cerumen removed   ?Post-procedure details:  ?  Inspection:  Ear canal clear, no bleeding and TM intact ?  Hearing quality:  Normal ?  Procedure completion:  Tolerated (including critical care time) ? ?UC Diagnoses / Final Clinical Impressions(s)   ?I have reviewed the triage vital signs and the nursing notes. ? ?Pertinent labs & imaging results that were available during my care of the patient were reviewed by me and considered in my medical decision making (see chart for details).   ?Final diagnoses:  ?Bilateral impacted cerumen  ? ?Patient's ears were successfully irrigated by the RN during her visit today.  Patient provided with Derm otic oil to hopefully calm her ears and reduce her cerumen production which is likely being triggered by increased allergy symptoms since she moved to [facility].  Return precautions advised. ? ?ED Prescriptions   ? ? Medication Sig Dispense Auth. Provider  ? Fluocinolone Acetonide (DERMOTIC) 0.01 % OIL Place 5 drops in ear(s) 2 (two) times daily as needed (Itching in ears). 20 mL Lynden Oxford Scales, PA-C  ? ?  ? ?PDMP not reviewed this encounter. ? ?Pending results:  ?Labs Reviewed - No data to display ? ?Medications Ordered in UC: ?Medications - No data to display ? ?Disposition Upon Discharge:  ?Condition: stable for  discharge home ?Home: take medications as prescribed; routine discharge instructions as discussed; follow up as advised. ? ?Patient presented with an acute illness with associated systemic symptoms and sign

## 2022-05-02 ENCOUNTER — Ambulatory Visit: Payer: Self-pay

## 2022-07-20 ENCOUNTER — Ambulatory Visit (HOSPITAL_COMMUNITY)
Admission: RE | Admit: 2022-07-20 | Discharge: 2022-07-20 | Disposition: A | Discharge status: Home / Self Care | Payer: Medicaid Other | Source: Ambulatory Visit | Attending: Emergency Medicine | Admitting: Emergency Medicine

## 2022-07-20 ENCOUNTER — Encounter (HOSPITAL_COMMUNITY): Payer: Self-pay

## 2022-07-20 ENCOUNTER — Ambulatory Visit: Payer: Self-pay

## 2022-07-20 VITALS — BP 150/96 | HR 96 | Temp 99.1°F | Resp 16

## 2022-07-20 DIAGNOSIS — N760 Acute vaginitis: Secondary | ICD-10-CM | POA: Insufficient documentation

## 2022-07-20 DIAGNOSIS — N898 Other specified noninflammatory disorders of vagina: Secondary | ICD-10-CM | POA: Insufficient documentation

## 2022-07-20 DIAGNOSIS — B9689 Other specified bacterial agents as the cause of diseases classified elsewhere: Secondary | ICD-10-CM | POA: Insufficient documentation

## 2022-07-20 LAB — POC URINE PREG, ED: Preg Test, Ur: NEGATIVE

## 2022-07-20 MED ORDER — METRONIDAZOLE 500 MG PO TABS: 500.0000 mg | ORAL_TABLET | Freq: Two times a day (BID) | ORAL | 0 refills | Status: AC

## 2022-07-20 NOTE — Discharge Instructions (Addendum)
Please take medication as prescribed. Do not use alcohol while using this medicine.  Please return with any concerns.

## 2022-07-20 NOTE — ED Provider Notes (Signed)
[facility]    CSN: [id number] Arrival date & time: 07/20/22  1810     History   Chief Complaint Chief Complaint  Patient presents with   Vaginal Discharge   APPT 1800    HPI Darlene Ortiz is a 26 y.o. female.  Presents with 2-week history of vaginal discharge. Reports odor present with discharge. No known exposure to STD. Denies any abdominal pain, nausea, vomiting/diarrhea.  Denies urinary symptoms. LMP 7/24  History of BV, most recently April. Reports similar symptoms now  Past Medical History:  Diagnosis Date   Medical history non-contributory     There are no problems to display for this patient.   Past Surgical History:  Procedure Laterality Date   NO PAST SURGERIES      OB History     Gravida  1   Para      Term      Preterm      AB  1   Living         SAB      IAB  1   Ectopic      Multiple      Live Births               Home Medications    Prior to Admission medications   Medication Sig Start Date End Date Taking? Authorizing Provider  metroNIDAZOLE (FLAGYL) 500 MG tablet Take 1 tablet (500 mg total) by mouth 2 (two) times daily for 7 days. 07/20/22 07/27/22 Yes Taetum Flewellen, Lurena Joiner, PA-C  Fluocinolone Acetonide (DERMOTIC) 0.01 % OIL Place 5 drops in ear(s) 2 (two) times daily as needed (Itching in ears). 04/10/22   Theadora Rama Scales, PA-C  ibuprofen (ADVIL) 200 MG tablet Take 200-400 mg by mouth as needed for moderate pain.    [provider]  methocarbamol (ROBAXIN) 500 MG tablet Take 1 tablet (500 mg total) by mouth 2 (two) times daily as needed for muscle spasms. Patient not taking: Reported on 09/27/2021 03/22/21   Sponseller, Eugene Gavia, PA-C    Family History Family History  Problem Relation Age of Onset   Diabetes Mother    Breast cancer Maternal Grandmother     Social History Social History   Tobacco Use   Smoking status: Never   Smokeless tobacco: Never  Vaping Use   Vaping Use: Never used   Substance Use Topics   Alcohol use: Yes    Comment: occassionally   Drug use: Not Currently    Types: Marijuana    Comment: May 2021     Allergies   Patient has no known allergies.   Review of Systems Review of Systems  Genitourinary:  Positive for vaginal discharge.   Per HPI  Physical Exam Triage Vital Signs ED Triage Vitals  Enc Vitals Group     BP 07/20/22 1838 (!) 150/96     Pulse Rate 07/20/22 1838 96     Resp 07/20/22 1838 16     Temp 07/20/22 1838 99.1 F (37.3 C)     Temp Source 07/20/22 1838 Oral     SpO2 07/20/22 1838 96 %     Weight --      Height --      Head Circumference --      Peak Flow --      Pain Score 07/20/22 1842 0     Pain Loc --      Pain Edu? --      Excl. in [place]? --  No data found.  Updated Vital Signs BP (!) 150/96 (BP Location: Left Arm)   Pulse 96   Temp 99.1 F (37.3 C) (Oral)   Resp 16   LMP 06/26/2022 (Exact Date)   SpO2 96%   Visual Acuity Right Eye Distance:   Left Eye Distance:   Bilateral Distance:    Right Eye Near:   Left Eye Near:    Bilateral Near:     Physical Exam Vitals and nursing note reviewed.  Constitutional:      General: She is not in acute distress.    Appearance: Normal appearance.  HENT:     Mouth/Throat:     Pharynx: Oropharynx is clear.  Cardiovascular:     Rate and Rhythm: Normal rate and regular rhythm.     Pulses: Normal pulses.     Heart sounds: Normal heart sounds.  Pulmonary:     Effort: Pulmonary effort is normal.     Breath sounds: Normal breath sounds.  Abdominal:     Tenderness: There is no abdominal tenderness.  Neurological:     Mental Status: She is alert and oriented to person, place, and time.      UC Treatments / Results  Labs (all labs ordered are listed, but only abnormal results are displayed) Labs Reviewed  POC URINE PREG, ED  CERVICOVAGINAL ANCILLARY ONLY    EKG   Radiology No results found.  Procedures Procedures   Medications Ordered in  UC Medications - No data to display  Initial Impression / Assessment and Plan / UC Course  I have reviewed the triage vital signs and the nursing notes.  Pertinent labs & imaging results that were available during my care of the patient were reviewed by me and considered in my medical decision making (see chart for details).  Urine pregnancy negative. Cytology swab pending at this time.  We will treat for BV. Provided women's clinic information. Return precautions discussed. Patient agrees to plan   Final Clinical Impressions(s) / UC Diagnoses   Final diagnoses:  BV (bacterial vaginosis)  Vaginal discharge     Discharge Instructions      Please take medication as prescribed. Do not use alcohol while using this medicine.  Please return with any concerns.     ED Prescriptions     Medication Sig Dispense Auth. Provider   metroNIDAZOLE (FLAGYL) 500 MG tablet Take 1 tablet (500 mg total) by mouth 2 (two) times daily for 7 days. 14 tablet Basma Buchner, Lurena Joiner, PA-C      PDMP not reviewed this encounter.   Rickia Freeburg, Ray Church 07/20/22 1949

## 2022-07-20 NOTE — ED Triage Notes (Signed)
Patient c/o vaginal discharge x 2 weeks.   Patient denies ABD pain. Patient denies dysuria or N/V/D.   Patient endorses abnormal vaginal odor.   Patient hasn't used any medications for symptoms.

## 2022-07-21 ENCOUNTER — Telehealth (HOSPITAL_COMMUNITY): Payer: Self-pay | Admitting: Emergency Medicine

## 2022-07-21 LAB — CERVICOVAGINAL ANCILLARY ONLY
Bacterial Vaginitis (gardnerella): POSITIVE — AB
Candida Glabrata: NEGATIVE
Candida Vaginitis: POSITIVE — AB
Chlamydia: NEGATIVE
Comment: NEGATIVE
Comment: NEGATIVE
Comment: NEGATIVE
Comment: NEGATIVE
Comment: NEGATIVE
Comment: NORMAL
Neisseria Gonorrhea: NEGATIVE
Trichomonas: NEGATIVE

## 2022-07-21 MED ORDER — FLUCONAZOLE 150 MG PO TABS: 150.0000 mg | ORAL_TABLET | Freq: Once | ORAL | 0 refills | Status: AC

## 2022-08-03 ENCOUNTER — Ambulatory Visit: Payer: Self-pay

## 2022-08-03 ENCOUNTER — Encounter (HOSPITAL_COMMUNITY): Payer: Self-pay

## 2022-08-03 ENCOUNTER — Inpatient Hospital Stay (HOSPITAL_COMMUNITY): Payer: Medicaid Other

## 2022-08-03 ENCOUNTER — Other Ambulatory Visit: Payer: Self-pay

## 2022-08-03 ENCOUNTER — Inpatient Hospital Stay (HOSPITAL_COMMUNITY)
Admission: EM | Admit: 2022-08-03 | Discharge: 2022-08-03 | Disposition: A | Discharge status: Home / Self Care | Payer: Medicaid Other | Attending: Obstetrics & Gynecology | Admitting: Obstetrics & Gynecology

## 2022-08-03 DIAGNOSIS — O3680X0 Pregnancy with inconclusive fetal viability, not applicable or unspecified: Secondary | ICD-10-CM | POA: Diagnosis not present

## 2022-08-03 DIAGNOSIS — Z3A01 Less than 8 weeks gestation of pregnancy: Secondary | ICD-10-CM | POA: Diagnosis not present

## 2022-08-03 DIAGNOSIS — R1030 Lower abdominal pain, unspecified: Secondary | ICD-10-CM | POA: Diagnosis not present

## 2022-08-03 DIAGNOSIS — B9689 Other specified bacterial agents as the cause of diseases classified elsewhere: Secondary | ICD-10-CM | POA: Diagnosis not present

## 2022-08-03 DIAGNOSIS — O23591 Infection of other part of genital tract in pregnancy, first trimester: Secondary | ICD-10-CM | POA: Insufficient documentation

## 2022-08-03 DIAGNOSIS — R109 Unspecified abdominal pain: Secondary | ICD-10-CM

## 2022-08-03 DIAGNOSIS — O26891 Other specified pregnancy related conditions, first trimester: Secondary | ICD-10-CM | POA: Diagnosis present

## 2022-08-03 LAB — COMPREHENSIVE METABOLIC PANEL
ALT: 11 U/L (ref 0–44)
AST: 16 U/L (ref 15–41)
Albumin: 4.1 g/dL (ref 3.5–5.0)
Alkaline Phosphatase: 38 U/L (ref 38–126)
Anion gap: 8 (ref 5–15)
BUN: 6 mg/dL (ref 6–20)
CO2: 24 mmol/L (ref 22–32)
Calcium: 9.4 mg/dL (ref 8.9–10.3)
Chloride: 105 mmol/L (ref 98–111)
Creatinine, Ser: 0.68 mg/dL (ref 0.44–1.00)
GFR, Estimated: >60 mL/min (ref >60)
Glucose, Bld: 95 mg/dL (ref 70–99)
Potassium: 3.2 mmol/L — ABNORMAL LOW (ref 3.5–5.1)
Sodium: 137 mmol/L (ref 135–145)
Total Bilirubin: 0.5 mg/dL (ref 0.3–1.2)
Total Protein: 7.5 g/dL (ref 6.5–8.1)

## 2022-08-03 LAB — URINALYSIS, ROUTINE W REFLEX MICROSCOPIC
Bilirubin Urine: NEGATIVE
Glucose, UA: NEGATIVE mg/dL
Hgb urine dipstick: NEGATIVE
Ketones, ur: NEGATIVE mg/dL
Leukocytes,Ua: NEGATIVE
Nitrite: NEGATIVE
Protein, ur: NEGATIVE mg/dL
Specific Gravity, Urine: 1.02 (ref 1.005–1.030)
pH: 6 (ref 5.0–8.0)

## 2022-08-03 LAB — WET PREP, GENITAL
Sperm: NONE SEEN
Trich, Wet Prep: NONE SEEN
WBC, Wet Prep HPF POC: <10 (ref <10)
Yeast Wet Prep HPF POC: NONE SEEN

## 2022-08-03 LAB — I-STAT BETA HCG BLOOD, ED (NOT ORDERABLE): I-stat hCG, quantitative: 438.4 m[IU]/mL — ABNORMAL HIGH (ref <5)

## 2022-08-03 LAB — CBC
HCT: 30.8 % — ABNORMAL LOW (ref 36.0–46.0)
Hemoglobin: 9.5 g/dL — ABNORMAL LOW (ref 12.0–15.0)
MCH: 23.5 pg — ABNORMAL LOW (ref 26.0–34.0)
MCHC: 30.8 g/dL (ref 30.0–36.0)
MCV: 76 fL — ABNORMAL LOW (ref 80.0–100.0)
Platelets: 387 10*3/uL (ref 150–400)
RBC: 4.05 MIL/uL (ref 3.87–5.11)
RDW: 16 % — ABNORMAL HIGH (ref 11.5–15.5)
WBC: 3.4 10*3/uL — ABNORMAL LOW (ref 4.0–10.5)
nRBC: 0 % (ref 0.0–0.2)

## 2022-08-03 LAB — PREGNANCY, URINE: Preg Test, Ur: POSITIVE — AB

## 2022-08-03 MED ORDER — PRENATAL 28-0.8 MG PO TABS: 1.0000 | ORAL_TABLET | Freq: Every day | ORAL | 12 refills | Status: DC

## 2022-08-03 NOTE — Progress Notes (Signed)
Written and verbal d/c instructions given and understanding voiced. Pt will return Sat evening or Sunday am for repeat BHCG or sooner for any pregnancy concerns

## 2022-08-03 NOTE — MAU Note (Addendum)
.  Darlene Ortiz is a 26 y.o. at [redacted]w[redacted]d here in [facility] reporting lower abdominal cramping for a week and scant bleeding after intercourse on Tues. No pain currently. Finished treatment for BV last Fri and does not feel like it has gone away. Still having some white d/c with odor. Just found out she was pregnant today. Came over from main ED  Onset of complaint: a week Pain score: 0 Vitals:   08/03/22 2217 08/03/22 2218  BP:  135/79  Pulse: 87   Resp: 16   Temp: 98.6 F (37 C)   SpO2: 100%      FHT:n/a Lab orders placed from triage:  none

## 2022-08-03 NOTE — ED Provider Triage Note (Addendum)
Emergency Medicine Provider Triage Evaluation Note  Darlene Ortiz , a 26 y.o. female  was evaluated in triage.  Pt complains of lower abdominal pain and vaginal discharge x 2 weeks. Went to UC on 8/17 and tested positive for BV and yeast, negative for G/C and trich.   Feels more comfortable self swabbing  Review of Systems  Positive: As above, vaginal itching Negative: Fevers, chills, dysuria, nausea  Physical Exam  BP (!) 149/86 (BP Location: Right Arm)   Pulse 92   Temp 98.7 F (37.1 C) (Oral)   Resp 14   Ht 5\' 7"  (1.702 m)   Wt 63.5 kg   LMP 06/26/2022 (Exact Date)   SpO2 100%   BMI 21.93 kg/m  Gen:   Awake, no distress   Resp:  Normal effort  MSK:   Moves extremities without difficulty  Other:    Medical Decision Making  Medically screening exam initiated at 6:37 PM.  Appropriate orders placed.  Darlene Ortiz was informed that the remainder of the evaluation will be completed by another provider, this initial triage assessment does not replace that evaluation, and the importance of remaining in the ED until their evaluation is complete.  Darlene Ortiz 08/03/22 1837  ADDENDUM --  Advised by staff patient's pregnancy test was positive. Will obtain hcg quant to verify. With patient's lower abdominal cramping, vaginal discharge and some post coital bleeding, contacted [facility] for further eval. MAU APP accepts patient transfer.    Darlene Marcil T, PA-C 08/03/22 2150

## 2022-08-03 NOTE — MAU Provider Note (Signed)
Faculty Practice OB/GYN Attending MAU Note  Chief Complaint: Pelvic Pain   Arrival date and time: 08/03/22 2215   SUBJECTIVE Darlene Ortiz is a 26 y.o. G2P0010 at [redacted]w[redacted]d by LMP who presented to [facility] ER with lower abdominal cramping for two week sand scant bleeding after intercourse two days prior to presentation.  Was treated for BV and yeast after visit at Urgent Care on 07/28/22 and finished treatment, but symptoms have persisted. Reports having white vaginal discharge with odor.  In the ED, she was found to be pregnant, I-stat HCG was 438.4. Stable vital signs.  Given her pregnancy and other symptoms, she was transferred to [facility] MAU for further evaluation. On arrival here, patient reported same symptoms as above. No other concerns.   Past Medical History:  Diagnosis Date   Medical history non-contributory    OB History  Gravida Para Term Preterm AB Living  2       1    SAB IAB Ectopic Multiple Live Births    1          # Outcome Date GA Lbr Len/2nd Weight Sex Delivery Anes PTL Lv  2 Current           1 IAB 04/20/20 [redacted]w[redacted]d          Past Surgical History:  Procedure Laterality Date   NO PAST SURGERIES     Social History   Socioeconomic History   Marital status: Single    Spouse name: Not on file   Number of children: Not on file   Years of education: Not on file   Highest education level: Not on file  Occupational History   Not on file  Tobacco Use   Smoking status: Never   Smokeless tobacco: Never  Vaping Use   Vaping Use: Never used  Substance and Sexual Activity   Alcohol use: Yes    Comment: occassionally   Drug use: Not Currently    Types: Marijuana    Comment: May 2021   Sexual activity: Yes    Birth control/protection: None  Other Topics Concern   Not on file  Social History Narrative   Not on file   Social Determinants of Health   Financial Resource Strain: Not on file  Food Insecurity: No Food Insecurity (09/27/2021)   Hunger Vital Sign    Worried About  Running Out of Food in the Last Year: Never true    Ran Out of Food in the Last Year: Never true  Transportation Needs: No Transportation Needs (09/27/2021)   PRAPARE - Administrator, Civil Service (Medical): No    Lack of Transportation (Non-Medical): No  Physical Activity: Not on file  Stress: Not on file  Social Connections: Not on file  Intimate Partner Violence: Not on file   No current facility-administered medications on file prior to encounter.   Current Outpatient Medications on File Prior to Encounter  Medication Sig Dispense Refill   Fluocinolone Acetonide (DERMOTIC) 0.01 % OIL Place 5 drops in ear(s) 2 (two) times daily as needed (Itching in ears). 20 mL 0   ibuprofen (ADVIL) 200 MG tablet Take 200-400 mg by mouth as needed for moderate pain.     methocarbamol (ROBAXIN) 500 MG tablet Take 1 tablet (500 mg total) by mouth 2 (two) times daily as needed for muscle spasms. (Patient not taking: Reported on 09/27/2021) 20 tablet 0   No Known Allergies  ROS: Pertinent items in HPI  OBJECTIVE BP 135/79   Pulse  87   Temp 98.6 F (37 C)   Resp 16   Ht 5\' 7"  (1.702 m)   Wt 64.4 kg   LMP 06/26/2022 (Exact Date)   SpO2 100%   BMI 22.24 kg/m  CONSTITUTIONAL: Well-developed, well-nourished female in no acute distress.  HENT:  Normocephalic, atraumatic, External right and left ear normal. Oropharynx is clear and moist EYES: Conjunctivae and EOM are normal. Pupils are equal, round, and reactive to light. No scleral icterus.  NECK: Normal range of motion, supple, no masses.  Normal thyroid.  SKIN: Skin is warm and dry. No rash noted. Not diaphoretic. No erythema. No pallor. NEUROLGIC: Alert and oriented to person, place, and time. Normal reflexes, muscle tone coordination. No cranial nerve deficit noted. PSYCHIATRIC: Normal mood and affect. Normal behavior. Normal judgment and thought content. CARDIOVASCULAR: Normal heart rate noted RESPIRATORY: Effort and breath  sounds normal, no problems with respiration noted. ABDOMEN: Soft, normal bowel sounds, no distention noted.  No tenderness, rebound or guarding.  PELVIC: Deferred (self-swab done) MUSCULOSKELETAL: Normal range of motion. No tenderness.  No cyanosis, clubbing, or edema.  2+ distal pulses.  LAB RESULTS Results for orders placed or performed during the hospital encounter of 08/03/22 (from the past 48 hour(s))  Urinalysis, Routine w reflex microscopic     Status: Abnormal   Collection Time: 08/03/22  6:17 PM  Result Value Ref Range   Color, Urine YELLOW YELLOW   APPearance CLOUDY (A) CLEAR   Specific Gravity, Urine 1.020 1.005 - 1.030   pH 6.0 5.0 - 8.0   Glucose, UA NEGATIVE NEGATIVE mg/dL   Hgb urine dipstick NEGATIVE NEGATIVE   Bilirubin Urine NEGATIVE NEGATIVE   Ketones, ur NEGATIVE NEGATIVE mg/dL   Protein, ur NEGATIVE NEGATIVE mg/dL   Nitrite NEGATIVE NEGATIVE   Leukocytes,Ua NEGATIVE NEGATIVE    Comment: Performed at [facility] Lab, [address] [place], [place]  Pregnancy, urine     Status: Abnormal   Collection Time: 08/03/22  6:17 PM  Result Value Ref Range   Preg Test, Ur POSITIVE (A) NEGATIVE    Comment:        THE SENSITIVITY OF THIS METHODOLOGY IS >20 mIU/mL. Performed at [facility], [address], [place], [place]   I-Stat beta hCG blood, ED     Status: Abnormal   Collection Time: 08/03/22  9:51 PM  Result Value Ref Range   I-stat hCG, quantitative 438.4 (H) <5 mIU/mL   Comment 3            Comment:   GEST. AGE      CONC.  (mIU/mL)   <=1 WEEK        5 - 50     2 WEEKS       50 - 500     3 WEEKS       100 - 10,000     4 WEEKS     1,000 - 30,000        FEMALE AND NON-PREGNANT FEMALE:     LESS THAN 5 mIU/mL   Wet prep, genital     Status: Abnormal   Collection Time: 08/03/22 10:27 PM   Specimen: Vaginal  Result Value Ref Range   Yeast Wet Prep HPF POC NONE SEEN NONE SEEN   Trich, Wet Prep NONE SEEN NONE SEEN   Clue Cells Wet  Prep HPF POC PRESENT (A) NONE SEEN   WBC, Wet Prep HPF POC <10 <10   Sperm  NONE SEEN     Comment: Performed at [facility], [address], [place], [place] [address]  CBC     Status: Abnormal   Collection Time: 08/03/22 10:43 PM  Result Value Ref Range   WBC 3.4 (L) 4.0 - 10.5 K/uL   RBC 4.05 3.87 - 5.11 MIL/uL   Hemoglobin 9.5 (L) 12.0 - 15.0 g/dL   HCT 72.5 (L) 36.6 - 44.0 %   MCV 76.0 (L) 80.0 - 100.0 fL   MCH 23.5 (L) 26.0 - 34.0 pg   MCHC 30.8 30.0 - 36.0 g/dL   RDW 34.7 (H) 42.5 - 95.6 %   Platelets 387 150 - 400 K/uL   nRBC 0.0 0.0 - 0.2 %    Comment: Performed at [facility], [address] [place], [place] [address]    IMAGING US OB LESS THAN 14 WEEKS WITH OB TRANSVAGINAL  Result Date: 08/03/2022 CLINICAL DATA:  Pelvic pain with positive pregnancy test EXAM: OBSTETRIC <14 WK [place] AND TRANSVAGINAL OB US TECHNIQUE: Both transabdominal and transvaginal ultrasound examinations were performed for complete evaluation of the gestation as well as the maternal uterus, adnexal regions, and pelvic cul-de-sac. Transvaginal technique was performed to assess early pregnancy. COMPARISON:  None Available. FINDINGS: Intrauterine gestational sac: Absent Maternal uterus/adnexae: Uterus is within normal limits. Ovaries show a corpus luteum cyst on the left. No extra uterine gestational sac is seen. IMPRESSION: Probable early pregnancy although no gestational sac is noted. Recommend follow-up quantitative B-HCG levels and follow-up US in 14-21 days to assess viability. This recommendation follows SRU consensus guidelines: Diagnostic Criteria for Nonviable Pregnancy Early in the First Trimester. Malva Limes Med 2013; 433:2951-88. Electronically Signed   By: Alcide Clever M.D.   On: 08/03/2022 23:14    MAU COURSE Pelvic ultrasound and labs ordered  ASSESSMENT 1. Abdominal pain during pregnancy in first trimester   2. Bacterial vaginitis complicating current pregnancy, first trimester   3.  [redacted] weeks gestation of pregnancy   4. Pregnancy of unknown anatomic location     PLAN Clue cells are still present, can be residual from recent vaginitis episode as she finished treatment less than one week ago.  No worsening symptoms. If symptoms persist or worsen, will retreat. Patient to return here in MAU in 48-72 hours for repeat HCG, will follow trend. Ectopic precautions reviewed.  Prenatal vitamins ordered for now. Discharged to home in stable condition.  Allergies as of 08/03/2022   No Known Allergies      Medication List     STOP taking these medications    ibuprofen 200 MG tablet Commonly known as: ADVIL   methocarbamol 500 MG tablet Commonly known as: ROBAXIN       TAKE these medications    Fluocinolone Acetonide 0.01 % Oil Commonly known as: DermOtic Place 5 drops in ear(s) 2 (two) times daily as needed (Itching in ears).   Prenatal 28-0.8 MG Tabs Take 1 tablet by mouth daily.        Evaluation does not show pathology that would require ongoing emergent intervention or inpatient treatment. Patient is hemodynamically stable and mentating appropriately. Discussed findings and plan with patient, who agrees with care plan. All questions answered. Return precautions discussed and outpatient follow up recommendations given.  Tereso Newcomer, MD 08/03/2022 11:26 PM

## 2022-08-03 NOTE — ED Notes (Signed)
Report given to MAU RN on patient's transfer.  

## 2022-08-03 NOTE — ED Triage Notes (Signed)
Reports lower abd pain that started 2 weeks ago but received treatment for BV and treated flagyl and diflucan.  Patient reports vaginal discharge that is cottage cheese like.  Reports still having symptoms after completing antbiotic regimen.

## 2022-08-04 LAB — GC/CHLAMYDIA PROBE AMP (~~LOC~~) NOT AT ARMC
Chlamydia: NEGATIVE
Comment: NEGATIVE
Comment: NORMAL
Neisseria Gonorrhea: NEGATIVE

## 2022-08-10 ENCOUNTER — Inpatient Hospital Stay (HOSPITAL_COMMUNITY)
Admission: AD | Admit: 2022-08-10 | Discharge: 2022-08-10 | Disposition: A | Discharge status: Home / Self Care | Payer: Medicaid Other | Attending: Obstetrics and Gynecology | Admitting: Obstetrics and Gynecology

## 2022-08-10 DIAGNOSIS — Z3491 Encounter for supervision of normal pregnancy, unspecified, first trimester: Secondary | ICD-10-CM

## 2022-08-10 DIAGNOSIS — Z3A01 Less than 8 weeks gestation of pregnancy: Secondary | ICD-10-CM | POA: Diagnosis not present

## 2022-08-10 DIAGNOSIS — Z3401 Encounter for supervision of normal first pregnancy, first trimester: Secondary | ICD-10-CM | POA: Diagnosis present

## 2022-08-10 DIAGNOSIS — Z3490 Encounter for supervision of normal pregnancy, unspecified, unspecified trimester: Secondary | ICD-10-CM

## 2022-08-10 LAB — CBC
HCT: 29.5 % — ABNORMAL LOW (ref 36.0–46.0)
Hemoglobin: 9.6 g/dL — ABNORMAL LOW (ref 12.0–15.0)
MCH: 24.6 pg — ABNORMAL LOW (ref 26.0–34.0)
MCHC: 32.5 g/dL (ref 30.0–36.0)
MCV: 75.6 fL — ABNORMAL LOW (ref 80.0–100.0)
Platelets: 317 10*3/uL (ref 150–400)
RBC: 3.9 MIL/uL (ref 3.87–5.11)
RDW: 16.9 % — ABNORMAL HIGH (ref 11.5–15.5)
WBC: 4.1 10*3/uL (ref 4.0–10.5)
nRBC: 0 % (ref 0.0–0.2)

## 2022-08-10 LAB — COMPREHENSIVE METABOLIC PANEL
ALT: 11 U/L (ref 0–44)
AST: 17 U/L (ref 15–41)
Albumin: 4.1 g/dL (ref 3.5–5.0)
Alkaline Phosphatase: 37 U/L — ABNORMAL LOW (ref 38–126)
Anion gap: 10 (ref 5–15)
BUN: 5 mg/dL — ABNORMAL LOW (ref 6–20)
CO2: 22 mmol/L (ref 22–32)
Calcium: 9.6 mg/dL (ref 8.9–10.3)
Chloride: 105 mmol/L (ref 98–111)
Creatinine, Ser: 0.68 mg/dL (ref 0.44–1.00)
GFR, Estimated: >60 mL/min (ref >60)
Glucose, Bld: 86 mg/dL (ref 70–99)
Potassium: 3.2 mmol/L — ABNORMAL LOW (ref 3.5–5.1)
Sodium: 137 mmol/L (ref 135–145)
Total Bilirubin: 0.3 mg/dL (ref 0.3–1.2)
Total Protein: 7.2 g/dL (ref 6.5–8.1)

## 2022-08-10 LAB — HCG, QUANTITATIVE, PREGNANCY: hCG, Beta Chain, Quant, S: 10439 m[IU]/mL — ABNORMAL HIGH (ref <5)

## 2022-08-10 NOTE — MAU Provider Note (Signed)
Event Date/Time   First Provider Initiated Contact with Patient 08/10/22 2006      S [name] is a 26 y.o. G2P0010 patient who presents to MAU today without  complaint today.  She states she was suppose to have a repeat hCG in 48 hours after her 8/31 visit, but was unable to get it done.  She reports today with hopes of getting her labs completed.  She denies pain, vaginal bleeding, or discharge.  She does report some pain and discomfort with sex, that is resolved with position change.   O BP 137/73   Pulse 89   Temp 98.1 F (36.7 C)   Resp 16   Ht 5\' 7"  (1.702 m)   Wt 63 kg   LMP 06/26/2022 (Exact Date)   SpO2 100%   BMI 21.77 kg/m  Physical Exam Constitutional:      Appearance: Normal appearance.  HENT:     Head: Normocephalic and atraumatic.  Eyes:     Conjunctiva/sclera: Conjunctivae normal.  Cardiovascular:     Rate and Rhythm: Normal rate.  Pulmonary:     Effort: Pulmonary effort is normal. No respiratory distress.  Musculoskeletal:     Cervical back: Normal range of motion.  Neurological:     Mental Status: She is alert and oriented to person, place, and time.  Psychiatric:        Mood and Affect: Mood normal.     A Medical screening exam complete Repeat Labs  P -Labs ordered. -Informed that will add on CBC and CMP in case abnormal rise suggestive of ectopic. -Discussed repeat hCG and informed that number will determine next steps including return for treatment, repeat hCG, or scheduling of ultrasound. -Patient reports that she is able to return, tonight, if necessary. -Confirms home phone number as per demographics. -Warning signs for worsening condition that would warrant emergency follow-up discussed. -Discharge from MAU in stable condition  06/28/2022, CNM 08/10/2022 8:08 PM

## 2022-08-10 NOTE — MAU Note (Signed)
.  Darlene Ortiz is a 26 y.o. at [redacted]w[redacted]d here in MAU for repeat BHCG. Denies any pain or vag bleeding.  LMP: 06/26/22 Onset of complaint: n/a Pain score: 0 Vitals:   08/10/22 1951 08/10/22 1953  BP:  137/73  Pulse:  89  Resp: 16   Temp: 98.1 F (36.7 C)   SpO2:  100%     FHT:n/a Lab orders placed from triage:

## 2022-08-10 NOTE — Progress Notes (Signed)
Donette Larry CNM in Triage to see pt and discuss plan of care. Pt's lab drawn and pt may leave. Provider will call pt with results. Pt agrees with plan of care

## 2022-08-11 ENCOUNTER — Other Ambulatory Visit: Payer: Self-pay

## 2022-08-11 DIAGNOSIS — O3680X0 Pregnancy with inconclusive fetal viability, not applicable or unspecified: Secondary | ICD-10-CM

## 2022-08-24 ENCOUNTER — Other Ambulatory Visit: Payer: Self-pay

## 2022-08-24 ENCOUNTER — Ambulatory Visit
Admission: RE | Admit: 2022-08-24 | Discharge: 2022-08-24 | Disposition: A | Discharge status: Home / Self Care | Payer: Medicaid Other | Source: Ambulatory Visit

## 2022-08-24 DIAGNOSIS — O3680X0 Pregnancy with inconclusive fetal viability, not applicable or unspecified: Secondary | ICD-10-CM | POA: Diagnosis present

## 2022-08-24 DIAGNOSIS — Z8759 Personal history of other complications of pregnancy, childbirth and the puerperium: Secondary | ICD-10-CM | POA: Insufficient documentation

## 2022-09-09 ENCOUNTER — Inpatient Hospital Stay (HOSPITAL_COMMUNITY)
Admission: AD | Admit: 2022-09-09 | Discharge: 2022-09-09 | Disposition: A | Discharge status: Home / Self Care | Payer: Medicaid Other | Attending: Obstetrics and Gynecology | Admitting: Obstetrics and Gynecology

## 2022-09-09 ENCOUNTER — Encounter (HOSPITAL_COMMUNITY): Payer: Self-pay | Admitting: Obstetrics and Gynecology

## 2022-09-09 DIAGNOSIS — N898 Other specified noninflammatory disorders of vagina: Secondary | ICD-10-CM

## 2022-09-09 DIAGNOSIS — R102 Pelvic and perineal pain: Secondary | ICD-10-CM | POA: Diagnosis present

## 2022-09-09 DIAGNOSIS — O26891 Other specified pregnancy related conditions, first trimester: Secondary | ICD-10-CM

## 2022-09-09 DIAGNOSIS — Z3A10 10 weeks gestation of pregnancy: Secondary | ICD-10-CM

## 2022-09-09 LAB — URINALYSIS, ROUTINE W REFLEX MICROSCOPIC
Bilirubin Urine: NEGATIVE
Glucose, UA: NEGATIVE mg/dL
Hgb urine dipstick: NEGATIVE
Ketones, ur: NEGATIVE mg/dL
Nitrite: NEGATIVE
Protein, ur: NEGATIVE mg/dL
Specific Gravity, Urine: 1.015 (ref 1.005–1.030)
pH: 7 (ref 5.0–8.0)

## 2022-09-09 LAB — WET PREP, GENITAL
Clue Cells Wet Prep HPF POC: NONE SEEN
Sperm: NONE SEEN
Trich, Wet Prep: NONE SEEN
WBC, Wet Prep HPF POC: >=10 — AB
Yeast Wet Prep HPF POC: NONE SEEN

## 2022-09-09 NOTE — MAU Note (Signed)
.  Darlene Ortiz is a 26 y.o. at [redacted]w[redacted]d here in MAU reporting: of vaginal discomfort/itching/swelling reports a white discharge as well. C/o n/v  for a few days too LMP:  Onset of complaint: 2-3 days Pain score: 4 irritation/itching  There were no vitals filed for this visit.   MBE:675/44 Lab orders placed from triage:

## 2022-09-09 NOTE — MAU Provider Note (Signed)
History     CSN: [id number]  Arrival date and time: 09/09/22 1105   Event Date/Time   First Provider Initiated Contact with Patient 09/09/22 1150      Chief Complaint  Patient presents with   Vaginal Pain   HPI  [name] is a 26 y.o. female G2P0010 @ [redacted]w[redacted]d here with complaints of vaginal discharge, burning and itching. Symptoms started 3-4 days ago. She has not tried anything over the counter. She has no bleeding. Plans to start care with the [facility] health department.   OB History     Gravida  2   Para      Term      Preterm      AB  1   Living         SAB      IAB  1   Ectopic      Multiple      Live Births              Past Medical History:  Diagnosis Date   Medical history non-contributory     Past Surgical History:  Procedure Laterality Date   NO PAST SURGERIES      Family History  Problem Relation Age of Onset   Diabetes Mother    Breast cancer Maternal Grandmother     Social History   Tobacco Use   Smoking status: Never   Smokeless tobacco: Never  Vaping Use   Vaping Use: Never used  Substance Use Topics   Alcohol use: Not Currently    Comment: occassionally   Drug use: Not Currently    Types: Marijuana    Comment: May 2021    Allergies: No Known Allergies  Medications Prior to Admission  Medication Sig Dispense Refill Last Dose   Prenatal 28-0.8 MG TABS Take 1 tablet by mouth daily. 30 tablet 12 Past Week   Fluocinolone Acetonide (DERMOTIC) 0.01 % OIL Place 5 drops in ear(s) 2 (two) times daily as needed (Itching in ears). 20 mL 0    Results for orders placed or performed during the hospital encounter of 09/09/22 (from the past 48 hour(s))  Urinalysis, Routine w reflex microscopic Urine, Clean Catch     Status: Abnormal   Collection Time: 09/09/22 11:27 AM  Result Value Ref Range   Color, Urine YELLOW YELLOW   APPearance HAZY (A) CLEAR   Specific Gravity, Urine 1.015 1.005 - 1.030   pH 7.0 5.0 - 8.0   Glucose,  UA NEGATIVE NEGATIVE mg/dL   Hgb urine dipstick NEGATIVE NEGATIVE   Bilirubin Urine NEGATIVE NEGATIVE   Ketones, ur NEGATIVE NEGATIVE mg/dL   Protein, ur NEGATIVE NEGATIVE mg/dL   Nitrite NEGATIVE NEGATIVE   Leukocytes,Ua MODERATE (A) NEGATIVE   RBC / HPF 0-5 0 - 5 RBC/hpf   WBC, UA 0-5 0 - 5 WBC/hpf   Bacteria, UA RARE (A) NONE SEEN   Squamous Epithelial / LPF 6-10 0 - 5   Mucus PRESENT     Comment: Performed at [facility], [address] [place], [place] [address]  Wet prep, genital     Status: Abnormal   Collection Time: 09/09/22 11:54 AM   Specimen: PATH Cytology Cervicovaginal Ancillary Only  Result Value Ref Range   Yeast Wet Prep HPF POC NONE SEEN NONE SEEN   Trich, Wet Prep NONE SEEN NONE SEEN   Clue Cells Wet Prep HPF POC NONE SEEN NONE SEEN   WBC, Wet Prep HPF POC >=10 (A) <10  Sperm NONE SEEN     Comment: Performed at [facility], [place] [address], [place], [place] [address]    Review of Systems  Constitutional:  Negative for fever.  Gastrointestinal:  Negative for abdominal pain.  Genitourinary:  Positive for vaginal discharge.   Physical Exam   Blood pressure 123/74, pulse 81, temperature 97.7 F (36.5 C), resp. rate 18, height 5\' 7"  (1.702 m), weight 63 kg, last menstrual period 06/26/2022.  Physical Exam Vitals and nursing note reviewed.  Constitutional:      General: She is not in acute distress.    Appearance: Normal appearance. She is not ill-appearing, toxic-appearing or diaphoretic.  HENT:     Head: Normocephalic.  Genitourinary:    Comments: Wet prep and GC collected without speculum. Small amount of white vaginal discharge noted on perineum    Musculoskeletal:        General: Normal range of motion.  Neurological:     Mental Status: She is alert and oriented to person, place, and time.  Psychiatric:        Behavior: Behavior normal.     MAU Course  Procedures  MDM  Wet prep and GC collected Urine culture pending.    Assessment and Plan   A:  1. Vaginal discharge in pregnancy in first trimester   2. [redacted] weeks gestation of pregnancy      P:  Dc home. Urine culture pending GC pending Return to MAU for OB emergencies  Ruthanna Macchia, Artist Pais, NP 09/09/2022 12:30 PM

## 2022-09-10 ENCOUNTER — Ambulatory Visit (HOSPITAL_COMMUNITY): Payer: Medicaid Other

## 2022-09-10 LAB — CULTURE, OB URINE: Culture: NO GROWTH

## 2022-09-11 LAB — GC/CHLAMYDIA PROBE AMP (~~LOC~~) NOT AT ARMC
Chlamydia: NEGATIVE
Comment: NEGATIVE
Comment: NORMAL
Neisseria Gonorrhea: NEGATIVE

## 2022-10-05 ENCOUNTER — Telehealth: Payer: Medicaid Other

## 2022-10-12 ENCOUNTER — Ambulatory Visit: Payer: Self-pay

## 2022-10-24 ENCOUNTER — Telehealth (INDEPENDENT_AMBULATORY_CARE_PROVIDER_SITE_OTHER): Payer: Medicaid Other

## 2022-10-24 DIAGNOSIS — Z3689 Encounter for other specified antenatal screening: Secondary | ICD-10-CM

## 2022-10-24 DIAGNOSIS — Z3480 Encounter for supervision of other normal pregnancy, unspecified trimester: Secondary | ICD-10-CM

## 2022-10-24 MED ORDER — BLOOD PRESSURE MONITORING DEVI: 1.0000 | 0 refills | Status: DC

## 2022-10-24 MED ORDER — GOJJI WEIGHT SCALE MISC: 1.0000 | 0 refills | Status: DC

## 2022-10-24 NOTE — Progress Notes (Signed)
New OB Intake  I connected withNAME@ on 10/24/22 at  1:15 PM EST by MyChart Video Visit and verified that I am speaking with the correct person using two identifiers. Nurse is located at [facility] and pt is located at [facility].  I discussed the limitations, risks, security and privacy concerns of performing an evaluation and management service by telephone and the availability of in person appointments. I also discussed with the patient that there may be a patient responsible charge related to this service. The patient expressed understanding and agreed to proceed.  I explained I am completing New OB Intake today. We discussed EDD of 04/02/23 that is based on LMP of 06/26/22. Pt is G2/P0. I reviewed her allergies, medications, Medical/Surgical/OB history, and appropriate screenings. I informed her of [facility] services. [facility] information placed in AVS. Based on history, this is a low risk pregnancy.  There are no problems to display for this patient.   Concerns addressed today  Delivery Plans Plans to deliver at [facility]. Patient given information for [facility] Healthy Baby website for more information about [facility]. Patient is not interested in water birth. Offered upcoming OB visit with CNM to discuss further.  MyChart/Babyscripts MyChart access verified. I explained pt will have some visits in office and some virtually. Babyscripts instructions given and order placed. Patient verifies receipt of registration text/e-mail. Account successfully created and app downloaded.  Blood Pressure Cuff/Weight Scale Blood pressure cuff ordered for patient to pick-up from [employer]. Explained after first prenatal appt pt will check weekly and document in Babyscripts. Patient does not have weight scale; order sent to [facility], patient may track weight weekly in Babyscripts.  Anatomy US Explained first scheduled [place] will be around 19 weeks. Anatomy US scheduled for 11/24/22 at 0130p. Pt  notified to arrive at 0115p.  Labs Discussed Avelina Laine genetic screening with patient. Would like both Panorama and Horizon drawn at new OB visit. Routine prenatal labs needed.  COVID Vaccine Patient has had COVID vaccine.   Is patient a CenteringPregnancy candidate?  Declined Declined due to Enrolled in [facility] Not a candidate due to  NA If accepted,    Is patient a Mom+Baby Combined Care candidate?  Accepted   If accepted, Mom+Baby staff notified  Social Determinants of Health Food Insecurity: Patient denies food insecurity. WIC Referral: Patient is interested in referral to [facility].  Transportation: Patient denies transportation needs. Childcare: Discussed no children allowed at ultrasound appointments. Offered childcare services; patient declines childcare services at this time.  First visit review I reviewed new OB appt with patient. I explained they will have a provider visit that includes . Explained pt will be seen by Dr.Newton at first visit; encounter routed to appropriate provider. Explained that patient will be seen by pregnancy navigator following visit with provider.   Henrietta Dine, CMA 10/24/2022  1:20 PM

## 2022-10-30 ENCOUNTER — Other Ambulatory Visit (INDEPENDENT_AMBULATORY_CARE_PROVIDER_SITE_OTHER): Payer: Self-pay | Admitting: Hematology and Oncology

## 2022-10-30 DIAGNOSIS — Z124 Encounter for screening for malignant neoplasm of cervix: Secondary | ICD-10-CM

## 2022-10-30 NOTE — Progress Notes (Signed)
Patient: Darlene Ortiz           Date of Birth: [birth year]           MRN: [id number] Visit Date: 10/30/2022 PCP: Pcp, No  Cervical Cancer Screening Do you smoke?: No Have you ever had or been told you have an allergy to latex products?: No Marital status: Married Date of last pap smear: 1-2 yrs ago Number of pregnancies: 2 Number of births: 0 Have you ever had any of the following? Hysterectomy: No Tubal ligation (tubes tied): No Abnormal bleeding: No Abnormal pap smear: No Venereal warts: No A sex partner with venereal warts: No A high risk* sex partner: No  Cervical Exam  Abnormal Observations: Normal exam. 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+      Patient's History Patient Active Problem List   Diagnosis Date Noted   Supervision of other normal pregnancy, antepartum 10/24/2022   Past Medical History:  Diagnosis Date   Medical history non-contributory     Family History  Problem Relation Age of Onset   Diabetes Mother    Breast cancer Maternal Grandmother     Social History   Occupational History   Not on file  Tobacco Use   Smoking status: Never   Smokeless tobacco: Never  Vaping Use   Vaping Use: Never used  Substance and Sexual Activity   Alcohol use: Not Currently    Comment: occassionally   Drug use: Not Currently    Types: Marijuana    Comment: May 2021   Sexual activity: Yes    Birth control/protection: None

## 2022-10-31 ENCOUNTER — Encounter: Payer: Self-pay | Admitting: Family Medicine

## 2022-10-31 ENCOUNTER — Other Ambulatory Visit: Payer: Self-pay | Admitting: Hematology and Oncology

## 2022-10-31 ENCOUNTER — Telehealth: Payer: Self-pay

## 2022-10-31 LAB — CERVICOVAGINAL ANCILLARY ONLY
Bacterial Vaginitis (gardnerella): POSITIVE — AB
Candida Glabrata: NEGATIVE
Candida Vaginitis: NEGATIVE
Comment: NEGATIVE
Comment: NEGATIVE
Comment: NEGATIVE
Comment: NEGATIVE
Trichomonas: NEGATIVE

## 2022-10-31 MED ORDER — METRONIDAZOLE 500 MG PO TABS: 500.0000 mg | ORAL_TABLET | Freq: Two times a day (BID) | ORAL | 0 refills | Status: DC

## 2022-10-31 NOTE — Telephone Encounter (Signed)
-----   Message from Pascal Lux, NP sent at 10/31/2022 11:04 AM EST ----- I sent prescription  for Flagyl for this patient with BV on pap. Can you let her know?  Thank you, Melissa ----- Message ----- From: Interface, Lab In [facility] One Sent: 10/31/2022  11:01 AM EST To: Pascal Lux, NP

## 2022-10-31 NOTE — Telephone Encounter (Signed)
Pt has been advised of her results and rx has been sent to her pharmacy. Pt requests that she instead be prescribed Metronidazole cream instead of the oral medication given that she is pregnant. Pt was advised I will let the provider know and if the rx can be changed, we will resend it to her confirmed pharmacy on file. Pt expressed understanding of this information.

## 2022-11-01 ENCOUNTER — Other Ambulatory Visit (HOSPITAL_COMMUNITY)
Admission: RE | Admit: 2022-11-01 | Discharge: 2022-11-01 | Disposition: A | Discharge status: Home / Self Care | Payer: Medicaid Other | Source: Ambulatory Visit | Attending: Family Medicine | Admitting: Family Medicine

## 2022-11-01 ENCOUNTER — Other Ambulatory Visit: Payer: Self-pay | Admitting: Hematology and Oncology

## 2022-11-01 ENCOUNTER — Ambulatory Visit (INDEPENDENT_AMBULATORY_CARE_PROVIDER_SITE_OTHER): Payer: Medicaid Other | Admitting: Family Medicine

## 2022-11-01 ENCOUNTER — Encounter: Payer: Self-pay | Admitting: Family Medicine

## 2022-11-01 VITALS — BP 130/79 | HR 101 | Wt 140.0 lb

## 2022-11-01 DIAGNOSIS — Z3480 Encounter for supervision of other normal pregnancy, unspecified trimester: Secondary | ICD-10-CM | POA: Insufficient documentation

## 2022-11-01 MED ORDER — CLINDAMYCIN PHOSPHATE 2 % VA CREA: 1.0000 | TOPICAL_CREAM | Freq: Every day | VAGINAL | 0 refills | Status: DC

## 2022-11-01 MED ORDER — ASPIRIN 81 MG PO TBEC: 81.0000 mg | DELAYED_RELEASE_TABLET | Freq: Every day | ORAL | 2 refills | Status: DC

## 2022-11-01 NOTE — Progress Notes (Signed)
INITIAL PRENATAL VISIT  Subjective:   Darlene Ortiz is being seen today for her first obstetrical visit.  This is a planned pregnancy. This is a desired pregnancy.  She is at [redacted]w[redacted]d gestation by LMP Her obstetrical history is significant for  no medical history . Relationship with FOB:  Involved . Patient does intend to breast feed. Pregnancy history fully reviewed.  Patient reports no complaints.  Indications for ASA therapy (per uptodate) One of the following: Previous pregnancy with preeclampsia, especially early onset and with an adverse outcome No Multifetal gestation No Chronic hypertension No Type 1 or 2 diabetes mellitus No Chronic kidney disease No Autoimmune disease (antiphospholipid syndrome, systemic lupus erythematosus) No  Two or more of the following: Nulliparity Yes Obesity (body mass index >30 kg/m2) No Family history of preeclampsia in mother or sister No Age ?35 years No Sociodemographic characteristics (African American race, low socioeconomic level) Yes Personal risk factors (eg, previous pregnancy with low birth weight or small for gestational age infant, previous adverse pregnancy outcome [eg, stillbirth], interval >10 years between pregnancies) No  Indications for early GDM screening  First-degree relative with diabetes Yes BMI >30kg/m2 No Age > 25 Yes Previous birth of an infant weighing ?4000 g No Gestational diabetes mellitus in a previous pregnancy No Glycated hemoglobin ?5.7 percent (39 mmol/mol), impaired glucose tolerance, or impaired fasting glucose on previous testing No High-risk race/ethnicity (eg, African American, Latino, Native American, Asian American, Pacific Islander) Yes Previous stillbirth of unknown cause No Maternal birthweight > 9 lbs No History of cardiovascular disease No Hypertension or on therapy for hypertension No High-density lipoprotein cholesterol level <35 mg/dL (0.90 mmol/L) and/or a triglyceride level >250 mg/dL (2.82  mmol/L) No Polycystic ovary syndrome No Physical inactivity No Other clinical condition associated with insulin resistance (eg, severe obesity, acanthosis nigricans) No Current use of glucocorticoids No   Early screening tests: FBS, A1C, Random CBG, glucose challenge   Review of Systems:   Review of Systems  Objective:    Obstetric History OB History  Gravida Para Term Preterm AB Living  2       1    SAB IAB Ectopic Multiple Live Births    1          # Outcome Date GA Lbr Len/2nd Weight Sex Delivery Anes PTL Lv  2 Current           1 IAB 04/20/20 [redacted]w[redacted]d           Past Medical History:  Diagnosis Date   Medical history non-contributory     Past Surgical History:  Procedure Laterality Date   NO PAST SURGERIES     TOOTH EXTRACTION      Current Outpatient Medications on File Prior to Visit  Medication Sig Dispense Refill   Prenatal 28-0.8 MG TABS Take 1 tablet by mouth daily. 30 tablet 12   Blood Pressure Monitoring DEVI 1 each by Does not apply route once a week. 1 each 0   Fluocinolone Acetonide (DERMOTIC) 0.01 % OIL Place 5 drops in ear(s) 2 (two) times daily as needed (Itching in ears). 20 mL 0   metroNIDAZOLE (FLAGYL) 500 MG tablet Take 1 tablet (500 mg total) by mouth 2 (two) times daily. (Patient not taking: Reported on 11/01/2022) 14 tablet 0   Misc. Devices (GOJJI WEIGHT SCALE) MISC 1 each by Does not apply route once a week. 1 each 0   No current facility-administered medications on file prior to visit.    No  Known Allergies  Social History:  reports that she has never smoked. She has never used smokeless tobacco. She reports that she does not currently use alcohol. She reports that she does not currently use drugs after having used the following drugs: Marijuana.  Family History  Problem Relation Age of Onset   Diabetes Mother    Breast cancer Maternal Grandmother     The following portions of the patient's history were reviewed and updated as  appropriate: allergies, current medications, past family history, past medical history, past social history, past surgical history and problem list.  Review of Systems Review of Systems  Constitutional:  Negative for chills and fever.  HENT:  Negative for congestion and sore throat.   Eyes:  Negative for pain and visual disturbance.  Respiratory:  Negative for cough, chest tightness and shortness of breath.   Cardiovascular:  Negative for chest pain.  Gastrointestinal:  Negative for abdominal pain, diarrhea, nausea and vomiting.  Endocrine: Negative for cold intolerance and heat intolerance.  Genitourinary:  Negative for dysuria and flank pain.  Musculoskeletal:  Negative for back pain.  Skin:  Negative for rash.  Allergic/Immunologic: Negative for food allergies.  Neurological:  Negative for dizziness and light-headedness.  Psychiatric/Behavioral:  Negative for agitation.       Physical Exam:  BP 130/79   Pulse (!) 101   Wt 140 lb (63.5 kg)   LMP 06/26/2022 (Exact Date)   BMI 21.93 kg/m  CONSTITUTIONAL: Well-developed, well-nourished female in no acute distress.  HENT:  Normocephalic, atraumatic, External right and left ear normal. Oropharynx is clear and moist EYES: Conjunctivae normal. No scleral icterus.  NECK: Normal range of motion, supple, no masses.  Normal thyroid.  SKIN: Skin is warm and dry. No rash noted. Not diaphoretic. No erythema. No pallor. MUSCULOSKELETAL: Normal range of motion. No tenderness.  No cyanosis, clubbing, or edema.   NEUROLOGIC: Alert and oriented to person, place, and time. Normal muscle tone coordination.  PSYCHIATRIC: Normal mood and affect. Normal behavior. Normal judgment and thought content. CARDIOVASCULAR: Normal heart rate noted, regular rhythm RESPIRATORY: Clear to auscultation bilaterally. Effort and breath sounds normal, no problems with respiration noted. BREASTS: Symmetric in size. No masses, skin changes, nipple drainage, or  lymphadenopathy. ABDOMEN: Soft, normal bowel sounds, no distention noted.  No tenderness, rebound or guarding. Fundal ht: 18 PELVIC: not indicated FHR: 145   Assessment:    Pregnancy: G2P0010  1. Supervision of other normal pregnancy, antepartum - Culture, OB Urine - CBC/D/Plt+RPR+Rh+ABO+RubIgG... - Hemoglobin A1c - GC/Chlamydia probe amp ([place])not at [facility] - Panorama Prenatal Test Full Panel - HORIZON CUSTOM - aspirin EC 81 MG tablet; Take 1 tablet (81 mg total) by mouth daily. Take after 12 weeks for prevention of preeclampsia later in pregnancy  Dispense: 300 tablet; Refill: 2  2. BV- given rx for metronidazole by [facility]. Counseled about safety in pregnancy    Plan:     Initial labs drawn. Prenatal vitamins. Problem list reviewed and updated. Reviewed in detail the nature of the practice with collaborative care between  Genetic screening discussed: NIPS/  ordered. Role of ultrasound in pregnancy discussed; Anatomy US: ordered. Amniocentesis discussed: not indicated. Follow up in 4 weeks. Discussed clinic routines, schedule of care and testing, genetic screening options, involvement of students and residents under the direct supervision of APPs and doctors and presence of female providers. Pt verbalized understanding.  Future Appointments  Date Time Provider Department Center  11/24/2022 11:15 AM Venora Maples, MD [facility]  11/24/2022  1:30 PM [facility] US3 [facility]  12/22/2022  9:15 AM Dione Plover, Annice Needy, MD [facility]    Caren Macadam, MD 11/01/2022 1:45 PM

## 2022-11-01 NOTE — Patient Instructions (Signed)
Aspirin 81 mg po daily ordered for preeclampsia prevention        Considering Waterbirth? Guide for patients at [facility] ([facility]) Why consider waterbirth? Gentle birth for babies  Less pain medicine used in labor  May allow for passive descent/less pushing  May reduce perineal tears  More mobility and instinctive maternal position changes  Increased maternal relaxation   Is waterbirth safe? What are the risks of infection, drowning or other complications? Infection:  Very low risk (3.7 % for tub vs 4.8% for bed)  7 in 8000 waterbirths with documented infection  Poorly cleaned equipment most common cause  Slightly lower group B strep transmission rate  Drowning  Maternal:  Very low risk  Related to seizures or fainting  Newborn:  Very low risk. No evidence of increased risk of respiratory problems in multiple large studies  Physiological protection from breathing under water  Avoid underwater birth if there are any fetal complications  Once baby's head is out of the water, keep it out.  Birth complication  Some reports of cord trauma, but risk decreased by bringing baby to surface gradually  No evidence of increased risk of shoulder dystocia. Mothers can usually change positions faster in water than in a bed, possibly aiding the maneuvers to free the shoulder.   There are 2 things you MUST do to have a waterbirth with [facility]: Attend a waterbirth class at [facility] [place]   3rd Wednesday of every month from 7-9 pm (virtual during COVID) Free Register online at [email or web address] or [email or web address] or by calling [phone] Bring us the certificate from the class to your prenatal appointment or send via MyChart Meet with a midwife at 36 weeks* to see if you can still plan a waterbirth and to sign the consent.   *We also recommend that you schedule as many of your prenatal visits with a midwife as possible.    Helpful  information: You may want to bring a bathing suit top to the hospital to wear during labor but this is optional.  All other supplies are provided by the hospital. Please arrive at the hospital with signs of active labor, and do not wait at home until late in labor. It takes 45 min- 1 hour for fetal monitoring, and check in to your room to take place, plus transport and filling of the waterbirth tub.    Things that would prevent you from having a waterbirth: Premature, <37wks  Previous cesarean birth  Presence of thick meconium-stained fluid  Multiple gestation (Twins, triplets, etc.)  Uncontrolled diabetes or gestational diabetes requiring medication  Hypertension diagnosed in pregnancy or preexisting hypertension (gestational hypertension, preeclampsia, or chronic hypertension) Fetal growth restriction (your baby measures less than 10th percentile on ultrasound) Heavy vaginal bleeding  Non-reassuring fetal heart rate  Active infection (MRSA, etc.). Group B Strep is NOT a contraindication for waterbirth.  If your labor has to be induced and induction method requires continuous monitoring of the baby's heart rate  Other risks/issues identified by your obstetrical provider   Please remember that birth is unpredictable. Under certain unforeseeable circumstances your provider may advise against giving birth in the tub. These decisions will be made on a case-by-case basis and with the safety of you and your baby as our highest priority.    Updated 03/08/22  

## 2022-11-02 LAB — GC/CHLAMYDIA PROBE AMP (~~LOC~~) NOT AT ARMC
Chlamydia: NEGATIVE
Comment: NEGATIVE
Comment: NORMAL
Neisseria Gonorrhea: NEGATIVE

## 2022-11-02 LAB — CYTOLOGY - PAP
Comment: NEGATIVE
Diagnosis: UNDETERMINED — AB
High risk HPV: POSITIVE — AB

## 2022-11-03 DIAGNOSIS — A539 Syphilis, unspecified: Secondary | ICD-10-CM

## 2022-11-03 HISTORY — DX: Syphilis, unspecified: A53.9

## 2022-11-03 LAB — AB SCR+ANTIBODY ID

## 2022-11-03 LAB — RPR, QUANT+TP ABS (REFLEX)
Rapid Plasma Reagin, Quant: 1:4 {titer} — ABNORMAL HIGH
T Pallidum Abs: REACTIVE — AB

## 2022-11-03 LAB — CBC/D/PLT+RPR+RH+ABO+RUBIGG...
Basophils Absolute: 0 10*3/uL (ref 0.0–0.2)
Basos: 1 %
EOS (ABSOLUTE): 0.1 10*3/uL (ref 0.0–0.4)
Eos: 2 %
HCV Ab: NONREACTIVE
HIV Screen 4th Generation wRfx: NONREACTIVE
Hematocrit: 32.9 % — ABNORMAL LOW (ref 34.0–46.6)
Hemoglobin: 10.7 g/dL — ABNORMAL LOW (ref 11.1–15.9)
Hepatitis B Surface Ag: NEGATIVE
Immature Grans (Abs): 0 10*3/uL (ref 0.0–0.1)
Immature Granulocytes: 0 %
Lymphocytes Absolute: 1.1 10*3/uL (ref 0.7–3.1)
Lymphs: 27 %
MCH: 27.3 pg (ref 26.6–33.0)
MCHC: 32.5 g/dL (ref 31.5–35.7)
MCV: 84 fL (ref 79–97)
Monocytes Absolute: 0.4 10*3/uL (ref 0.1–0.9)
Monocytes: 10 %
Neutrophils Absolute: 2.4 10*3/uL (ref 1.4–7.0)
Neutrophils: 60 %
Platelets: 365 10*3/uL (ref 150–450)
RBC: 3.92 x10E6/uL (ref 3.77–5.28)
RDW: 16.4 % — ABNORMAL HIGH (ref 11.7–15.4)
RPR Ser Ql: REACTIVE — AB
Rh Factor: POSITIVE
Rubella Antibodies, IGG: 3.24 index (ref >0.99)
WBC: 4 10*3/uL (ref 3.4–10.8)

## 2022-11-03 LAB — URINE CULTURE, OB REFLEX

## 2022-11-03 LAB — HEMOGLOBIN A1C
Est. average glucose Bld gHb Est-mCnc: 97 mg/dL
Hgb A1c MFr Bld: 5 % (ref 4.8–5.6)

## 2022-11-03 LAB — HCV INTERPRETATION

## 2022-11-03 LAB — CULTURE, OB URINE

## 2022-11-03 NOTE — Telephone Encounter (Signed)
I have spoken with the pt again and advised of the rx cream being sent. Pt states she started the oral Flagyl and will continue that rather than switching to the cream.

## 2022-11-06 ENCOUNTER — Encounter: Payer: Self-pay | Admitting: Family Medicine

## 2022-11-06 ENCOUNTER — Emergency Department (HOSPITAL_COMMUNITY): Admission: EM | Admit: 2022-11-06 | Discharge: 2022-11-06 | Discharge status: Against Medical Advice | Payer: MEDICAID

## 2022-11-06 DIAGNOSIS — O98119 Syphilis complicating pregnancy, unspecified trimester: Secondary | ICD-10-CM | POA: Insufficient documentation

## 2022-11-06 NOTE — ED Notes (Signed)
Pt called multiple times for triage, no anwer

## 2022-11-07 ENCOUNTER — Encounter: Payer: Self-pay | Admitting: Family Medicine

## 2022-11-07 ENCOUNTER — Telehealth: Payer: Self-pay | Admitting: General Practice

## 2022-11-07 ENCOUNTER — Telehealth: Payer: Medicaid Other

## 2022-11-07 NOTE — Telephone Encounter (Signed)
Called patient at listed number but message states this number is not service. Called patient at emergency contact number who states she will give the patient the message to call us back.

## 2022-11-07 NOTE — Telephone Encounter (Signed)
Pt returned phone call regarding results from RPR. She was aware she had tested positive for syphilis. Scheduled for nurse visit for first round of treatment. Appointment made for 12/8 a@1020 . Pt aware.

## 2022-11-07 NOTE — Telephone Encounter (Signed)
-----   Message from Federico Flake, MD sent at 11/06/2022  3:38 PM EST ----- Needs late latent treatment- PCN weekly x 3. Called [facility] and confirmed patient has not been tested or treated in the past.

## 2022-11-10 ENCOUNTER — Ambulatory Visit (INDEPENDENT_AMBULATORY_CARE_PROVIDER_SITE_OTHER): Payer: Medicaid Other | Admitting: *Deleted

## 2022-11-10 VITALS — BP 126/75 | HR 77 | Ht 68.0 in | Wt 145.8 lb

## 2022-11-10 DIAGNOSIS — O98112 Syphilis complicating pregnancy, second trimester: Secondary | ICD-10-CM

## 2022-11-10 DIAGNOSIS — Z3A19 19 weeks gestation of pregnancy: Secondary | ICD-10-CM | POA: Diagnosis not present

## 2022-11-10 DIAGNOSIS — O98119 Syphilis complicating pregnancy, unspecified trimester: Secondary | ICD-10-CM

## 2022-11-10 MED ORDER — PENICILLIN G BENZATHINE 1200000 UNIT/2ML IM SUSY
2.4000 10*6.[IU] | PREFILLED_SYRINGE | Freq: Once | INTRAMUSCULAR | Status: AC
  Administered 2022-11-10: 2.4 10*6.[IU] via INTRAMUSCULAR
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
## 2022-11-14 LAB — PANORAMA PRENATAL TEST FULL PANEL:PANORAMA TEST PLUS 5 ADDITIONAL MICRODELETIONS: FETAL FRACTION: 5.4

## 2022-11-14 LAB — HORIZON CUSTOM: REPORT SUMMARY: NEGATIVE

## 2022-11-17 ENCOUNTER — Ambulatory Visit (INDEPENDENT_AMBULATORY_CARE_PROVIDER_SITE_OTHER): Payer: Medicaid Other | Admitting: *Deleted

## 2022-11-17 VITALS — BP 114/71 | HR 75 | Ht 68.0 in | Wt 144.7 lb

## 2022-11-17 DIAGNOSIS — O98119 Syphilis complicating pregnancy, unspecified trimester: Secondary | ICD-10-CM

## 2022-11-17 DIAGNOSIS — Z3A20 20 weeks gestation of pregnancy: Secondary | ICD-10-CM | POA: Diagnosis not present

## 2022-11-17 MED ORDER — PENICILLIN G BENZATHINE 1200000 UNIT/2ML IM SUSY
2.4000 10*6.[IU] | PREFILLED_SYRINGE | Freq: Once | INTRAMUSCULAR | Status: AC
  Administered 2022-11-17: 2.4 10*6.[IU] via INTRAMUSCULAR

## 2022-11-17 NOTE — Progress Notes (Signed)
Pt presents for 2nd dose of Bicillin which was administered without difficulty.  She reports no problems or c/o since last visit. I asked pt if she desired [facility] appt in office as previously discussed last week and she declined. She will return in one week for 3rd dose.

## 2022-11-20 ENCOUNTER — Encounter: Payer: Self-pay | Admitting: *Deleted

## 2022-11-24 ENCOUNTER — Encounter: Payer: Self-pay | Admitting: Obstetrics and Gynecology

## 2022-11-24 ENCOUNTER — Other Ambulatory Visit: Payer: Self-pay | Admitting: Family Medicine

## 2022-11-24 ENCOUNTER — Ambulatory Visit: Payer: Medicaid Other | Attending: Family Medicine

## 2022-11-24 ENCOUNTER — Ambulatory Visit (INDEPENDENT_AMBULATORY_CARE_PROVIDER_SITE_OTHER): Payer: Medicaid Other | Admitting: Obstetrics and Gynecology

## 2022-11-24 ENCOUNTER — Ambulatory Visit: Payer: Self-pay

## 2022-11-24 ENCOUNTER — Other Ambulatory Visit: Payer: Self-pay | Admitting: *Deleted

## 2022-11-24 ENCOUNTER — Other Ambulatory Visit: Payer: Self-pay

## 2022-11-24 VITALS — BP 114/76 | HR 67 | Wt 148.6 lb

## 2022-11-24 DIAGNOSIS — Z3482 Encounter for supervision of other normal pregnancy, second trimester: Secondary | ICD-10-CM

## 2022-11-24 DIAGNOSIS — Z3A21 21 weeks gestation of pregnancy: Secondary | ICD-10-CM | POA: Diagnosis not present

## 2022-11-24 DIAGNOSIS — O98112 Syphilis complicating pregnancy, second trimester: Secondary | ICD-10-CM

## 2022-11-24 DIAGNOSIS — Z3480 Encounter for supervision of other normal pregnancy, unspecified trimester: Secondary | ICD-10-CM | POA: Diagnosis not present

## 2022-11-24 DIAGNOSIS — Z3A20 20 weeks gestation of pregnancy: Secondary | ICD-10-CM | POA: Insufficient documentation

## 2022-11-24 DIAGNOSIS — Z363 Encounter for antenatal screening for malformations: Secondary | ICD-10-CM | POA: Insufficient documentation

## 2022-11-24 DIAGNOSIS — Z362 Encounter for other antenatal screening follow-up: Secondary | ICD-10-CM

## 2022-11-24 MED ORDER — PENICILLIN G BENZATHINE 1200000 UNIT/2ML IM SUSY
2.4000 10*6.[IU] | PREFILLED_SYRINGE | Freq: Once | INTRAMUSCULAR | Status: AC
  Administered 2022-11-24: 2.4 10*6.[IU] via INTRAMUSCULAR

## 2022-11-24 NOTE — Progress Notes (Signed)
   PRENATAL VISIT NOTE  Subjective:  Darlene Ortiz is a 26 y.o. G2P0010 at [redacted]w[redacted]d being seen today for ongoing prenatal care.  She is currently monitored for the following issues for this low-risk pregnancy and has Supervision of other normal pregnancy, antepartum and Syphilis affecting pregnancy on their problem list.  Patient reports no complaints.  Contractions: Not present. Vag. Bleeding: None.  Movement: Present. Denies leaking of fluid.   The following portions of the patient's history were reviewed and updated as appropriate: allergies, current medications, past family history, past medical history, past social history, past surgical history and problem list.   Objective:   Vitals:   11/24/22 1142  BP: 114/76  Pulse: 67  Weight: 148 lb 9.6 oz (67.4 kg)    Fetal Status: Fetal Heart Rate (bpm): 156 Fundal Height: 21 cm Movement: Present     General:  Alert, oriented and cooperative. Patient is in no acute distress.  Skin: Skin is warm and dry. No rash noted.   Cardiovascular: Normal heart rate noted  Respiratory: Normal respiratory effort, no problems with respiration noted  Abdomen: Soft, gravid, appropriate for gestational age.  Pain/Pressure: Absent     Pelvic: Cervical exam deferred        Extremities: Normal range of motion.     Mental Status: Normal mood and affect. Normal behavior. Normal judgment and thought content.   Assessment and Plan:  Pregnancy: G2P0010 at [redacted]w[redacted]d 1. Supervision of other normal pregnancy, antepartum Bicillin #3 today MSAFP today Has anatomy US today Rh positive   Preterm labor symptoms and general obstetric precautions including but not limited to vaginal bleeding, contractions, leaking of fluid and fetal movement were reviewed in detail with the patient. Please refer to After Visit Summary for other counseling recommendations.   Return in about 4 weeks (around 12/22/2022) for Mom-Baby.  Future Appointments  Date Time Provider Department  Center  11/24/2022  1:30 PM [facility] US3 [facility]  12/22/2022  9:15 AM Venora Maples, MD [facility]    Milas Hock, MD

## 2022-11-26 LAB — AFP, SERUM, OPEN SPINA BIFIDA
AFP MoM: 0.79
AFP Value: 60.8 ng/mL
Gest. Age on Collection Date: 21.4 weeks
Maternal Age At EDD: 27.8 yr
OSBR Risk 1 IN: 10000
Test Results:: NEGATIVE
Weight: 148 [lb_av]

## 2022-12-04 NOTE — L&D Delivery Note (Signed)
OB/GYN Faculty Practice Delivery Note  Darlene Ortiz is a 27 y.o. G2P0010 s/p SVD at [redacted]w[redacted]d. She was admitted for SOL.   ROM: 4h 62m with clear fluid GBS Status:  Positive/-- (04/08 1629) Maximum Maternal Temperature:  Temp (24hrs), Avg:97.9 F (36.6 C), Min:97.8 F (36.6 C), Max:98 F (36.7 C)    Labor Progress: Patient arrived at 7.5 cm dilation and progressed spontaneously .   Delivery Date/Time: 04/07/2023 at 0440 Delivery: Called to room and patient was complete and pushing. Head delivered in ROA position. No nuchal cord present. Shoulder and body delivered in usual fashion. Infant with spontaneous cry, placed on mother's abdomen, dried and stimulated. Cord clamped x 2 after 1-minute delay, and cut by FOB. Cord blood drawn. Placenta delivered spontaneously with gentle cord traction. Fundus firm with massage and Pitocin. Labia, perineum, vagina, and cervix inspected with 1st degree perineal laceration and multiple abrasions.   Placenta:  spontaneous, intact, 3 vessel cord  Complications: None Lacerations: 1st degree perineal  EBL: 127 mL Analgesia: Epidural    Infant: APGAR (1 MIN): 8   APGAR (5 MINS): 9   APGAR (10 MINS):    Weight: pending   Derrel Nip, MD  OB Fellow  04/07/2023 5:13 AM

## 2022-12-09 ENCOUNTER — Other Ambulatory Visit: Payer: Self-pay

## 2022-12-09 ENCOUNTER — Inpatient Hospital Stay (HOSPITAL_COMMUNITY)
Admission: AD | Admit: 2022-12-09 | Discharge: 2022-12-09 | Disposition: A | Discharge status: Home / Self Care | Payer: Medicaid Other | Attending: Obstetrics and Gynecology | Admitting: Obstetrics and Gynecology

## 2022-12-09 ENCOUNTER — Encounter (HOSPITAL_COMMUNITY): Payer: Self-pay | Admitting: Obstetrics and Gynecology

## 2022-12-09 DIAGNOSIS — O23592 Infection of other part of genital tract in pregnancy, second trimester: Secondary | ICD-10-CM | POA: Insufficient documentation

## 2022-12-09 DIAGNOSIS — O99612 Diseases of the digestive system complicating pregnancy, second trimester: Secondary | ICD-10-CM | POA: Diagnosis not present

## 2022-12-09 DIAGNOSIS — Z3A23 23 weeks gestation of pregnancy: Secondary | ICD-10-CM | POA: Insufficient documentation

## 2022-12-09 DIAGNOSIS — O2242 Hemorrhoids in pregnancy, second trimester: Secondary | ICD-10-CM | POA: Diagnosis not present

## 2022-12-09 DIAGNOSIS — K5900 Constipation, unspecified: Secondary | ICD-10-CM | POA: Insufficient documentation

## 2022-12-09 DIAGNOSIS — B3731 Acute candidiasis of vulva and vagina: Secondary | ICD-10-CM | POA: Diagnosis present

## 2022-12-09 DIAGNOSIS — O98812 Other maternal infectious and parasitic diseases complicating pregnancy, second trimester: Secondary | ICD-10-CM | POA: Insufficient documentation

## 2022-12-09 LAB — WET PREP, GENITAL
Clue Cells Wet Prep HPF POC: NONE SEEN
Sperm: NONE SEEN
Trich, Wet Prep: NONE SEEN
WBC, Wet Prep HPF POC: >=10 — AB (ref <10)

## 2022-12-09 MED ORDER — TERCONAZOLE 0.4 % VA CREA: 1.0000 | TOPICAL_CREAM | Freq: Every day | VAGINAL | 0 refills | Status: DC

## 2022-12-09 NOTE — Discharge Instructions (Signed)

## 2022-12-09 NOTE — MAU Provider Note (Signed)
History     [id number]  Arrival date and time: 12/09/22 7846    Chief Complaint  Patient presents with   Vaginal Discharge     HPI Darlene Ortiz is a 27 y.o. at [redacted]w[redacted]d who presents for vaginal discharge. Symptoms started Wednesday evening after intercourse. Reports thin yellow discharge. No odor to discharge. Endorses vaginal itching & irritation. Also reports hemorrhoids. States it is occasionally itchy or painful after bowel movements. Occasionally has constipation but currently able to have normal BMs. Not treating symptoms.  Denies dysuria, vaginal bleeding, LOF, or abdominal pain. Positive fetal movement.   OB History     Gravida  2   Para      Term      Preterm      AB  1   Living         SAB      IAB  1   Ectopic      Multiple      Live Births              Past Medical History:  Diagnosis Date   Syphilis 11/2022    Past Surgical History:  Procedure Laterality Date   TOOTH EXTRACTION      Family History  Problem Relation Age of Onset   Diabetes Mother    Breast cancer Maternal Grandmother     No Known Allergies  No current facility-administered medications on file prior to encounter.   Current Outpatient Medications on File Prior to Encounter  Medication Sig Dispense Refill   Prenatal 28-0.8 MG TABS Take 1 tablet by mouth daily. 30 tablet 12   aspirin EC 81 MG tablet Take 1 tablet (81 mg total) by mouth daily. Take after 12 weeks for prevention of preeclampsia later in pregnancy (Patient not taking: Reported on 11/10/2022) 300 tablet 2   Blood Pressure Monitoring DEVI 1 each by Does not apply route once a week. 1 each 0   Misc. Devices (GOJJI WEIGHT SCALE) MISC 1 each by Does not apply route once a week. (Patient not taking: Reported on 11/10/2022) 1 each 0     ROS Pertinent positives and negative per HPI, all others reviewed and negative  Physical Exam   BP 129/73 (BP Location: Right Arm)   Pulse 89   Temp 98.9 F (37.2 C) (Oral)    Resp 16   Ht 5\' 8"  (1.727 m)   Wt 69.7 kg   LMP 06/26/2022 (Exact Date)   SpO2 100% Comment: room air  BMI 23.37 kg/m   Patient Vitals for the past 24 hrs:  BP Temp Temp src Pulse Resp SpO2 Height Weight  12/09/22 0837 129/73 98.9 F (37.2 C) Oral 89 16 100 % -- --  12/09/22 0833 -- -- -- -- -- -- 5\' 8"  (1.727 m) 69.7 kg    Physical Exam Vitals and nursing note reviewed. Exam conducted with a chaperone present.  Constitutional:      General: She is not in acute distress.    Appearance: Normal appearance. She is not ill-appearing.  Eyes:     General: No scleral icterus.    Conjunctiva/sclera: Conjunctivae normal.  Pulmonary:     Effort: Pulmonary effort is normal. No respiratory distress.  Genitourinary:    Labia:        Right: No lesion.        Left: No lesion.      Rectum: External hemorrhoid present. No anal fissure.     Comments: ~1.5 cm flesh colored  hemorrhoid Neurological:     Mental Status: She is alert.  Psychiatric:        Mood and Affect: Mood normal.        Behavior: Behavior normal.       Labs Results for orders placed or performed during the hospital encounter of 12/09/22 (from the past 24 hour(s))  Wet prep, genital     Status: Abnormal   Collection Time: 12/09/22  8:53 AM   Specimen: Vaginal  Result Value Ref Range   Yeast Wet Prep HPF POC PRESENT (A) NONE SEEN   Trich, Wet Prep NONE SEEN NONE SEEN   Clue Cells Wet Prep HPF POC NONE SEEN NONE SEEN   WBC, Wet Prep HPF POC >=10 (A) <10   Sperm NONE SEEN     Imaging No results found.  MAU Course  Procedures Lab Orders         Wet prep, genital    Meds ordered this encounter  Medications   terconazole (TERAZOL 7) 0.4 % vaginal cream    Sig: Place 1 applicator vaginally at bedtime. Use for seven days    Dispense:  45 g    Refill:  0    Order Specific Question:   Supervising Provider    Answer:   Conan Bowens [[id number]]   Imaging Orders  No imaging studies ordered today    MDM FHT  present via doppler Wet prep & GC/CT collected. Wet prep positive for yeast. Will rx terazol.   Discussed management of hemorrhoids & treatment of constipation.   Patient & significant other questioning safety of unprotected intercourse - patient concerned if it is safe, partner would prefer not to use condoms. They were recently treated for syphilis. Discussed safe sex in regards to concern for STDs. Also stressed to patient that if she doesn't feel comfortable having unprotected intercourse that it is ultimately her decision.   Assessment and Plan   1. Vaginal yeast infection  -No intercourse during treatment & pending GC/CT results -Rx terazol  2. Hemorrhoids during pregnancy in second trimester  -Manage constipation -OTC treatment of symptoms. Given list of pregnant safe meds  3. [redacted] weeks gestation of pregnancy      [name], NP 12/09/22 9:44 AM

## 2022-12-09 NOTE — MAU Note (Signed)
Vaginal discharge and irritation since Thursday, had intercourse on Wednesday  Denies any vaginal bleeding or cramping

## 2022-12-09 NOTE — MAU Note (Signed)
Darlene Ortiz is a 26 y.o. at [redacted]w[redacted]d here in MAU reporting: vaginal discharge and irritation since Thursday. Discharge is yellow and thin. Is feeling some itching. No bleeding. No pain. +FM  Onset of complaint: ongoing  Pain score: 0/10  Vitals:   12/09/22 0837  BP: 129/73  Pulse: 89  Resp: 16  Temp: 98.9 F (37.2 C)  SpO2: 100%     FHT:146  Lab orders placed from triage: none

## 2022-12-11 LAB — GC/CHLAMYDIA PROBE AMP (~~LOC~~) NOT AT ARMC
Chlamydia: NEGATIVE
Comment: NEGATIVE
Comment: NORMAL
Neisseria Gonorrhea: NEGATIVE

## 2022-12-22 ENCOUNTER — Ambulatory Visit (INDEPENDENT_AMBULATORY_CARE_PROVIDER_SITE_OTHER): Payer: Medicaid Other | Admitting: Family Medicine

## 2022-12-22 ENCOUNTER — Encounter: Payer: Self-pay | Admitting: Family Medicine

## 2022-12-22 VITALS — BP 119/72 | HR 80 | Wt 160.9 lb

## 2022-12-22 DIAGNOSIS — K5900 Constipation, unspecified: Secondary | ICD-10-CM

## 2022-12-22 DIAGNOSIS — O98112 Syphilis complicating pregnancy, second trimester: Secondary | ICD-10-CM

## 2022-12-22 DIAGNOSIS — Z3A25 25 weeks gestation of pregnancy: Secondary | ICD-10-CM

## 2022-12-22 DIAGNOSIS — Z3480 Encounter for supervision of other normal pregnancy, unspecified trimester: Secondary | ICD-10-CM

## 2022-12-22 DIAGNOSIS — R87610 Atypical squamous cells of undetermined significance on cytologic smear of cervix (ASC-US): Secondary | ICD-10-CM | POA: Insufficient documentation

## 2022-12-22 DIAGNOSIS — O99612 Diseases of the digestive system complicating pregnancy, second trimester: Secondary | ICD-10-CM

## 2022-12-22 DIAGNOSIS — R87810 Cervical high risk human papillomavirus (HPV) DNA test positive: Secondary | ICD-10-CM

## 2022-12-22 MED ORDER — POLYETHYLENE GLYCOL 3350 17 GM/SCOOP PO POWD: 17.0000 g | Freq: Every day | ORAL | 1 refills | Status: DC | PRN

## 2022-12-22 NOTE — Patient Instructions (Signed)

## 2022-12-22 NOTE — Progress Notes (Signed)
Patient is asking about needing repeat RPR after treatment for herself and partner.

## 2022-12-22 NOTE — Progress Notes (Signed)
   Subjective:  Darlene Ortiz is a 27 y.o. G2P0010 at [redacted]w[redacted]d being seen today for ongoing prenatal care.  She is currently monitored for the following issues for this high-risk pregnancy and has Supervision of other normal pregnancy, antepartum; Syphilis affecting pregnancy; and ASCUS with positive high risk HPV cervical on their problem list.  Patient reports no complaints.  Contractions: Not present. Vag. Bleeding: None.  Movement: Present. Denies leaking of fluid.   The following portions of the patient's history were reviewed and updated as appropriate: allergies, current medications, past family history, past medical history, past social history, past surgical history and problem list. Problem list updated.  Objective:   Vitals:   12/22/22 0948  BP: 119/72  Pulse: 80  Weight: 160 lb 14.4 oz (73 kg)    Fetal Status: Fetal Heart Rate (bpm): 164   Movement: Present     General:  Alert, oriented and cooperative. Patient is in no acute distress.  Skin: Skin is warm and dry. No rash noted.   Cardiovascular: Normal heart rate noted  Respiratory: Normal respiratory effort, no problems with respiration noted  Abdomen: Soft, gravid, appropriate for gestational age. Pain/Pressure: Absent     Pelvic: Vag. Bleeding: None     Cervical exam deferred        Extremities: Normal range of motion.     Mental Status: Normal mood and affect. Normal behavior. Normal judgment and thought content.   Urinalysis:      Assessment and Plan:  Pregnancy: G2P0010 at [redacted]w[redacted]d  1. Supervision of other normal pregnancy, antepartum BP and FHR normal Questions about sex during pregnancy and if ejaculate will harm the vagina, discussed no sex is normal and healthy during pregnancy  2. Syphilis affecting pregnancy in second trimester S/p bicillin x3 Wondering about retesting Discussed we will not have results with good interpretation until 4-6 months after treatment However we will check RPR at next visit and at  admission to L&D per state law and this will give [place] some information, though not definitive  3. ASCUS with positive high risk HPV cervical Will need colpo PP  Preterm labor symptoms and general obstetric precautions including but not limited to vaginal bleeding, contractions, leaking of fluid and fetal movement were reviewed in detail with the patient. Please refer to After Visit Summary for other counseling recommendations.  Return in 2 weeks (on 01/05/2023) for Dyad patient, ob visit.   Clarnce Flock, MD

## 2023-01-05 ENCOUNTER — Ambulatory Visit (INDEPENDENT_AMBULATORY_CARE_PROVIDER_SITE_OTHER): Payer: Medicaid Other | Admitting: Family Medicine

## 2023-01-05 ENCOUNTER — Other Ambulatory Visit: Payer: Self-pay

## 2023-01-05 ENCOUNTER — Ambulatory Visit: Payer: Medicaid Other | Attending: Maternal & Fetal Medicine

## 2023-01-05 ENCOUNTER — Encounter: Payer: Self-pay | Admitting: Family Medicine

## 2023-01-05 ENCOUNTER — Ambulatory Visit: Payer: Medicaid Other | Admitting: *Deleted

## 2023-01-05 VITALS — BP 119/70 | HR 95

## 2023-01-05 VITALS — BP 124/86 | HR 91 | Wt 164.0 lb

## 2023-01-05 DIAGNOSIS — Z23 Encounter for immunization: Secondary | ICD-10-CM | POA: Diagnosis not present

## 2023-01-05 DIAGNOSIS — A539 Syphilis, unspecified: Secondary | ICD-10-CM | POA: Diagnosis not present

## 2023-01-05 DIAGNOSIS — O98112 Syphilis complicating pregnancy, second trimester: Secondary | ICD-10-CM | POA: Diagnosis present

## 2023-01-05 DIAGNOSIS — Z362 Encounter for other antenatal screening follow-up: Secondary | ICD-10-CM | POA: Diagnosis present

## 2023-01-05 DIAGNOSIS — Z3A27 27 weeks gestation of pregnancy: Secondary | ICD-10-CM | POA: Diagnosis not present

## 2023-01-05 DIAGNOSIS — R87810 Cervical high risk human papillomavirus (HPV) DNA test positive: Secondary | ICD-10-CM

## 2023-01-05 DIAGNOSIS — Z3A26 26 weeks gestation of pregnancy: Secondary | ICD-10-CM

## 2023-01-05 DIAGNOSIS — Z3480 Encounter for supervision of other normal pregnancy, unspecified trimester: Secondary | ICD-10-CM

## 2023-01-05 DIAGNOSIS — R87610 Atypical squamous cells of undetermined significance on cytologic smear of cervix (ASC-US): Secondary | ICD-10-CM

## 2023-01-05 DIAGNOSIS — O98113 Syphilis complicating pregnancy, third trimester: Secondary | ICD-10-CM

## 2023-01-05 NOTE — Progress Notes (Signed)
   Subjective:  Darlene Ortiz is a 27 y.o. G2P0010 at [redacted]w[redacted]d being seen today for ongoing prenatal care.  She is currently monitored for the following issues for this high-risk pregnancy and has Supervision of other normal pregnancy, antepartum; Syphilis affecting pregnancy; and ASCUS with positive high risk HPV cervical on their problem list.  Patient reports no complaints.  Contractions: Not present. Vag. Bleeding: None.  Movement: Present. Denies leaking of fluid.   The following portions of the patient's history were reviewed and updated as appropriate: allergies, current medications, past family history, past medical history, past social history, past surgical history and problem list. Problem list updated.  Objective:   Vitals:   01/05/23 0825  BP: 124/86  Pulse: 91  Weight: 164 lb (74.4 kg)    Fetal Status: Fetal Heart Rate (bpm): 147   Movement: Present     General:  Alert, oriented and cooperative. Patient is in no acute distress.  Skin: Skin is warm and dry. No rash noted.   Cardiovascular: Normal heart rate noted  Respiratory: Normal respiratory effort, no problems with respiration noted  Abdomen: Soft, gravid, appropriate for gestational age. Pain/Pressure: Absent     Pelvic: Vag. Bleeding: None     Cervical exam deferred        Extremities: Normal range of motion.     Mental Status: Normal mood and affect. Normal behavior. Normal judgment and thought content.   Urinalysis:      Assessment and Plan:  Pregnancy: G2P0010 at [redacted]w[redacted]d  1. Supervision of other normal pregnancy, antepartum BP and FHR normal 28 wk labs today Accepts TDaP  Lots of round ligament pain, discussed etiology and use of maternity belt PRN Has follow up growth [place] later this morning - Tdap vaccine greater than or equal to 7yo IM - CBC - Glucose Tolerance, 2 Hours w/1 Hour - HIV Antibody (routine testing w rflx) - RPR  2. Syphilis affecting pregnancy in third trimester S/p bicillin x3, check RPR  titer today  3. ASCUS with positive high risk HPV cervical Colpo PP  Preterm labor symptoms and general obstetric precautions including but not limited to vaginal bleeding, contractions, leaking of fluid and fetal movement were reviewed in detail with the patient. Please refer to After Visit Summary for other counseling recommendations.  Return in about 2 weeks (around 01/19/2023) for Dyad patient, ob visit.   Clarnce Flock, MD

## 2023-01-08 LAB — CBC
Hematocrit: 25.7 % — ABNORMAL LOW (ref 34.0–46.6)
Hemoglobin: 8.3 g/dL — ABNORMAL LOW (ref 11.1–15.9)
MCH: 25.6 pg — ABNORMAL LOW (ref 26.6–33.0)
MCHC: 32.3 g/dL (ref 31.5–35.7)
MCV: 79 fL (ref 79–97)
Platelets: 297 10*3/uL (ref 150–450)
RBC: 3.24 x10E6/uL — ABNORMAL LOW (ref 3.77–5.28)
RDW: 13.1 % (ref 11.7–15.4)
WBC: 6 10*3/uL (ref 3.4–10.8)

## 2023-01-08 LAB — RPR, QUANT+TP ABS (REFLEX)
Rapid Plasma Reagin, Quant: 1:2 {titer} — ABNORMAL HIGH
T Pallidum Abs: REACTIVE — AB

## 2023-01-08 LAB — GLUCOSE TOLERANCE, 2 HOURS W/ 1HR
Glucose, 1 hour: 101 mg/dL (ref 70–179)
Glucose, 2 hour: 81 mg/dL (ref 70–152)
Glucose, Fasting: 73 mg/dL (ref 70–91)

## 2023-01-08 LAB — HIV ANTIBODY (ROUTINE TESTING W REFLEX): HIV Screen 4th Generation wRfx: NONREACTIVE

## 2023-01-08 LAB — RPR: RPR Ser Ql: REACTIVE — AB

## 2023-01-09 ENCOUNTER — Encounter: Payer: Self-pay | Admitting: Family Medicine

## 2023-01-09 DIAGNOSIS — O99019 Anemia complicating pregnancy, unspecified trimester: Secondary | ICD-10-CM | POA: Insufficient documentation

## 2023-01-09 DIAGNOSIS — O99013 Anemia complicating pregnancy, third trimester: Secondary | ICD-10-CM | POA: Insufficient documentation

## 2023-01-16 ENCOUNTER — Other Ambulatory Visit: Payer: Self-pay | Admitting: Family Medicine

## 2023-01-16 ENCOUNTER — Other Ambulatory Visit: Payer: Self-pay | Admitting: Pharmacy Technician

## 2023-01-16 ENCOUNTER — Telehealth: Payer: Self-pay | Admitting: Pharmacy Technician

## 2023-01-16 NOTE — Progress Notes (Signed)
IV iron orders

## 2023-01-16 NOTE — Telephone Encounter (Signed)
Dr. Dione Plover, Juluis Rainier note:  Auth Submission: NO AUTH NEEDED Payer: [employer] Medication & CPT/J Code(s) submitted: Venofer (Iron Sucrose) [id number] Route of submission (phone, fax, portal):  Phone # Fax # Auth type: Buy/Bill Units/visits requested: X2 Reference number:  Approval from: 01/16/23 to 05/17/23   Patient will be scheduled as soon as possible

## 2023-01-19 ENCOUNTER — Ambulatory Visit (INDEPENDENT_AMBULATORY_CARE_PROVIDER_SITE_OTHER): Payer: Medicaid Other | Admitting: Family Medicine

## 2023-01-19 ENCOUNTER — Other Ambulatory Visit: Payer: Self-pay

## 2023-01-19 ENCOUNTER — Encounter: Payer: Self-pay | Admitting: Family Medicine

## 2023-01-19 VITALS — BP 115/69 | HR 85 | Wt 169.8 lb

## 2023-01-19 DIAGNOSIS — Z3480 Encounter for supervision of other normal pregnancy, unspecified trimester: Secondary | ICD-10-CM

## 2023-01-19 DIAGNOSIS — O98113 Syphilis complicating pregnancy, third trimester: Secondary | ICD-10-CM

## 2023-01-19 DIAGNOSIS — R87610 Atypical squamous cells of undetermined significance on cytologic smear of cervix (ASC-US): Secondary | ICD-10-CM

## 2023-01-19 DIAGNOSIS — Z3A28 28 weeks gestation of pregnancy: Secondary | ICD-10-CM

## 2023-01-19 DIAGNOSIS — R87810 Cervical high risk human papillomavirus (HPV) DNA test positive: Secondary | ICD-10-CM

## 2023-01-19 DIAGNOSIS — O99013 Anemia complicating pregnancy, third trimester: Secondary | ICD-10-CM

## 2023-01-19 NOTE — Progress Notes (Signed)
   Subjective:  Darlene Ortiz is a 27 y.o. G2P0010 at 80w4dbeing seen today for ongoing prenatal care.  She is currently monitored for the following issues for this high-risk pregnancy and has Supervision of other normal pregnancy, antepartum; Syphilis affecting pregnancy; ASCUS with positive high risk HPV cervical; and Anemia of pregnancy on their problem list.  Patient reports no complaints.  Contractions: Not present. Vag. Bleeding: None.  Movement: Present. Denies leaking of fluid.   The following portions of the patient's history were reviewed and updated as appropriate: allergies, current medications, past family history, past medical history, past social history, past surgical history and problem list. Problem list updated.  Objective:   Vitals:   01/19/23 1115  BP: 115/69  Pulse: 85  Weight: 169 lb 12.8 oz (77 kg)    Fetal Status: Fetal Heart Rate (bpm): 140   Movement: Present     General:  Alert, oriented and cooperative. Patient is in no acute distress.  Skin: Skin is warm and dry. No rash noted.   Cardiovascular: Normal heart rate noted  Respiratory: Normal respiratory effort, no problems with respiration noted  Abdomen: Soft, gravid, appropriate for gestational age. Pain/Pressure: Present     Pelvic: Vag. Bleeding: None     Cervical exam deferred        Extremities: Normal range of motion.  Edema: None  Mental Status: Normal mood and affect. Normal behavior. Normal judgment and thought content.   Urinalysis:      Assessment and Plan:  Pregnancy: G2P0010 at 251w4d1. Supervision of other normal pregnancy, antepartum BP and FHR normal Discussed contraception, desires POP's  2. Syphilis affecting pregnancy in third trimester Falling titers, rechck around 36 weeks  3. ASCUS with positive high risk HPV cervical Needs colpo PP  4. Anemia during pregnancy in third trimester Lab Results  Component Value Date   HGB 8.3 (L) 01/05/2023   Scheduled for IV iron  infusions  Preterm labor symptoms and general obstetric precautions including but not limited to vaginal bleeding, contractions, leaking of fluid and fetal movement were reviewed in detail with the patient. Please refer to After Visit Summary for other counseling recommendations.  Return in 2 weeks (on 02/02/2023) for Dyad patient, ob visit.   EcClarnce FlockMD

## 2023-01-19 NOTE — Patient Instructions (Signed)

## 2023-01-22 ENCOUNTER — Ambulatory Visit (INDEPENDENT_AMBULATORY_CARE_PROVIDER_SITE_OTHER): Payer: Medicaid Other

## 2023-01-22 VITALS — BP 118/70 | HR 86 | Temp 97.9°F | Resp 18 | Ht 68.0 in | Wt 169.4 lb

## 2023-01-22 DIAGNOSIS — Z3A29 29 weeks gestation of pregnancy: Secondary | ICD-10-CM

## 2023-01-22 DIAGNOSIS — Z3480 Encounter for supervision of other normal pregnancy, unspecified trimester: Secondary | ICD-10-CM

## 2023-01-22 DIAGNOSIS — O99013 Anemia complicating pregnancy, third trimester: Secondary | ICD-10-CM

## 2023-01-22 MED ORDER — IRON SUCROSE 500 MG IVPB - SIMPLE MED
500.0000 mg | INTRAVENOUS | Status: DC
  Administered 2023-01-22: 500 mg via INTRAVENOUS
  Filled 2023-01-22: qty 500

## 2023-01-22 MED ORDER — ACETAMINOPHEN 325 MG PO TABS
650.0000 mg | ORAL_TABLET | Freq: Once | ORAL | Status: AC
  Administered 2023-01-22: 650 mg via ORAL
  Filled 2023-01-22: qty 2

## 2023-01-22 MED ORDER — DIPHENHYDRAMINE HCL 25 MG PO CAPS
25.0000 mg | ORAL_CAPSULE | Freq: Once | ORAL | Status: AC
  Administered 2023-01-22: 25 mg via ORAL
  Filled 2023-01-22: qty 1

## 2023-01-22 NOTE — Patient Instructions (Signed)

## 2023-01-22 NOTE — Progress Notes (Signed)
Diagnosis: Iron Deficiency Anemia  Provider:  Marshell Garfinkel MD  Procedure: Infusion  IV Type: Peripheral, IV Location: L Forearm  Venofer (Iron Sucrose), Dose: 500 mg  Infusion Start Time: [id number]  Infusion Stop Time: [id number]  Post Infusion IV Care: Observation period completed and Peripheral IV Discontinued  Discharge: Condition: Good, Destination: Home . AVS Provided  Performed by:  Arnoldo Morale, RN

## 2023-01-30 ENCOUNTER — Inpatient Hospital Stay (HOSPITAL_COMMUNITY)
Admission: AD | Admit: 2023-01-30 | Discharge: 2023-01-30 | Disposition: A | Discharge status: Home / Self Care | Payer: Medicaid Other | Attending: Family Medicine | Admitting: Family Medicine

## 2023-01-30 DIAGNOSIS — Z3A30 30 weeks gestation of pregnancy: Secondary | ICD-10-CM | POA: Insufficient documentation

## 2023-01-30 DIAGNOSIS — O98813 Other maternal infectious and parasitic diseases complicating pregnancy, third trimester: Secondary | ICD-10-CM | POA: Insufficient documentation

## 2023-01-30 DIAGNOSIS — O23593 Infection of other part of genital tract in pregnancy, third trimester: Secondary | ICD-10-CM | POA: Insufficient documentation

## 2023-01-30 DIAGNOSIS — B3731 Acute candidiasis of vulva and vagina: Secondary | ICD-10-CM | POA: Insufficient documentation

## 2023-01-30 DIAGNOSIS — Z3480 Encounter for supervision of other normal pregnancy, unspecified trimester: Secondary | ICD-10-CM

## 2023-01-30 DIAGNOSIS — N898 Other specified noninflammatory disorders of vagina: Secondary | ICD-10-CM | POA: Diagnosis present

## 2023-01-30 LAB — URINALYSIS, ROUTINE W REFLEX MICROSCOPIC
Bilirubin Urine: NEGATIVE
Glucose, UA: NEGATIVE mg/dL
Ketones, ur: NEGATIVE mg/dL
Nitrite: NEGATIVE
Protein, ur: NEGATIVE mg/dL
Specific Gravity, Urine: 1.015 (ref 1.005–1.030)
pH: 6.5 (ref 5.0–8.0)

## 2023-01-30 LAB — URINALYSIS, MICROSCOPIC (REFLEX)

## 2023-01-30 LAB — WET PREP, GENITAL
Clue Cells Wet Prep HPF POC: NONE SEEN
Sperm: NONE SEEN
Trich, Wet Prep: NONE SEEN
WBC, Wet Prep HPF POC: >=10 — AB (ref <10)

## 2023-01-30 MED ORDER — FLUCONAZOLE 150 MG PO TABS: 150.0000 mg | ORAL_TABLET | Freq: Once | ORAL | 3 refills | Status: AC

## 2023-01-30 NOTE — MAU Note (Addendum)
.  Darlene Ortiz is a 27 y.o. at 35w1dhere in MAU reporting vaginal d/c since Sat. No odor but does report itching and irritation. Discharge is white and curdy. Reports good FM and denies VB or LOF  Onset of complaint: Sat Pain score: 0 Vitals:   01/30/23 1952 01/30/23 1953  BP:  123/66  Pulse: (!) 103   Resp: 17   Temp: 97.6 F (36.4 C)   SpO2: 100%      FHT:140 Lab orders placed from triage:  none in Triage

## 2023-01-30 NOTE — MAU Provider Note (Signed)
History     CSN: [id number]  Arrival date and time: 01/30/23 [id number]   Event Date/Time   First Provider Initiated Contact with Patient 01/30/23 2023      Chief Complaint  Patient presents with   Vaginal Discharge   Vaginal Discharge The patient's primary symptoms include vaginal discharge. The patient's pertinent negatives include no pelvic pain. Pertinent negatives include no abdominal pain, back pain, chills, diarrhea, dysuria, fever, flank pain, nausea, rash, sore throat or vomiting.   Patient is 27 y.o. G2P0010 27w1dhere with complaints of vaginal irritation that started on Saturday and has worsened. Associated with thick "gunky white" discharge.  +FM, denies LOF, VB, contractions,   OB History     Gravida  2   Para      Term      Preterm      AB  1   Living         SAB      IAB  1   Ectopic      Multiple      Live Births              Past Medical History:  Diagnosis Date   Syphilis 11/2022    Past Surgical History:  Procedure Laterality Date   TOOTH EXTRACTION      Family History  Problem Relation Age of Onset   Diabetes Mother    Breast cancer Maternal Grandmother     Social History   Tobacco Use   Smoking status: Never   Smokeless tobacco: Never  Vaping Use   Vaping Use: Never used  Substance Use Topics   Alcohol use: Not Currently    Comment: occassionally   Drug use: Not Currently    Types: Marijuana    Comment: May 2021    Allergies: No Known Allergies  Medications Prior to Admission  Medication Sig Dispense Refill Last Dose   aspirin EC 81 MG tablet Take 1 tablet (81 mg total) by mouth daily. Take after 12 weeks for prevention of preeclampsia later in pregnancy (Patient not taking: Reported on 01/19/2023) 300 tablet 2    Blood Pressure Monitoring DEVI 1 each by Does not apply route once a week. 1 each 0    polyethylene glycol powder (GLYCOLAX/MIRALAX) 17 GM/SCOOP powder Take 17 g by mouth daily as needed. 510 g 1     Prenatal 28-0.8 MG TABS Take 1 tablet by mouth daily. 30 tablet 12    terconazole (TERAZOL 7) 0.4 % vaginal cream Place 1 applicator vaginally at bedtime. Use for seven days (Patient not taking: Reported on 01/19/2023) 45 g 0     Review of Systems  Constitutional:  Negative for chills and fever.  HENT:  Negative for congestion and sore throat.   Eyes:  Negative for pain and visual disturbance.  Respiratory:  Negative for cough, chest tightness and shortness of breath.   Cardiovascular:  Negative for chest pain.  Gastrointestinal:  Negative for abdominal pain, diarrhea, nausea and vomiting.  Endocrine: Negative for cold intolerance and heat intolerance.  Genitourinary:  Positive for vaginal discharge. Negative for dysuria, flank pain, pelvic pain and vaginal bleeding.  Musculoskeletal:  Negative for back pain.  Skin:  Negative for rash.  Allergic/Immunologic: Negative for food allergies.  Neurological:  Negative for dizziness and light-headedness.  Psychiatric/Behavioral:  Negative for agitation.    Physical Exam   Blood pressure 123/66, pulse (!) 103, temperature 97.6 F (36.4 C), resp. rate 17, height '5\' 8"'$  (1.727 m), weight  79.4 kg, last menstrual period 06/26/2022, SpO2 100 %.  Physical Exam Vitals and nursing note reviewed.  Constitutional:      General: She is not in acute distress.    Appearance: She is well-developed.     Comments: Pregnant female  HENT:     Head: Normocephalic and atraumatic.  Eyes:     General: No scleral icterus.    Conjunctiva/sclera: Conjunctivae normal.  Cardiovascular:     Rate and Rhythm: Normal rate.  Pulmonary:     Effort: Pulmonary effort is normal.  Chest:     Chest wall: No tenderness.  Abdominal:     Palpations: Abdomen is soft.     Tenderness: There is no abdominal tenderness. There is no guarding or rebound.     Comments: Gravid  Genitourinary:    Vagina: Normal.  Musculoskeletal:        General: Normal range of motion.      Cervical back: Normal range of motion and neck supple.  Skin:    General: Skin is warm and dry.     Findings: No rash.  Neurological:     Mental Status: She is alert and oriented to person, place, and time.     MAU Course  Procedures  MDM- low  Symptoms and testing consistent with yeast. Will treat   Results for orders placed or performed during the hospital encounter of 01/30/23 (from the past 24 hour(s))  Wet prep, genital     Status: Abnormal   Collection Time: 01/30/23  7:48 PM  Result Value Ref Range   Yeast Wet Prep HPF POC PRESENT (A) NONE SEEN   Trich, Wet Prep NONE SEEN NONE SEEN   Clue Cells Wet Prep HPF POC NONE SEEN NONE SEEN   WBC, Wet Prep HPF POC >=10 (A) <10   Sperm NONE SEEN      Assessment and Plan  . 1. Yeast vaginitis   2. Supervision of other normal pregnancy, antepartum   3. [redacted] weeks gestation of pregnancy    Treat with diflucan Has follow up on Friday if not improved GC/CT results are pending  Future Appointments  Date Time Provider [place]  02/02/2023  9:35 AM Clarnce Flock, MD [facility]  02/05/2023  8:30 AM [id number] None  02/16/2023 10:35 AM Clarnce Flock, MD [facility]  03/01/2023  1:35 PM Clarnce Flock, MD [facility]     Juanita Craver [facility] 01/30/2023, 8:23 PM

## 2023-01-31 LAB — GC/CHLAMYDIA PROBE AMP (~~LOC~~) NOT AT ARMC
Chlamydia: NEGATIVE
Comment: NEGATIVE
Comment: NORMAL
Neisseria Gonorrhea: NEGATIVE

## 2023-02-02 ENCOUNTER — Encounter: Payer: Self-pay | Admitting: Family Medicine

## 2023-02-02 ENCOUNTER — Ambulatory Visit (INDEPENDENT_AMBULATORY_CARE_PROVIDER_SITE_OTHER): Payer: Medicaid Other | Admitting: Family Medicine

## 2023-02-02 VITALS — BP 118/74 | HR 91 | Wt 178.4 lb

## 2023-02-02 DIAGNOSIS — O99013 Anemia complicating pregnancy, third trimester: Secondary | ICD-10-CM

## 2023-02-02 DIAGNOSIS — Z3480 Encounter for supervision of other normal pregnancy, unspecified trimester: Secondary | ICD-10-CM

## 2023-02-02 DIAGNOSIS — R87610 Atypical squamous cells of undetermined significance on cytologic smear of cervix (ASC-US): Secondary | ICD-10-CM

## 2023-02-02 DIAGNOSIS — Z3A30 30 weeks gestation of pregnancy: Secondary | ICD-10-CM

## 2023-02-02 DIAGNOSIS — R87810 Cervical high risk human papillomavirus (HPV) DNA test positive: Secondary | ICD-10-CM

## 2023-02-02 DIAGNOSIS — O98113 Syphilis complicating pregnancy, third trimester: Secondary | ICD-10-CM

## 2023-02-02 MED ORDER — FLUCONAZOLE 150 MG PO TABS: 150.0000 mg | ORAL_TABLET | Freq: Once | ORAL | 0 refills | Status: AC

## 2023-02-02 NOTE — Patient Instructions (Addendum)
Considering Waterbirth? Guide for patients at [facility]) Why consider waterbirth? Gentle birth for babies  Less pain medicine used in labor  May allow for passive descent/less pushing  May reduce perineal tears  More mobility and instinctive maternal position changes  Increased maternal relaxation   Is waterbirth safe? What are the risks of infection, drowning or other complications? Infection:  Very low risk (3.7 % for tub vs 4.8% for bed)  7 in 8000 waterbirths with documented infection  Poorly cleaned equipment most common cause  Slightly lower group B strep transmission rate  Drowning  Maternal:  Very low risk  Related to seizures or fainting  Newborn:  Very low risk. No evidence of increased risk of respiratory problems in multiple large studies  Physiological protection from breathing under water  Avoid underwater birth if there are any fetal complications  Once baby's head is out of the water, keep it out.  Birth complication  Some reports of cord trauma, but risk decreased by bringing baby to surface gradually  No evidence of increased risk of shoulder dystocia. Mothers can usually change positions faster in water than in a bed, possibly aiding the maneuvers to free the shoulder.   There are 2 things you MUST do to have a waterbirth with [facility]: Attend a waterbirth class at [place] [facility]   3rd Wednesday of every month from 7-9 pm (virtual during [place]) [employer] at [email or web address] or [email or web address] or by calling [phone] Bring [place] the certificate from the class to your prenatal appointment or send via Blanket with a midwife at 36 weeks* to see if you can still plan a waterbirth and to sign the consent.   *We also recommend that you schedule as many of your prenatal visits with a midwife as possible.    Helpful information: You may want to bring a bathing suit top to the hospital  to wear during labor but this is optional.  All other supplies are provided by the hospital. Please arrive at the hospital with signs of active labor, and do not wait at home until late in labor. It takes 45 min- 1 hour for fetal monitoring, and check in to your room to take place, plus transport and filling of the waterbirth tub.    Things that would prevent you from having a waterbirth: Premature, <37wks  Previous cesarean birth  Presence of thick meconium-stained fluid  Multiple gestation (Twins, triplets, etc.)  Uncontrolled diabetes or gestational diabetes requiring medication  Hypertension diagnosed in pregnancy or preexisting hypertension (gestational hypertension, preeclampsia, or chronic hypertension) Fetal growth restriction (your baby measures less than 10th percentile on ultrasound) Heavy vaginal bleeding  Non-reassuring fetal heart rate  Active infection (MRSA, etc.). Group B Strep is NOT a contraindication for waterbirth.  If your labor has to be induced and induction method requires continuous monitoring of the baby's heart rate  Other risks/issues identified by your obstetrical provider   Please remember that birth is unpredictable. Under certain unforeseeable circumstances your provider may advise against giving birth in the tub. These decisions will be made on a case-by-case basis and with the safety of you and your baby as our highest priority.    Updated 03/08/22     Dental Resources   [place]   Dr. Galvin Proffer  Exam $85   [place] [address]  Extraction $120 and up   [place], [place]  *full list of prices [phone]  [place]  Exam [phone]   [place]  Exam w/ Xrays $380   [place], [place]  Xrays $68 and up   [phone]  Cleaning $101   Extraction $190 and up      [facility]  Cleaning + Xray $344   [address]  Extraction- pt has to be seen first to give price   [place], [place]   [phone]      [facility]   Dr. Suezanne Jacquet Turner/Dr. Daneen Schick  Exam, Cleaning, Xray $262   [place]  Extraction [phone]   [place]   [place] [facility]  Cleaning $5   [address] $5   [place], [place] [address]  Call to get on waiting list   [phone] ext [phone]     Dr. Jeneen Rinks [place]/Dr. Darel Hong   [address]  Xray $85 Each   [place], [place] [address]  Extraction [phone]    Dr. Ivin Poot  Extraction $300 per tooth   [address] [place], [place] [address]   [phone]      Dr. Ladell Pier  Cleaning $300   [address]  [place]   [place], [place] [address]   [facility]  Emergency Exam $65   [place], [place] [address]  Extractions: Simple $180 Surgical $250   [phone]  Fillings [phone]      Contraception Choices Contraception, also called birth control, refers to methods or devices that prevent pregnancy. Hormonal methods  Contraceptive implant A contraceptive implant is a thin, plastic tube that contains a hormone that prevents pregnancy. It is different from an intrauterine device (IUD). It is inserted into the upper part of the arm by a health care provider. Implants can be effective for up to 3 years. Progestin-only injections Progestin-only injections are injections of progestin, a synthetic form of the hormone progesterone. They are given every 3 months by a health care provider. Birth control pills Birth control pills are pills that contain hormones that prevent pregnancy. They must be taken once a day, preferably at the same time each day. A prescription is needed to use this method of contraception. Birth control patch The birth control patch contains hormones that prevent pregnancy. It is placed on the skin and must be changed once a week for three weeks and removed on the fourth week. A prescription is needed to use this method of  contraception. Vaginal ring A vaginal ring contains hormones that prevent pregnancy. It is placed in the vagina for three weeks and removed on the fourth week. After that, the process is repeated with a new ring. A prescription is needed to use this method of contraception. Emergency contraceptive Emergency contraceptives prevent pregnancy after unprotected sex. They come in pill form and can be taken up to 5 days after sex. They work best the sooner they are taken after having sex. Most emergency contraceptives are available without a prescription. This method should not be used as your only form of birth control. Barrier methods  Female condom A female condom is a thin sheath that is worn over the penis during sex. Condoms keep sperm from going inside a woman's body. They can be used with a sperm-killing substance (spermicide) to increase their effectiveness. They should be thrown away after one use.  Female condom A female condom is a soft, loose-fitting sheath that is put into the vagina before sex. The condom keeps sperm from going inside a woman's body. They should be thrown away after one use. Diaphragm A diaphragm is a soft, dome-shaped barrier. It is inserted into the vagina before sex, along with a spermicide. The diaphragm blocks sperm from entering the uterus, and the spermicide kills sperm. A diaphragm should be left in the vagina for 6-8 hours after sex and removed within 24 hours. A diaphragm is prescribed and fitted by a health care provider. A diaphragm should be replaced every 1-2 years, after giving birth, after gaining more than 15 lb (6.8 kg), and after pelvic surgery. Cervical cap A cervical cap is a round, soft latex or plastic cup that fits over the cervix. It is inserted into the vagina before sex, along with spermicide. It blocks sperm from entering the uterus. The cap should be left in place for 6-8 hours after sex and removed within 48 hours. A cervical cap must be prescribed  and fitted by a health care provider. It should be replaced every 2 years. Sponge A sponge is a soft, circular piece of polyurethane foam with spermicide in it. The sponge helps block sperm from entering the uterus, and the spermicide kills sperm. To use it, you make it wet and then insert it into the vagina. It should be inserted before sex, left in for at least 6 hours after sex, and removed and thrown away within 30 hours. Spermicides Spermicides are chemicals that kill or block sperm from entering the cervix and uterus. They can come as a cream, jelly, suppository, foam, or tablet. A spermicide should be inserted into the vagina with an applicator at least [id number] minutes before sex to allow time for it to work. The process must be repeated every time you have sex. Spermicides do not require a prescription. Intrauterine contraception Intrauterine device (IUD) An IUD is a T-shaped device that is put in a woman's uterus. There are two types: Hormone IUD.This type contains progestin, a synthetic form of the hormone progesterone. This type can stay in place for 3-5 years. Copper IUD.This type is wrapped in copper wire. It can stay in place for 10 years. Permanent methods of contraception Female tubal ligation In this method, a woman's fallopian tubes are sealed, tied, or blocked during surgery to prevent eggs from traveling to the uterus. Hysteroscopic sterilization In this method, a small, flexible insert is placed into each fallopian tube. The inserts cause scar tissue to form in the fallopian tubes and block them, so sperm cannot reach an egg. The procedure takes about 3 months to be effective. Another form of birth control must be used during those 3 months. Female sterilization This is a procedure to tie off the tubes that carry sperm (vasectomy). After the procedure, the man can still ejaculate fluid (semen). Another form of birth control must be used for 3 months after the procedure. Natural  planning methods Natural family planning In this method, a couple does not have sex on days when the woman could become pregnant. Calendar method In this method, the woman keeps track of the length of each menstrual cycle, identifies the days when pregnancy can happen, and does not have sex on those days. Ovulation method In this method, a couple avoids sex during ovulation. Symptothermal method This method involves not having sex during ovulation. The woman typically checks for ovulation by watching changes in her temperature and in  the consistency of cervical mucus. Post-ovulation method In this method, a couple waits to have sex until after ovulation. Where to find more information Centers for Disease Control and Prevention: http://www.wolf.info/ Summary Contraception, also called birth control, refers to methods or devices that prevent pregnancy. Hormonal methods of contraception include implants, injections, pills, patches, vaginal rings, and emergency contraceptives. Barrier methods of contraception can include female condoms, female condoms, diaphragms, cervical caps, sponges, and spermicides. There are two types of IUDs (intrauterine devices). An IUD can be put in a woman's uterus to prevent pregnancy for 3-5 years. Permanent sterilization can be done through a procedure for males and females. Natural family planning methods involve nothaving sex on days when the woman could become pregnant. This information is not intended to replace advice given to you by your health care provider. Make sure you discuss any questions you have with your health care provider. Document Revised: 04/26/2020 Document Reviewed: 04/26/2020 Elsevier Patient Education  [place].

## 2023-02-02 NOTE — Progress Notes (Signed)
   Subjective:  Darlene Ortiz is a 27 y.o. G2P0010 at 71w4dbeing seen today for ongoing prenatal care.  She is currently monitored for the following issues for this high-risk pregnancy and has Supervision of other normal pregnancy, antepartum; Syphilis affecting pregnancy; ASCUS with positive high risk HPV cervical; Anemia of pregnancy; and Orbit fracture, right, closed, initial encounter ([facility] on their problem list.  Patient reports no complaints.  Contractions: Not present. Vag. Bleeding: None.  Movement: Present. Denies leaking of fluid.   The following portions of the patient's history were reviewed and updated as appropriate: allergies, current medications, past family history, past medical history, past social history, past surgical history and problem list. Problem list updated.  Objective:   Vitals:   02/02/23 1016  BP: 118/74  Pulse: 91  Weight: 178 lb 6.4 oz (80.9 kg)    Fetal Status: Fetal Heart Rate (bpm): 137 Fundal Height: 32 cm Movement: Present     General:  Alert, oriented and cooperative. Patient is in no acute distress.  Skin: Skin is warm and dry. No rash noted.   Cardiovascular: Normal heart rate noted  Respiratory: Normal respiratory effort, no problems with respiration noted  Abdomen: Soft, gravid, appropriate for gestational age. Pain/Pressure: Absent     Pelvic: Vag. Bleeding: None     Cervical exam deferred        Extremities: Normal range of motion.     Mental Status: Normal mood and affect. Normal behavior. Normal judgment and thought content.   Urinalysis:      Assessment and Plan:  Pregnancy: G2P0010 at 362w4d1. Supervision of other normal pregnancy, antepartum BP and FHR normal Ongoing symptoms of yeast vaginitis, refill sent for diflucan Interested in waterbirth, information given in AVS for class, will schedule with Dr. NeErnestina Patchesor upcoming visit Trying to see a dentist but was told she needs a referral, we will help facilitate this  2.  Syphilis affecting pregnancy in third trimester Falling titers Recheck at 36 weeks  3. ASCUS with positive high risk HPV cervical Needs colpo PP  4. Anemia during pregnancy in third trimester Lab Results  Component Value Date   HGB 8.3 (L) 01/05/2023  S/p first dose of IV venofer, has second dose scheduled for next week   Preterm labor symptoms and general obstetric precautions including but not limited to vaginal bleeding, contractions, leaking of fluid and fetal movement were reviewed in detail with the patient. Please refer to After Visit Summary for other counseling recommendations.  Return in 2 weeks (on 02/16/2023) for [place] patient, ob visit.   EcClarnce FlockMD

## 2023-02-05 ENCOUNTER — Ambulatory Visit (INDEPENDENT_AMBULATORY_CARE_PROVIDER_SITE_OTHER): Payer: Medicaid Other

## 2023-02-05 VITALS — BP 104/65 | HR 81 | Temp 97.9°F | Resp 18 | Ht 68.0 in | Wt 178.8 lb

## 2023-02-05 DIAGNOSIS — Z3A31 31 weeks gestation of pregnancy: Secondary | ICD-10-CM | POA: Diagnosis not present

## 2023-02-05 DIAGNOSIS — Z3480 Encounter for supervision of other normal pregnancy, unspecified trimester: Secondary | ICD-10-CM

## 2023-02-05 DIAGNOSIS — O99013 Anemia complicating pregnancy, third trimester: Secondary | ICD-10-CM

## 2023-02-05 MED ORDER — DIPHENHYDRAMINE HCL 25 MG PO CAPS
25.0000 mg | ORAL_CAPSULE | Freq: Once | ORAL | Status: AC
  Administered 2023-02-05: 25 mg via ORAL
  Filled 2023-02-05: qty 1

## 2023-02-05 MED ORDER — ACETAMINOPHEN 325 MG PO TABS
650.0000 mg | ORAL_TABLET | Freq: Once | ORAL | Status: AC
  Administered 2023-02-05: 650 mg via ORAL
  Filled 2023-02-05: qty 2

## 2023-02-05 MED ORDER — IRON SUCROSE 500 MG IVPB - SIMPLE MED
500.0000 mg | INTRAVENOUS | Status: DC
  Administered 2023-02-05: 500 mg via INTRAVENOUS
  Filled 2023-02-05: qty 475

## 2023-02-05 NOTE — Progress Notes (Signed)
Diagnosis: Iron Deficiency Anemia  Provider:  Marshell Garfinkel MD  Procedure: Infusion  IV Type: Peripheral, IV Location: L Antecubital  Venofer (Iron Sucrose), Dose: 500 mg  Infusion Start Time: [id number]  Infusion Stop Time: 1340  Post Infusion IV Care: Peripheral IV Discontinued  Discharge: Condition: Good, Destination: Home . AVS Declined  Performed by:  Adelina Mings, LPN

## 2023-02-16 ENCOUNTER — Encounter: Payer: Self-pay | Admitting: Family Medicine

## 2023-02-16 ENCOUNTER — Other Ambulatory Visit: Payer: Self-pay

## 2023-02-16 ENCOUNTER — Ambulatory Visit (INDEPENDENT_AMBULATORY_CARE_PROVIDER_SITE_OTHER): Payer: Medicaid Other | Admitting: Obstetrics and Gynecology

## 2023-02-16 VITALS — BP 128/74 | HR 86 | Wt 179.4 lb

## 2023-02-16 DIAGNOSIS — Z3480 Encounter for supervision of other normal pregnancy, unspecified trimester: Secondary | ICD-10-CM

## 2023-02-16 DIAGNOSIS — Z3A32 32 weeks gestation of pregnancy: Secondary | ICD-10-CM

## 2023-02-16 DIAGNOSIS — O99013 Anemia complicating pregnancy, third trimester: Secondary | ICD-10-CM

## 2023-02-16 DIAGNOSIS — B3731 Acute candidiasis of vulva and vagina: Secondary | ICD-10-CM

## 2023-02-16 DIAGNOSIS — R87810 Cervical high risk human papillomavirus (HPV) DNA test positive: Secondary | ICD-10-CM

## 2023-02-16 DIAGNOSIS — R87610 Atypical squamous cells of undetermined significance on cytologic smear of cervix (ASC-US): Secondary | ICD-10-CM

## 2023-02-16 DIAGNOSIS — O98113 Syphilis complicating pregnancy, third trimester: Secondary | ICD-10-CM

## 2023-02-16 MED ORDER — FLUCONAZOLE 150 MG PO TABS: 150.0000 mg | ORAL_TABLET | Freq: Once | ORAL | 0 refills | Status: AC

## 2023-02-16 NOTE — Progress Notes (Signed)
   Subjective:  Darlene Ortiz is a 27 y.o. G2P0010 at [redacted]w[redacted]d being seen today for ongoing prenatal care.  She is currently monitored for the following issues for this high-risk pregnancy and has Supervision of other normal pregnancy, antepartum; Syphilis affecting pregnancy; ASCUS with positive high risk HPV cervical; Anemia of pregnancy; and Orbit fracture, right, closed, initial encounter [facility]) on their problem list.  Patient reports vaginal discharge. Went to [facility] and treated for a yeast infection on 01/30/23. C/o white discharge and itching. Denies pelvic pain, abd pain, back pain, chills, fever.   Contractions: Not present. Vag. Bleeding: None.  Movement: Present. Denies leaking of fluid.   The following portions of the patient's history were reviewed and updated as appropriate: allergies, current medications, past family history, past medical history, past social history, past surgical history and problem list. Problem list updated.  Objective:   Vitals:   02/16/23 1109  BP: 128/74  Pulse: 86  Weight: 179 lb 6.4 oz (81.4 kg)    Fetal Status: Fetal Heart Rate (bpm): 140 Fundal Height: 32 cm Movement: Present     General:  Alert, oriented and cooperative. Patient is in no acute distress.  Skin: Skin is warm and dry. No rash noted.   Cardiovascular: Normal heart rate noted  Respiratory: Normal respiratory effort, no problems with respiration noted  Abdomen: Soft, gravid, appropriate for gestational age. Pain/Pressure: Absent     Pelvic: Vag. Bleeding: None     Cervical exam deferred        Extremities: Normal range of motion.  Edema: None  Mental Status: Normal mood and affect. Normal behavior. Normal judgment and thought content.   Urinalysis:      Assessment and Plan:  Pregnancy: G2P0010 at [redacted]w[redacted]d  1. Supervision of other normal pregnancy, antepartum BP and FHR normal   2. Syphilis affecting pregnancy in third trimester Plan to recheck at 36 weeks   3. Anemia during pregnancy  in third trimester S/p IV Venofer  on 02/05/23, plan to recheck CBC at next visit  Lab Results  Component Value Date   HGB 8.3 (L) 01/05/2023    4. ASCUS with positive high risk HPV cervical Needs colpo PP  5. Yeast vaginitis Treated on 01/30/23.  Will treat with Diflucan. Instructed to call or come in if not going away or worsening.  - fluconazole (DIFLUCAN) 150 MG tablet; Take 1 tablet (150 mg total) by mouth once for 1 dose.  Dispense: 1 tablet; Refill: 0    Preterm labor symptoms and general obstetric precautions including but not limited to vaginal bleeding, contractions, leaking of fluid and fetal movement were reviewed in detail with the patient. Please refer to After Visit Summary for other counseling recommendations.   Return in 2 weeks for Mom+Baby Combined Care Future Appointments  Date Time Provider [place]  03/01/2023  1:35 PM Clarnce Flock, MD [facility]  03/12/2023  3:15 PM Caren Macadam, MD [facility]  03/23/2023  9:55 AM Clarnce Flock, MD [facility]  03/30/2023  9:15 AM Caren Macadam, MD [facility]  04/06/2023  9:15 AM Clarnce Flock, MD [facility]  04/12/2023  1:15 PM Clarnce Flock, MD [facility]  04/12/2023  2:15 PM [facility] [place]. FNP

## 2023-03-01 ENCOUNTER — Other Ambulatory Visit: Payer: Self-pay

## 2023-03-01 ENCOUNTER — Ambulatory Visit (INDEPENDENT_AMBULATORY_CARE_PROVIDER_SITE_OTHER): Payer: Medicaid Other | Admitting: Family Medicine

## 2023-03-01 ENCOUNTER — Encounter: Payer: Self-pay | Admitting: Family Medicine

## 2023-03-01 VITALS — BP 115/79 | HR 102 | Wt 187.4 lb

## 2023-03-01 DIAGNOSIS — Z3480 Encounter for supervision of other normal pregnancy, unspecified trimester: Secondary | ICD-10-CM

## 2023-03-01 DIAGNOSIS — O98113 Syphilis complicating pregnancy, third trimester: Secondary | ICD-10-CM

## 2023-03-01 DIAGNOSIS — Z3A34 34 weeks gestation of pregnancy: Secondary | ICD-10-CM

## 2023-03-01 DIAGNOSIS — R87810 Cervical high risk human papillomavirus (HPV) DNA test positive: Secondary | ICD-10-CM

## 2023-03-01 DIAGNOSIS — O99013 Anemia complicating pregnancy, third trimester: Secondary | ICD-10-CM

## 2023-03-01 DIAGNOSIS — R87610 Atypical squamous cells of undetermined significance on cytologic smear of cervix (ASC-US): Secondary | ICD-10-CM

## 2023-03-01 NOTE — Patient Instructions (Addendum)
Considering Waterbirth? Guide for patients at [facility] [employer] [facility]) Why consider waterbirth? Gentle birth for babies  Less pain medicine used in labor  May allow for passive descent/less pushing  May reduce perineal tears  More mobility and instinctive maternal position changes  Increased maternal relaxation   Is waterbirth safe? What are the risks of infection, drowning or other complications? Infection:  Very low risk (3.7 % for tub vs 4.8% for bed)  7 in 8000 waterbirths with documented infection  Poorly cleaned equipment most common cause  Slightly lower group B strep transmission rate  Drowning  Maternal:  Very low risk  Related to seizures or fainting  Newborn:  Very low risk. No evidence of increased risk of respiratory problems in multiple large studies  Physiological protection from breathing under water  Avoid underwater birth if there are any fetal complications  Once baby's head is out of the water, keep it out.  Birth complication  Some reports of cord trauma, but risk decreased by bringing baby to surface gradually  No evidence of increased risk of shoulder dystocia. Mothers can usually change positions faster in water than in a bed, possibly aiding the maneuvers to free the shoulder.   There are 2 things you MUST do to have a waterbirth with [facility]: Attend a waterbirth class at [place] at [facility]   3rd Wednesday of every month from 7-9 pm (virtual during [place]) [employer] at [email or web address] or [email or web address] or by calling [phone] Bring [place] the certificate from the class to your prenatal appointment or send via [place] with a midwife at 36 weeks* to see if you can still plan a waterbirth and to sign the consent.   *We also recommend that you schedule as many of your prenatal visits with a midwife as possible.    Helpful information: You may want to bring a bathing suit top to the hospital  to wear during labor but this is optional.  All other supplies are provided by the hospital. Please arrive at the hospital with signs of active labor, and do not wait at home until late in labor. It takes 45 min- 1 hour for fetal monitoring, and check in to your room to take place, plus transport and filling of the waterbirth tub.    Things that would prevent you from having a waterbirth: Premature, <37wks  Previous cesarean birth  Presence of thick meconium-stained fluid  Multiple gestation (Twins, triplets, etc.)  Uncontrolled diabetes or gestational diabetes requiring medication  Hypertension diagnosed in pregnancy or preexisting hypertension (gestational hypertension, preeclampsia, or chronic hypertension) Fetal growth restriction (your baby measures less than 10th percentile on ultrasound) Heavy vaginal bleeding  Non-reassuring fetal heart rate  Active infection (MRSA, etc.). Group B Strep is NOT a contraindication for waterbirth.  If your labor has to be induced and induction method requires continuous monitoring of the baby's heart rate  Other risks/issues identified by your obstetrical provider   Please remember that birth is unpredictable. Under certain unforeseeable circumstances your provider may advise against giving birth in the tub. These decisions will be made on a case-by-case basis and with the safety of you and your baby as our highest priority.    Updated 03/08/22   Contraception Choices Contraception, also called birth control, refers to methods or devices that prevent pregnancy. Hormonal methods  Contraceptive implant A contraceptive implant is a thin, plastic tube that contains a hormone that prevents pregnancy. It is different from an intrauterine  device (IUD). It is inserted into the upper part of the arm by a health care provider. Implants can be effective for up to 3 years. Progestin-only injections Progestin-only injections are injections of progestin, a  synthetic form of the hormone progesterone. They are given every 3 months by a health care provider. Birth control pills Birth control pills are pills that contain hormones that prevent pregnancy. They must be taken once a day, preferably at the same time each day. A prescription is needed to use this method of contraception. Birth control patch The birth control patch contains hormones that prevent pregnancy. It is placed on the skin and must be changed once a week for three weeks and removed on the fourth week. A prescription is needed to use this method of contraception. Vaginal ring A vaginal ring contains hormones that prevent pregnancy. It is placed in the vagina for three weeks and removed on the fourth week. After that, the process is repeated with a new ring. A prescription is needed to use this method of contraception. Emergency contraceptive Emergency contraceptives prevent pregnancy after unprotected sex. They come in pill form and can be taken up to 5 days after sex. They work best the sooner they are taken after having sex. Most emergency contraceptives are available without a prescription. This method should not be used as your only form of birth control. Barrier methods  Female condom A female condom is a thin sheath that is worn over the penis during sex. Condoms keep sperm from going inside a woman's body. They can be used with a sperm-killing substance (spermicide) to increase their effectiveness. They should be thrown away after one use. Female condom A female condom is a soft, loose-fitting sheath that is put into the vagina before sex. The condom keeps sperm from going inside a woman's body. They should be thrown away after one use. Diaphragm A diaphragm is a soft, dome-shaped barrier. It is inserted into the vagina before sex, along with a spermicide. The diaphragm blocks sperm from entering the uterus, and the spermicide kills sperm. A diaphragm should be left in the vagina for  6-8 hours after sex and removed within 24 hours. A diaphragm is prescribed and fitted by a health care provider. A diaphragm should be replaced every 1-2 years, after giving birth, after gaining more than 15 lb (6.8 kg), and after pelvic surgery. Cervical cap A cervical cap is a round, soft latex or plastic cup that fits over the cervix. It is inserted into the vagina before sex, along with spermicide. It blocks sperm from entering the uterus. The cap should be left in place for 6-8 hours after sex and removed within 48 hours. A cervical cap must be prescribed and fitted by a health care provider. It should be replaced every 2 years. Sponge A sponge is a soft, circular piece of polyurethane foam with spermicide in it. The sponge helps block sperm from entering the uterus, and the spermicide kills sperm. To use it, you make it wet and then insert it into the vagina. It should be inserted before sex, left in for at least 6 hours after sex, and removed and thrown away within 30 hours. Spermicides Spermicides are chemicals that kill or block sperm from entering the cervix and uterus. They can come as a cream, jelly, suppository, foam, or tablet. A spermicide should be inserted into the vagina with an applicator at least [id number] minutes before sex to allow time for it to work. The process must  be repeated every time you have sex. Spermicides do not require a prescription. Intrauterine contraception Intrauterine device (IUD) An IUD is a T-shaped device that is put in a woman's uterus. There are two types: Hormone IUD.This type contains progestin, a synthetic form of the hormone progesterone. This type can stay in place for 3-5 years. Copper IUD.This type is wrapped in copper wire. It can stay in place for 10 years. Permanent methods of contraception Female tubal ligation In this method, a woman's fallopian tubes are sealed, tied, or blocked during surgery to prevent eggs from traveling to the  uterus. Hysteroscopic sterilization In this method, a small, flexible insert is placed into each fallopian tube. The inserts cause scar tissue to form in the fallopian tubes and block them, so sperm cannot reach an egg. The procedure takes about 3 months to be effective. Another form of birth control must be used during those 3 months. Female sterilization This is a procedure to tie off the tubes that carry sperm (vasectomy). After the procedure, the man can still ejaculate fluid (semen). Another form of birth control must be used for 3 months after the procedure. Natural planning methods Natural family planning In this method, a couple does not have sex on days when the woman could become pregnant. Calendar method In this method, the woman keeps track of the length of each menstrual cycle, identifies the days when pregnancy can happen, and does not have sex on those days. Ovulation method In this method, a couple avoids sex during ovulation. Symptothermal method This method involves not having sex during ovulation. The woman typically checks for ovulation by watching changes in her temperature and in the consistency of cervical mucus. Post-ovulation method In this method, a couple waits to have sex until after ovulation. Where to find more information Centers for Disease Control and Prevention: http://www.wolf.info/ Summary Contraception, also called birth control, refers to methods or devices that prevent pregnancy. Hormonal methods of contraception include implants, injections, pills, patches, vaginal rings, and emergency contraceptives. Barrier methods of contraception can include female condoms, female condoms, diaphragms, cervical caps, sponges, and spermicides. There are two types of IUDs (intrauterine devices). An IUD can be put in a woman's uterus to prevent pregnancy for 3-5 years. Permanent sterilization can be done through a procedure for males and females. Natural family planning methods  involve nothaving sex on days when the woman could become pregnant. This information is not intended to replace advice given to you by your health care provider. Make sure you discuss any questions you have with your health care provider. Document Revised: 04/26/2020 Document Reviewed: 04/26/2020 Elsevier Patient Education  [employer].

## 2023-03-01 NOTE — Progress Notes (Signed)
   Subjective:  Darlene Ortiz is a 27 y.o. G2P0010 at [redacted]w[redacted]d being seen today for ongoing prenatal care.  She is currently monitored for the following issues for this low-risk pregnancy and has Supervision of other normal pregnancy, antepartum; Syphilis affecting pregnancy; ASCUS with positive high risk HPV cervical; Anemia of pregnancy; and Orbit fracture, right, closed, initial encounter [facility]) on their problem list.  Patient reports no complaints.  Contractions: Not present. Vag. Bleeding: None.  Movement: Present. Denies leaking of fluid.   The following portions of the patient's history were reviewed and updated as appropriate: allergies, current medications, past family history, past medical history, past social history, past surgical history and problem list. Problem list updated.  Objective:   Vitals:   03/01/23 1347  BP: 115/79  Pulse: (!) 102  Weight: 187 lb 6.4 oz (85 kg)    Fetal Status: Fetal Heart Rate (bpm): 134 Fundal Height: 36 cm Movement: Present     General:  Alert, oriented and cooperative. Patient is in no acute distress.  Skin: Skin is warm and dry. No rash noted.   Cardiovascular: Normal heart rate noted  Respiratory: Normal respiratory effort, no problems with respiration noted  Abdomen: Soft, gravid, appropriate for gestational age. Pain/Pressure: Absent     Pelvic: Vag. Bleeding: None     Cervical exam deferred        Extremities: Normal range of motion.  Edema: None  Mental Status: Normal mood and affect. Normal behavior. Normal judgment and thought content.   Urinalysis:      Assessment and Plan:  Pregnancy: G2P0010 at [redacted]w[redacted]d  1. Supervision of other normal pregnancy, antepartum BP and FHR normal Interested in water birth, aware of class and need to see water birth provider. Scheduled for Dr. Ernestina Patches next visit  2. Syphilis affecting pregnancy in third trimester Falling titers, recheck at next visit  3. ASCUS with positive high risk HPV cervical Needs  colpo PP  4. Anemia during pregnancy in third trimester Lab Results  Component Value Date   HGB 8.3 (L) 01/05/2023   S/p iv iron x2, check CBC at next visit  Preterm labor symptoms and general obstetric precautions including but not limited to vaginal bleeding, contractions, leaking of fluid and fetal movement were reviewed in detail with the patient. Please refer to After Visit Summary for other counseling recommendations.  Return in 2 weeks (on 03/15/2023) for Dyad patient, ob visit.   Clarnce Flock, MD

## 2023-03-12 ENCOUNTER — Other Ambulatory Visit: Payer: Self-pay

## 2023-03-12 ENCOUNTER — Ambulatory Visit (INDEPENDENT_AMBULATORY_CARE_PROVIDER_SITE_OTHER): Payer: Medicaid Other | Admitting: Family Medicine

## 2023-03-12 ENCOUNTER — Other Ambulatory Visit (HOSPITAL_COMMUNITY)
Admission: RE | Admit: 2023-03-12 | Discharge: 2023-03-12 | Disposition: A | Discharge status: Home / Self Care | Payer: Medicaid Other | Source: Ambulatory Visit | Attending: Family Medicine | Admitting: Family Medicine

## 2023-03-12 VITALS — BP 122/85 | HR 87 | Wt 190.0 lb

## 2023-03-12 DIAGNOSIS — Z3A36 36 weeks gestation of pregnancy: Secondary | ICD-10-CM

## 2023-03-12 DIAGNOSIS — Z3480 Encounter for supervision of other normal pregnancy, unspecified trimester: Secondary | ICD-10-CM | POA: Diagnosis present

## 2023-03-12 DIAGNOSIS — O98113 Syphilis complicating pregnancy, third trimester: Secondary | ICD-10-CM

## 2023-03-12 DIAGNOSIS — O99013 Anemia complicating pregnancy, third trimester: Secondary | ICD-10-CM

## 2023-03-12 NOTE — Progress Notes (Signed)
   PRENATAL VISIT NOTE  Subjective:  Darlene Ortiz is a 27 y.o. G2P0010 at [redacted]w[redacted]d being seen today for ongoing prenatal care.  She is currently monitored for the following issues for this low-risk pregnancy and has Supervision of other normal pregnancy, antepartum; Syphilis affecting pregnancy; ASCUS with positive high risk HPV cervical; Anemia of pregnancy; and Orbit fracture, right, closed, initial encounter on their problem list.  Patient reports no complaints.  Contractions: Not present. Vag. Bleeding: None.  Movement: Present. Denies leaking of fluid.   The following portions of the patient's history were reviewed and updated as appropriate: allergies, current medications, past family history, past medical history, past social history, past surgical history and problem list.   Objective:   Vitals:   03/12/23 1549  BP: 122/85  Pulse: 87  Weight: 190 lb (86.2 kg)    Fetal Status: Fetal Heart Rate (bpm): 138 Fundal Height: 37 cm Movement: Present  Presentation: Vertex  General:  Alert, oriented and cooperative. Patient is in no acute distress.  Skin: Skin is warm and dry. No rash noted.   Cardiovascular: Normal heart rate noted  Respiratory: Normal respiratory effort, no problems with respiration noted  Abdomen: Soft, gravid, appropriate for gestational age.  Pain/Pressure: Absent     Pelvic: Cervical exam deferred        Extremities: Normal range of motion.  Edema: None  Mental Status: Normal mood and affect. Normal behavior. Normal judgment and thought content.   Assessment and Plan:  Pregnancy: G2P0010 at [redacted]w[redacted]d 1. Supervision of other normal pregnancy, antepartum - TWG=51 lb (23.1 kg)  - Pt is interested in waterbirth.  No contraindications at this time per chart review/patient assessment.   - Completed class on 4/4-- shown certificate in the room. Patient to send via mychart.  - Recommended "natural" labor class at hospital to help with coping with labor prior to water  immersion.  - Discussed waterbirth as option for low-risk pregnancy.  Reviewed conditions that may arise during pregnancy that will risk pt out of waterbirth including hypertension, diabetes, fetal growth restriction <10%ile, etc.  - Culture, beta strep (group b only) - GC/Chlamydia probe amp ([place])not at [facility]  2. Syphilis affecting pregnancy in third trimester Treated PCN x 3 RPR decreased 1:4--> 1:2 which is appropriate after treatment. Will have repeat at delivery but titer might still be positive and Tpal abx will always remain positive. She is considered treated and thus safe for water birth.    Preterm labor symptoms and general obstetric precautions including but not limited to vaginal bleeding, contractions, leaking of fluid and fetal movement were reviewed in detail with the patient. Please refer to After Visit Summary for other counseling recommendations.   No follow-ups on file.  Future Appointments  Date Time Provider Department Center  03/23/2023  9:55 AM Venora Maples, MD [facility]  03/30/2023  9:15 AM Federico Flake, MD [facility]  04/06/2023  9:15 AM Venora Maples, MD [facility]  04/12/2023  1:15 PM Venora Maples, MD [facility]  04/12/2023  2:15 PM [facility]    Federico Flake, MD

## 2023-03-13 LAB — GC/CHLAMYDIA PROBE AMP (~~LOC~~) NOT AT ARMC
Chlamydia: NEGATIVE
Comment: NEGATIVE
Comment: NORMAL
Neisseria Gonorrhea: NEGATIVE

## 2023-03-13 LAB — CBC
Hematocrit: 33.6 % — ABNORMAL LOW (ref 34.0–46.6)
Hemoglobin: 10.8 g/dL — ABNORMAL LOW (ref 11.1–15.9)
MCH: 26.9 pg (ref 26.6–33.0)
MCHC: 32.1 g/dL (ref 31.5–35.7)
MCV: 84 fL (ref 79–97)
Platelets: 294 10*3/uL (ref 150–450)
RBC: 4.02 x10E6/uL (ref 3.77–5.28)
RDW: 19.9 % — ABNORMAL HIGH (ref 11.7–15.4)
WBC: 8.2 10*3/uL (ref 3.4–10.8)

## 2023-03-13 LAB — IRON,TIBC AND FERRITIN PANEL
Ferritin: 29 ng/mL (ref 15–150)
Iron Saturation: 10 % — ABNORMAL LOW (ref 15–55)
Iron: 58 ug/dL (ref 27–159)
Total Iron Binding Capacity: 597 ug/dL (ref 250–450)
UIBC: 539 ug/dL — ABNORMAL HIGH (ref 131–425)

## 2023-03-15 LAB — CULTURE, BETA STREP (GROUP B ONLY): Strep Gp B Culture: POSITIVE — AB

## 2023-03-19 ENCOUNTER — Encounter: Payer: Self-pay | Admitting: Family Medicine

## 2023-03-19 DIAGNOSIS — O99820 Streptococcus B carrier state complicating pregnancy: Secondary | ICD-10-CM | POA: Insufficient documentation

## 2023-03-23 ENCOUNTER — Encounter: Payer: Self-pay | Admitting: Family Medicine

## 2023-03-23 ENCOUNTER — Ambulatory Visit (INDEPENDENT_AMBULATORY_CARE_PROVIDER_SITE_OTHER): Payer: Medicaid Other | Admitting: Family Medicine

## 2023-03-23 VITALS — BP 117/79 | HR 90 | Wt 193.4 lb

## 2023-03-23 DIAGNOSIS — O99820 Streptococcus B carrier state complicating pregnancy: Secondary | ICD-10-CM

## 2023-03-23 DIAGNOSIS — O98113 Syphilis complicating pregnancy, third trimester: Secondary | ICD-10-CM

## 2023-03-23 DIAGNOSIS — Z3A37 37 weeks gestation of pregnancy: Secondary | ICD-10-CM

## 2023-03-23 DIAGNOSIS — R87610 Atypical squamous cells of undetermined significance on cytologic smear of cervix (ASC-US): Secondary | ICD-10-CM

## 2023-03-23 DIAGNOSIS — Z3480 Encounter for supervision of other normal pregnancy, unspecified trimester: Secondary | ICD-10-CM

## 2023-03-23 DIAGNOSIS — R87810 Cervical high risk human papillomavirus (HPV) DNA test positive: Secondary | ICD-10-CM

## 2023-03-23 NOTE — Patient Instructions (Signed)

## 2023-03-23 NOTE — Progress Notes (Signed)
   Subjective:  Darlene Ortiz is a 27 y.o. G2P0010 at [redacted]w[redacted]d being seen today for ongoing prenatal care.  She is currently monitored for the following issues for this high-risk pregnancy and has Supervision of other normal pregnancy, antepartum; Syphilis affecting pregnancy; ASCUS with positive high risk HPV cervical; Anemia of pregnancy; Orbit fracture, right, closed, initial encounter; and GBS (group B Streptococcus carrier), +RV culture, currently pregnant on their problem list.  Patient reports no complaints.  Contractions: Not present. Vag. Bleeding: None.  Movement: Present. Denies leaking of fluid.   The following portions of the patient's history were reviewed and updated as appropriate: allergies, current medications, past family history, past medical history, past social history, past surgical history and problem list. Problem list updated.  Objective:   Vitals:   03/23/23 1027  BP: 117/79  Pulse: 90  Weight: 193 lb 6.4 oz (87.7 kg)    Fetal Status: Fetal Heart Rate (bpm): 137 Fundal Height: 38 cm Movement: Present     General:  Alert, oriented and cooperative. Patient is in no acute distress.  Skin: Skin is warm and dry. No rash noted.   Cardiovascular: Normal heart rate noted  Respiratory: Normal respiratory effort, no problems with respiration noted  Abdomen: Soft, gravid, appropriate for gestational age. Pain/Pressure: Absent     Pelvic: Vag. Bleeding: None     Cervical exam deferred        Extremities: Normal range of motion.  Edema: None  Mental Status: Normal mood and affect. Normal behavior. Normal judgment and thought content.   Urinalysis:      Assessment and Plan:  Pregnancy: G2P0010 at [redacted]w[redacted]d  1. Supervision of other normal pregnancy, antepartum BP and FHR normal Discussed natural labor IOL methods  2. Syphilis affecting pregnancy in third trimester Falling titers c/w treatment  3. GBS (group B Streptococcus carrier), +RV culture, currently pregnant Ppx in  labor  4. ASCUS with positive high risk HPV cervical Pp colpo  Term labor symptoms and general obstetric precautions including but not limited to vaginal bleeding, contractions, leaking of fluid and fetal movement were reviewed in detail with the patient. Please refer to After Visit Summary for other counseling recommendations.  Return in 1 week (on 03/30/2023) for Dyad patient, ob visit.   Venora Maples, MD

## 2023-03-30 ENCOUNTER — Other Ambulatory Visit: Payer: Self-pay

## 2023-03-30 ENCOUNTER — Ambulatory Visit (INDEPENDENT_AMBULATORY_CARE_PROVIDER_SITE_OTHER): Payer: Medicaid Other | Admitting: Family Medicine

## 2023-03-30 VITALS — BP 126/81 | HR 96 | Wt 197.6 lb

## 2023-03-30 DIAGNOSIS — Z3A38 38 weeks gestation of pregnancy: Secondary | ICD-10-CM

## 2023-03-30 DIAGNOSIS — O98113 Syphilis complicating pregnancy, third trimester: Secondary | ICD-10-CM

## 2023-03-30 DIAGNOSIS — O99820 Streptococcus B carrier state complicating pregnancy: Secondary | ICD-10-CM

## 2023-03-30 DIAGNOSIS — Z3480 Encounter for supervision of other normal pregnancy, unspecified trimester: Secondary | ICD-10-CM

## 2023-03-30 DIAGNOSIS — O99013 Anemia complicating pregnancy, third trimester: Secondary | ICD-10-CM

## 2023-03-30 NOTE — Progress Notes (Signed)
   PRENATAL VISIT NOTE  Subjective:  Darlene Ortiz is a 27 y.o. G2P0010 at [redacted]w[redacted]d being seen today for ongoing prenatal care.  She is currently monitored for the following issues for this low-risk pregnancy and has Supervision of other normal pregnancy, antepartum; Syphilis affecting pregnancy; ASCUS with positive high risk HPV cervical; Anemia of pregnancy; Orbit fracture, right, closed, initial encounter ([facility]); and GBS (group B Streptococcus carrier), +RV culture, currently pregnant on their problem list.  Patient reports no complaints.  Contractions: Not present. Vag. Bleeding: None.  Movement: Present. Denies leaking of fluid.   The following portions of the patient's history were reviewed and updated as appropriate: allergies, current medications, past family history, past medical history, past social history, past surgical history and problem list.   Objective:   Vitals:   03/30/23 0938  BP: 126/81  Pulse: 96  Weight: 197 lb 9.6 oz (89.6 kg)    Fetal Status: Fetal Heart Rate (bpm): 133 Fundal Height: 38 cm Movement: Present     General:  Alert, oriented and cooperative. Patient is in no acute distress.  Skin: Skin is warm and dry. No rash noted.   Cardiovascular: Normal heart rate noted  Respiratory: Normal respiratory effort, no problems with respiration noted  Abdomen: Soft, gravid, appropriate for gestational age.  Pain/Pressure: Absent     Pelvic: Cervical exam deferred        Extremities: Normal range of motion.     Mental Status: Normal mood and affect. Normal behavior. Normal judgment and thought content.   Assessment and Plan:  Pregnancy: G2P0010 at [redacted]w[redacted]d 1. Anemia during pregnancy in third trimester Lab Results  Component Value Date   HGB 10.8 (L) 03/12/2023   HGB 8.3 (L) 01/05/2023   HGB 10.7 (L) 11/01/2022   Improved with Fe treatment, received venofer Continue oral Fe   2. Supervision of other normal pregnancy, antepartum Vigorous labor Fh appropriate Does  not want cervical exam Discussed what to expect in labor Reviewed plan for WB, patient has taken class and is aware of reason she might not be able to birth in water  Discussed membrane sweeping at next visit only if desired. Discussed r/b of procedure in detail.   3. Syphilis affecting pregnancy in third trimester Treated PCNx3, appropriate decrease in titer from 1:4 to 1:12 in less than 6 months Patient asked today if infant would be test, discussed that likely not since infection was treated early and that [name] has responded to that treatment. Pediatric team in the hospital will discuss more with her after delivery.   4. GBS (group B Streptococcus carrier), +RV culture, currently pregnant PCN in labor  Preterm labor symptoms and general obstetric precautions including but not limited to vaginal bleeding, contractions, leaking of fluid and fetal movement were reviewed in detail with the patient. Please refer to After Visit Summary for other counseling recommendations.   Return in about 1 week (around 04/06/2023) for Mom+Baby Combined Care.  Future Appointments  Date Time Provider Department Center  04/06/2023  9:15 AM Venora Maples, MD [facility]  04/12/2023  1:15 PM Venora Maples, MD [facility]  04/12/2023  2:15 PM [facility]    Federico Flake, MD

## 2023-04-05 ENCOUNTER — Encounter (HOSPITAL_COMMUNITY): Payer: Self-pay | Admitting: Obstetrics & Gynecology

## 2023-04-05 ENCOUNTER — Inpatient Hospital Stay (HOSPITAL_COMMUNITY)
Admission: AD | Admit: 2023-04-05 | Discharge: 2023-04-05 | Disposition: A | Discharge status: Home / Self Care | Payer: Medicaid Other | Attending: Obstetrics & Gynecology | Admitting: Obstetrics & Gynecology

## 2023-04-05 DIAGNOSIS — Z3A39 39 weeks gestation of pregnancy: Secondary | ICD-10-CM | POA: Insufficient documentation

## 2023-04-05 DIAGNOSIS — N930 Postcoital and contact bleeding: Secondary | ICD-10-CM | POA: Diagnosis not present

## 2023-04-05 DIAGNOSIS — O208 Other hemorrhage in early pregnancy: Secondary | ICD-10-CM | POA: Diagnosis present

## 2023-04-05 DIAGNOSIS — O479 False labor, unspecified: Secondary | ICD-10-CM | POA: Diagnosis not present

## 2023-04-05 NOTE — MAU Provider Note (Signed)
Event Date/Time   First Provider Initiated Contact with Patient 04/05/23 1340     S: [name] is a 27 y.o. G2P0010 at [redacted]w[redacted]d  who presents to MAU today complaining of post coital bleeding. She reports she had intercourse this morning and saw spotting after. She reports it is now brown in color. She reports intermittent contractions. She denies LOF. She reports normal fetal movement.    O: BP 112/71   Pulse (!) 101   Temp 97.8 F (36.6 C) (Oral)   Resp 18   Ht 5\' 8"  (1.727 m)   Wt 88.5 kg   LMP 06/26/2022 (Exact Date)   SpO2 99%   BMI 29.65 kg/m  GENERAL: Well-developed, well-nourished female in no acute distress.  HEAD: Normocephalic, atraumatic.  CHEST: Normal effort of breathing, regular heart rate ABDOMEN: Soft, nontender, gravid Pelvis: no bleeding noted  Cervical exam:  Dilation: Fingertip Effacement (%): Thick Station: Ballotable Presentation: Undeterminable Exam by:: Henrine Screws RN  Fetal Monitoring: Baseline: 120 Variability: moderate Accelerations: 15x15 Decelerations: none Contractions: 4-7   A: 1. PCB (post coital bleeding)   2. [redacted] weeks gestation of pregnancy      P: -Discharge home in stable condition -Labor precautions discussed -Patient advised to follow-up with OB as scheduled for prenatal care -Patient may return to MAU as needed or if her condition were to change or worsen   Rolm Bookbinder, [place] 04/05/2023 1:40 PM

## 2023-04-05 NOTE — MAU Note (Signed)
.  Darlene Ortiz is a 27 y.o. at [redacted]w[redacted]d here in MAU reporting: vaginal bleeding starting today about 2 hours ago patient denies wearing a pad and reports no clots in blood. Brownish colored blood noted on patients underwear and lower back pain that is constant since last night.  Patient reports had sexual intercourse today before bleeding began.  Denies LOF and reports +FM.  LMP: n/a Onset of complaint: yesterday Pain score: lower back pain -8/10  Vitals:   04/05/23 1330  BP: 127/75  Pulse: 92  Resp: 18  Temp: 97.8 F (36.6 C)  SpO2: 100%     FHT:130 Lab orders placed from triage:  n/a

## 2023-04-05 NOTE — Discharge Instructions (Signed)

## 2023-04-06 ENCOUNTER — Encounter (HOSPITAL_COMMUNITY): Payer: Self-pay | Admitting: Obstetrics and Gynecology

## 2023-04-06 ENCOUNTER — Encounter: Payer: Medicaid Other | Admitting: Family Medicine

## 2023-04-06 ENCOUNTER — Inpatient Hospital Stay (HOSPITAL_COMMUNITY)
Admission: AD | Admit: 2023-04-06 | Discharge: 2023-04-06 | Disposition: A | Discharge status: Home / Self Care | Payer: Medicaid Other | Attending: Obstetrics and Gynecology | Admitting: Obstetrics and Gynecology

## 2023-04-06 DIAGNOSIS — Z3A39 39 weeks gestation of pregnancy: Secondary | ICD-10-CM

## 2023-04-06 DIAGNOSIS — Z3689 Encounter for other specified antenatal screening: Secondary | ICD-10-CM | POA: Diagnosis not present

## 2023-04-06 DIAGNOSIS — O99820 Streptococcus B carrier state complicating pregnancy: Secondary | ICD-10-CM

## 2023-04-06 DIAGNOSIS — O479 False labor, unspecified: Secondary | ICD-10-CM

## 2023-04-06 DIAGNOSIS — Z3480 Encounter for supervision of other normal pregnancy, unspecified trimester: Secondary | ICD-10-CM

## 2023-04-06 DIAGNOSIS — O471 False labor at or after 37 completed weeks of gestation: Secondary | ICD-10-CM | POA: Diagnosis not present

## 2023-04-06 NOTE — MAU Provider Note (Signed)
Per RN: [name] is a 27 y.o. G2P0010 at [redacted]w[redacted]d  who presents to MAU today complaining contractions since 0348. No VB or LOF. +FM.   O: BP 130/73   Pulse 91   Temp 97.8 F (36.6 C)   Resp 18   Ht 5\' 8"  (1.727 m)   Wt 90.3 kg   LMP 06/26/2022 (Exact Date)   SpO2 100%   BMI 30.26 kg/m   Cervical exam:  Dilation: 1.5 Effacement (%): 60 Cervical Position: Posterior Station: -2 Presentation: Vertex Exam by:: Holly Flippin RN  Fetal Monitoring: Baseline: 125 Variability: mod Accelerations: + Decelerations: no Contractions: irreg  MDM: no cervical change on recheck, ctx irreg.   A: SIUP at [redacted]w[redacted]d  False labor Reactive NST  P: Discharge home Follow up @[facility]  as scheduled Labor precautions  Donette Larry, CNM 04/06/2023 8:36 AM

## 2023-04-06 NOTE — MAU Note (Addendum)
.  Darlene Ortiz is a 27 y.o. at [redacted]w[redacted]d here in MAU reporting ctxs since 0348. Was here yesterday and 0.5cm. Reports good FM, no LOF and some bloody show. Pt desires water birth  Onset of complaint: 0348 Pain score: 8 Vitals:   04/06/23 0652 04/06/23 0656  BP:  139/80  Pulse: 90   Resp: 18   Temp: 97.8 F (36.6 C)   SpO2: 100%      FHT:138 Lab orders placed from triage:  mau labor eval

## 2023-04-06 NOTE — Discharge Instructions (Signed)
The [employer] This circuit takes at least 90 minutes to complete so clear your schedule and make mental preparations so you can relax in your environment.  The second step requires a lot of pillows so gather them up before beginning.  Before starting, you should empty your bladder! Have a nice drink nearby, and make sure it has a straw! If you are having contractions, this circuit should be done through contractions, try not to change positions between steps.  Step One: Open-kneeChest Stay in this position for 30 minutes, start in cat/cow, then drop your chest as low as you can to the bed or the floor and your bottom as high as you can. Knees should be fairly wide apart, and the angle between the torso/thighs should be wider than 90 degrees. Wiggle around, prop with lots of pillows and use this time to get totally relaxed. This position allows the baby to scoot out of the pelvis a bit and gives them room to rotate, shift their head position, etc. If the pregnant person finds it helpful, careful positioning with a rebozo under the belly, with gentle tension from a support person behind can help maintain this position for the full 30 minutes.  Step Two:ExaggeratedLeft SideLying Roll to your left side, bringing your top leg as high as possible and keeping your bottom leg straight. Roll forward as much as possible, again using a lot of pillows. Sink into the bed and relax some more. If you fall asleep, that's totally okay and you can stay there! If not, stay here for at least another half an hour. Try and get your top right leg up towards your head and get as rolled over onto your belly as much as possible. If you repeat the circuit during labor, try alternating left and right sides. We know the photo the left is actually right side... just flip the image in your head.  Step Three: Moving and Lunges Lunge, walk stairs facing sideways, 2 at a time, (have a spotter  downstairs of you!), take a walk outside with one foot on the curb and the other on the street, sit on a birth ball and hula- anything that's upright and putting your pelvis in open, asymmetrical positions. Spend at least 30 minutes doing this one as well to give your baby a chance to move down. If you are lunging or stair or curb walking, you should lunge/walk/go up stairs in the direction that feels better to you. The key with the lunge is that the toes of the higher leg and mom's belly button should be at right angles. Do not lunge over your knee, that closes the pelvis.  You got this!!!!!!!!!!!!!!!!!!!!!!! :)  

## 2023-04-07 ENCOUNTER — Inpatient Hospital Stay (HOSPITAL_COMMUNITY): Payer: Medicaid Other | Admitting: Anesthesiology

## 2023-04-07 ENCOUNTER — Encounter (HOSPITAL_COMMUNITY): Payer: Self-pay | Admitting: Obstetrics & Gynecology

## 2023-04-07 ENCOUNTER — Encounter: Payer: Self-pay | Admitting: Family Medicine

## 2023-04-07 ENCOUNTER — Inpatient Hospital Stay (HOSPITAL_COMMUNITY)
Admission: AD | Admit: 2023-04-07 | Discharge: 2023-04-09 | DRG: 807 | Disposition: A | Discharge status: Home / Self Care | Payer: Medicaid Other | Attending: Obstetrics & Gynecology | Admitting: Obstetrics & Gynecology

## 2023-04-07 DIAGNOSIS — K5900 Constipation, unspecified: Secondary | ICD-10-CM

## 2023-04-07 DIAGNOSIS — O99019 Anemia complicating pregnancy, unspecified trimester: Secondary | ICD-10-CM | POA: Diagnosis present

## 2023-04-07 DIAGNOSIS — Z3A39 39 weeks gestation of pregnancy: Secondary | ICD-10-CM

## 2023-04-07 DIAGNOSIS — O98119 Syphilis complicating pregnancy, unspecified trimester: Secondary | ICD-10-CM | POA: Diagnosis present

## 2023-04-07 DIAGNOSIS — O165 Unspecified maternal hypertension, complicating the puerperium: Secondary | ICD-10-CM | POA: Diagnosis not present

## 2023-04-07 DIAGNOSIS — O9812 Syphilis complicating childbirth: Secondary | ICD-10-CM | POA: Diagnosis not present

## 2023-04-07 DIAGNOSIS — O99824 Streptococcus B carrier state complicating childbirth: Secondary | ICD-10-CM | POA: Diagnosis present

## 2023-04-07 DIAGNOSIS — O99013 Anemia complicating pregnancy, third trimester: Secondary | ICD-10-CM | POA: Diagnosis present

## 2023-04-07 DIAGNOSIS — O9902 Anemia complicating childbirth: Principal | ICD-10-CM | POA: Diagnosis present

## 2023-04-07 DIAGNOSIS — R030 Elevated blood-pressure reading, without diagnosis of hypertension: Secondary | ICD-10-CM | POA: Diagnosis present

## 2023-04-07 DIAGNOSIS — O4202 Full-term premature rupture of membranes, onset of labor within 24 hours of rupture: Secondary | ICD-10-CM | POA: Diagnosis not present

## 2023-04-07 DIAGNOSIS — O99892 Other specified diseases and conditions complicating childbirth: Secondary | ICD-10-CM | POA: Diagnosis present

## 2023-04-07 DIAGNOSIS — O99820 Streptococcus B carrier state complicating pregnancy: Secondary | ICD-10-CM | POA: Diagnosis not present

## 2023-04-07 DIAGNOSIS — R87810 Cervical high risk human papillomavirus (HPV) DNA test positive: Secondary | ICD-10-CM | POA: Diagnosis present

## 2023-04-07 DIAGNOSIS — O26893 Other specified pregnancy related conditions, third trimester: Secondary | ICD-10-CM | POA: Diagnosis present

## 2023-04-07 DIAGNOSIS — R87610 Atypical squamous cells of undetermined significance on cytologic smear of cervix (ASC-US): Secondary | ICD-10-CM | POA: Diagnosis present

## 2023-04-07 DIAGNOSIS — Z3480 Encounter for supervision of other normal pregnancy, unspecified trimester: Secondary | ICD-10-CM

## 2023-04-07 LAB — TYPE AND SCREEN
ABO/RH(D): A POS
Antibody Screen: NEGATIVE

## 2023-04-07 LAB — RPR
RPR Ser Ql: REACTIVE — AB
RPR Titer: 1:2 {titer}

## 2023-04-07 LAB — CBC
HCT: 34.4 % — ABNORMAL LOW (ref 36.0–46.0)
Hemoglobin: 11.2 g/dL — ABNORMAL LOW (ref 12.0–15.0)
MCH: 27.1 pg (ref 26.0–34.0)
MCHC: 32.6 g/dL (ref 30.0–36.0)
MCV: 83.3 fL (ref 80.0–100.0)
Platelets: 270 10*3/uL (ref 150–400)
RBC: 4.13 MIL/uL (ref 3.87–5.11)
RDW: 17.5 % — ABNORMAL HIGH (ref 11.5–15.5)
WBC: 13.3 10*3/uL — ABNORMAL HIGH (ref 4.0–10.5)
nRBC: 0 % (ref 0.0–0.2)

## 2023-04-07 MED ORDER — PRENATAL MULTIVITAMIN CH
1.0000 | ORAL_TABLET | Freq: Every day | ORAL | Status: DC
  Administered 2023-04-07 – 2023-04-09 (×3): 1 via ORAL
  Filled 2023-04-07 (×3): qty 1

## 2023-04-07 MED ORDER — SOD CITRATE-CITRIC ACID 500-334 MG/5ML PO SOLN: 30.0000 mL | ORAL | Status: DC | PRN

## 2023-04-07 MED ORDER — PHENYLEPHRINE 80 MCG/ML (10ML) SYRINGE FOR IV PUSH (FOR BLOOD PRESSURE SUPPORT): 80.0000 ug | PREFILLED_SYRINGE | INTRAVENOUS | Status: DC | PRN

## 2023-04-07 MED ORDER — DIPHENHYDRAMINE HCL 25 MG PO CAPS: 25.0000 mg | ORAL_CAPSULE | Freq: Four times a day (QID) | ORAL | Status: DC | PRN

## 2023-04-07 MED ORDER — IBUPROFEN 600 MG PO TABS
600.0000 mg | ORAL_TABLET | Freq: Four times a day (QID) | ORAL | Status: DC
  Administered 2023-04-07 – 2023-04-09 (×9): 600 mg via ORAL
  Filled 2023-04-07 (×9): qty 1

## 2023-04-07 MED ORDER — SIMETHICONE 80 MG PO CHEW: 80.0000 mg | CHEWABLE_TABLET | ORAL | Status: DC | PRN

## 2023-04-07 MED ORDER — LACTATED RINGERS IV SOLN: 500.0000 mL | Freq: Once | INTRAVENOUS | Status: DC

## 2023-04-07 MED ORDER — LIDOCAINE HCL (PF) 1 % IJ SOLN: INTRAMUSCULAR | Status: DC | PRN

## 2023-04-07 MED ORDER — FENTANYL CITRATE (PF) 100 MCG/2ML IJ SOLN
INTRAMUSCULAR | Status: AC
  Administered 2023-04-07: 50 ug
  Filled 2023-04-07: qty 2

## 2023-04-07 MED ORDER — BUPIVACAINE HCL (PF) 0.25 % IJ SOLN
INTRAMUSCULAR | Status: DC | PRN
  Administered 2023-04-07: 1.2 mL via INTRATHECAL

## 2023-04-07 MED ORDER — OXYTOCIN-SODIUM CHLORIDE 30-0.9 UT/500ML-% IV SOLN
INTRAVENOUS | Status: AC
  Filled 2023-04-07: qty 500

## 2023-04-07 MED ORDER — WITCH HAZEL-GLYCERIN EX PADS: 1.0000 | MEDICATED_PAD | CUTANEOUS | Status: DC | PRN

## 2023-04-07 MED ORDER — ONDANSETRON HCL 4 MG/2ML IJ SOLN: 4.0000 mg | INTRAMUSCULAR | Status: DC | PRN

## 2023-04-07 MED ORDER — OXYCODONE-ACETAMINOPHEN 5-325 MG PO TABS: 1.0000 | ORAL_TABLET | ORAL | Status: DC | PRN

## 2023-04-07 MED ORDER — ZOLPIDEM TARTRATE 5 MG PO TABS: 5.0000 mg | ORAL_TABLET | Freq: Every evening | ORAL | Status: DC | PRN

## 2023-04-07 MED ORDER — SENNOSIDES-DOCUSATE SODIUM 8.6-50 MG PO TABS
2.0000 | ORAL_TABLET | ORAL | Status: DC
  Administered 2023-04-07 – 2023-04-09 (×3): 2 via ORAL
  Filled 2023-04-07 (×3): qty 2

## 2023-04-07 MED ORDER — LIDOCAINE HCL (PF) 1 % IJ SOLN: 30.0000 mL | INTRAMUSCULAR | Status: DC | PRN

## 2023-04-07 MED ORDER — COCONUT OIL OIL: 1.0000 | TOPICAL_OIL | Status: DC | PRN

## 2023-04-07 MED ORDER — DIPHENHYDRAMINE HCL 50 MG/ML IJ SOLN: 12.5000 mg | INTRAMUSCULAR | Status: DC | PRN

## 2023-04-07 MED ORDER — FENTANYL-BUPIVACAINE-NACL 0.5-0.125-0.9 MG/250ML-% EP SOLN: 12.0000 mL/h | EPIDURAL | Status: DC | PRN

## 2023-04-07 MED ORDER — EPHEDRINE 5 MG/ML INJ: 10.0000 mg | INTRAVENOUS | Status: DC | PRN

## 2023-04-07 MED ORDER — ACETAMINOPHEN 325 MG PO TABS: 650.0000 mg | ORAL_TABLET | ORAL | Status: DC | PRN

## 2023-04-07 MED ORDER — LACTATED RINGERS IV SOLN: INTRAVENOUS | Status: DC

## 2023-04-07 MED ORDER — FENTANYL CITRATE (PF) 100 MCG/2ML IJ SOLN: 50.0000 ug | Freq: Once | INTRAMUSCULAR | Status: DC

## 2023-04-07 MED ORDER — OXYTOCIN-SODIUM CHLORIDE 30-0.9 UT/500ML-% IV SOLN: 2.5000 [IU]/h | INTRAVENOUS | Status: DC

## 2023-04-07 MED ORDER — BENZOCAINE-MENTHOL 20-0.5 % EX AERO
1.0000 | INHALATION_SPRAY | CUTANEOUS | Status: DC | PRN
  Administered 2023-04-07: 1 via TOPICAL
  Filled 2023-04-07: qty 56

## 2023-04-07 MED ORDER — ONDANSETRON HCL 4 MG PO TABS: 4.0000 mg | ORAL_TABLET | ORAL | Status: DC | PRN

## 2023-04-07 MED ORDER — SODIUM CHLORIDE 0.9 % IV SOLN
2.0000 g | Freq: Once | INTRAVENOUS | Status: AC
  Administered 2023-04-07: 2 g via INTRAVENOUS
  Filled 2023-04-07: qty 2000

## 2023-04-07 MED ORDER — DIBUCAINE (PERIANAL) 1 % EX OINT: 1.0000 | TOPICAL_OINTMENT | CUTANEOUS | Status: DC | PRN

## 2023-04-07 MED ORDER — LACTATED RINGERS IV SOLN: 500.0000 mL | INTRAVENOUS | Status: DC | PRN

## 2023-04-07 MED ORDER — SODIUM CHLORIDE 0.9 % IV SOLN: 1.0000 g | INTRAVENOUS | Status: DC

## 2023-04-07 MED ORDER — FLEET ENEMA 7-19 GM/118ML RE ENEM: 1.0000 | ENEMA | RECTAL | Status: DC | PRN

## 2023-04-07 MED ORDER — ONDANSETRON HCL 4 MG/2ML IJ SOLN: 4.0000 mg | Freq: Four times a day (QID) | INTRAMUSCULAR | Status: DC | PRN

## 2023-04-07 MED ORDER — FENTANYL-BUPIVACAINE-NACL 0.5-0.125-0.9 MG/250ML-% EP SOLN
EPIDURAL | Status: DC | PRN
  Administered 2023-04-07: 12 mL/h via EPIDURAL

## 2023-04-07 MED ORDER — FENTANYL-BUPIVACAINE-NACL 0.5-0.125-0.9 MG/250ML-% EP SOLN
EPIDURAL | Status: AC
  Filled 2023-04-07: qty 250

## 2023-04-07 MED ORDER — OXYTOCIN BOLUS FROM INFUSION: 333.0000 mL | Freq: Once | INTRAVENOUS | Status: DC

## 2023-04-07 MED ORDER — OXYCODONE-ACETAMINOPHEN 5-325 MG PO TABS: 2.0000 | ORAL_TABLET | ORAL | Status: DC | PRN

## 2023-04-07 MED ORDER — TETANUS-DIPHTH-ACELL PERTUSSIS 5-2.5-18.5 LF-MCG/0.5 IM SUSY: 0.5000 mL | PREFILLED_SYRINGE | Freq: Once | INTRAMUSCULAR | Status: DC

## 2023-04-07 NOTE — Anesthesia Procedure Notes (Signed)
Epidural Patient location during procedure: OB Start time: 04/07/2023 3:05 AM End time: 04/07/2023 3:16 AM  Staffing Anesthesiologist: Lannie Fields, DO Performed: anesthesiologist   Preanesthetic Checklist Completed: patient identified, IV checked, risks and benefits discussed, monitors and equipment checked, pre-op evaluation and timeout performed  Epidural Patient position: sitting Prep: DuraPrep and site prepped and draped Patient monitoring: continuous pulse ox, blood pressure, heart rate and cardiac monitor Approach: midline Location: L3-L4 Injection technique: LOR air  Needle:  Needle type: Tuohy  Needle gauge: 17 G Needle length: 9 cm Needle insertion depth: 5.5 cm Catheter type: closed end flexible Catheter size: 19 Gauge Catheter at skin depth: 11 cm Test dose: negative  Assessment Sensory level: T8 Events: blood not aspirated, no cerebrospinal fluid, injection not painful, no injection resistance, no paresthesia and negative IV test  Additional Notes Patient identified. Risks/Benefits/Options discussed with patient including but not limited to bleeding, infection, nerve damage, paralysis, failed block, incomplete pain control, headache, blood pressure changes, nausea, vomiting, reactions to medication both or allergic, itching and postpartum back pain. Confirmed with bedside nurse the patient's most recent platelet count. Confirmed with patient that they are not currently taking any anticoagulation, have any bleeding history or any family history of bleeding disorders. Patient expressed understanding and wished to proceed. All questions were answered. Sterile technique was used throughout the entire procedure. Please see nursing notes for vital signs. Test dose was given through epidural catheter and negative prior to continuing to dose epidural or start infusion. Warning signs of high block given to the patient including shortness of breath, tingling/numbness in  hands, complete motor block, or any concerning symptoms with instructions to call for help. Patient was given instructions on fall risk and not to get out of bed. All questions and concerns addressed with instructions to call with any issues or inadequate analgesia.    CSE performed with 24G sprotte through tuohy, clear CSF, no issues Reason for block:procedure for pain

## 2023-04-07 NOTE — Lactation Note (Signed)
This note was copied from a baby's chart. Lactation Consultation Note  Patient Name: Girl Fernando Vanhorn [id number] Date: 04/07/2023 Age:28 hours Reason for consult: Initial assessment;Primapara;1st time breastfeeding;Term;Breastfeeding assistance  The infant is at 28 hour old.  LC entered the room and the infant was STS with the birth parent.  Per the birth parent she got the infant to latch on the left breast.  She stated that she wanted to be sure that the infant was latching correctly.  LC reviewed infant behavior and the importance of following feeding cues.  LC assisted the birth parent with attempting to put the infant to the left breast in the football position.  The infant latched deeply with her lips flanged.  She took 3-4 sucks before coming off the breast and going back on.  The infant was off and on the breast for about 3 min.  The birth parent hand expressed and a drop was noted on the right nipple.  LC was about to assist the birth parent with hand expressing and spoon feeding when the infant started showing feeding cues.  LC assisted the birth parent with attempting to put the infant to the right breast in the football position.  The infant was off and on the breast for another 2 min.  LC helped the birth parent to put the infant STS.  The RN entered the room.  LC reviewed outpatient services.  All questions were answered.  The birth parent will call RN/LC for assistance with breastfeeding.   Infant Feeding Plan:  Breastfeed 8+ times in 24 hours according to feeding cues.  Hand express and feed the expressed milk to the infant via a spoon.  Call RN/LC for assistance with breastfeeding.    Maternal Data Has patient been taught Hand Expression?: Yes Does the patient have breastfeeding experience prior to this delivery?: No  Feeding Mother's Current Feeding Choice: Breast Milk  LATCH Score Latch: Repeated attempts needed to sustain latch, nipple held in mouth throughout  feeding, stimulation needed to elicit sucking reflex.  Audible Swallowing: A few with stimulation  Type of Nipple: Everted at rest and after stimulation  Comfort (Breast/Nipple): Soft / non-tender  Hold (Positioning): Assistance needed to correctly position infant at breast and maintain latch.  LATCH Score: 7   Lactation Tools Discussed/Used    Interventions Interventions: Assisted with latch;Adjust position;Support pillows;Skin to skin;Hand express;Breast massage;Education;LC Services brochure  Discharge Pump: Personal;Hands Free  Consult Status Consult Status: Follow-up Date: 04/08/23 Follow-up type: In-patient   Orvil Feil Tyonna Talerico 04/07/2023, 1:24 PM

## 2023-04-07 NOTE — Anesthesia Preprocedure Evaluation (Addendum)
Anesthesia Evaluation  Patient identified by MRN, date of birth, ID band Patient awake    Reviewed: Allergy & Precautions, H&P , Patient's Chart, lab work & pertinent test results  Airway Mallampati: II  TM Distance: >3 FB Neck ROM: Full    Dental no notable dental hx.    Pulmonary neg pulmonary ROS   Pulmonary exam normal breath sounds clear to auscultation       Cardiovascular negative cardio ROS Normal cardiovascular exam Rhythm:Regular Rate:Normal     Neuro/Psych negative neurological ROS  negative psych ROS   GI/Hepatic negative GI ROS, Neg liver ROS,,,  Endo/Other  negative endocrine ROS    Renal/GU negative Renal ROS  negative genitourinary   Musculoskeletal negative musculoskeletal ROS (+)    Abdominal   Peds negative pediatric ROS (+)  Hematology  (+) Blood dyscrasia, anemia Hb 11.2, plt 270   Anesthesia Other Findings   Reproductive/Obstetrics (+) Pregnancy                             Anesthesia Physical Anesthesia Plan  ASA: 2  Anesthesia Plan: Combined Spinal and Epidural   Post-op Pain Management:    Induction:   PONV Risk Score and Plan: 2  Airway Management Planned: Natural Airway  Additional Equipment: None  Intra-op Plan:   Post-operative Plan:   Informed Consent: I have reviewed the patients History and Physical, chart, labs and discussed the procedure including the risks, benefits and alternatives for the proposed anesthesia with the patient or authorized representative who has indicated his/her understanding and acceptance.       Plan Discussed with:   Anesthesia Plan Comments: (8cm dilated at presentation, will proceed w/ CSE)       Anesthesia Quick Evaluation

## 2023-04-07 NOTE — MAU Note (Signed)
Pt pulled straight to MAU exam room c/o SROM and ctx at 0000 - clear fluid. Denies VB. +FM.  0204 SVE: 7.5 / 100 / -2

## 2023-04-07 NOTE — Anesthesia Postprocedure Evaluation (Signed)
Anesthesia Post Note  Patient: Darlene Ortiz  Procedure(s) Performed: AN AD HOC LABOR EPIDURAL     Patient location during evaluation: Mother Baby Anesthesia Type: Epidural Level of consciousness: awake Pain management: satisfactory to patient Vital Signs Assessment: post-procedure vital signs reviewed and stable Respiratory status: spontaneous breathing Cardiovascular status: stable Anesthetic complications: no  No notable events documented.  Last Vitals:  Vitals:   04/07/23 1315 04/07/23 1716  BP: 122/73 120/73  Pulse: 93 91  Resp: 18 18  Temp: 36.7 C 36.7 C  SpO2: 100% 100%    Last Pain:  Vitals:   04/07/23 1716  TempSrc: Oral  PainSc:    Pain Goal: Patients Stated Pain Goal: 0 (04/07/23 0235)                 Cephus Shelling

## 2023-04-07 NOTE — Discharge Summary (Signed)
Postpartum Discharge Summary  Date of Service updated***     Patient Name: Darlene Ortiz DOB: 24-Jun-1996 MRN: [id number]  Date of admission: 04/07/2023 Delivery date:04/07/2023  Delivering provider: Celedonio Savage  Date of discharge: 04/07/2023  Admitting diagnosis: Indication for care in labor and delivery, antepartum [O75.9] Intrauterine pregnancy: [redacted]w[redacted]d     Secondary diagnosis:  Principal Problem:   Indication for care in labor and delivery, antepartum Active Problems:   Supervision of other normal pregnancy, antepartum   Syphilis affecting pregnancy   ASCUS with positive high risk HPV cervical   Anemia of pregnancy   GBS (group B Streptococcus carrier), +RV culture, currently pregnant   Vaginal delivery  Additional problems: ***    Discharge diagnosis: Term Pregnancy Delivered                                              Post partum procedures:{Postpartum procedures:23558} Augmentation:  None Complications: None  Hospital course: Onset of Labor With Vaginal Delivery      27 y.o. yo G2P0010 at [redacted]w[redacted]d was admitted in Active Labor on 04/07/2023. Labor course was uncomplicated. Membrane Rupture Time/Date: 12:00 AM ,04/07/2023   Delivery Method:Vaginal, Spontaneous  Episiotomy:   Lacerations:  1st degree;Perineal  Patient had a postpartum course complicated by ***.  She is ambulating, tolerating a regular diet, passing flatus, and urinating well. Patient is discharged home in stable condition on 04/07/23.  Newborn Data: Birth date:04/07/2023  Birth time:4:40 AM  Gender:Female  Living status:Living  Apgars:8 ,9  Weight:   Magnesium Sulfate received: No BMZ received: No Rhophylac:N/A MMR:N/A T-DaP:Given prenatally Flu: No Transfusion:{Transfusion received:30440034}  Physical exam  Vitals:   04/07/23 0325 04/07/23 0330 04/07/23 0335 04/07/23 0400  BP: 115/68  131/64 118/78  Pulse: 88  93 (!) 113  Resp: 18  16   Temp:      TempSrc:      SpO2: 100% 100% 99%     General: {Exam; general:21111117} Lochia: {Desc; appropriate/inappropriate:30686::"appropriate"} Uterine Fundus: {Desc; firm/soft:30687} Incision: {Exam; incision:21111123} DVT Evaluation: {Exam; dvt:2111122} Labs: Lab Results  Component Value Date   WBC 13.3 (H) 04/07/2023   HGB 11.2 (L) 04/07/2023   HCT 34.4 (L) 04/07/2023   MCV 83.3 04/07/2023   PLT 270 04/07/2023      Latest Ref Rng & Units 08/10/2022    8:10 PM  CMP  Glucose 70 - 99 mg/dL 86   BUN 6 - 20 mg/dL 5   Creatinine 4.09 - 8.11 mg/dL 9.14   Sodium 782 - 956 mmol/L 137   Potassium 3.5 - 5.1 mmol/L 3.2   Chloride 98 - 111 mmol/L 105   CO2 22 - 32 mmol/L 22   Calcium 8.9 - 10.3 mg/dL 9.6   Total Protein 6.5 - 8.1 g/dL 7.2   Total Bilirubin 0.3 - 1.2 mg/dL 0.3   Alkaline Phos 38 - 126 U/L 37   AST 15 - 41 U/L 17   ALT 0 - 44 U/L 11    Edinburgh Score:     No data to display           After visit meds:  Allergies as of 04/07/2023   No Known Allergies   Med Rec must be completed prior to using this [facility]***        Discharge home in stable condition Infant Feeding: {Baby feeding:23562} Infant Disposition:{CHL IP  OB HOME WITH UJWJXB:14782} Discharge instruction: per After Visit Summary and Postpartum booklet. Activity: Advance as tolerated. Pelvic rest for 6 weeks.  Diet: {OB NFAO:13086578} Future Appointments: Future Appointments  Date Time Provider Department Center  04/12/2023  1:15 PM Venora Maples, MD [facility]  04/12/2023  2:15 PM [facility]   Follow up Visit: Message sent   Please schedule this patient for a In person postpartum visit in 6 weeks with the following provider: Any provider. Additional Postpartum F/U: None   Low risk pregnancy complicated by:  None Delivery mode:  Vaginal, Spontaneous  Anticipated Birth Control:  POPs   04/07/2023 Celedonio Savage, MD

## 2023-04-07 NOTE — Lactation Note (Signed)
This note was copied from a baby's chart. Lactation Consultation Note  Patient Name: Girl Wilba Gieck WGNFA'O Date: 04/07/2023 Age:28 hours Reason for consult: Follow-up assessment  P1, term female infant, LC entering the room, Birth Parent was attempting to latch infant on her right breast using the football hold, LC observed infant's latch was shallow. Birth Parent broke latch and LC gave Birth Parent pillows for breastfeeding support, worked on [employer] positing and having infant in alignment with chest to breast. Infant opening mouth wider, after 5 minutes infant started sustaining her latch and was still breastfeeding after 15 minutes when LC left the room.  1-Birth Parent will continue to BF infant by cues, on demand, 8 to 12+ times within 24 hours, STS and knows to call RN/LC for further latch assistance if needed. 2-LC reinforced maternal rest, diet and hydration with Birth Parent.   Maternal Data    Feeding Mother's Current Feeding Choice: Breast Milk  LATCH Score Latch: Repeated attempts needed to sustain latch, nipple held in mouth throughout feeding, stimulation needed to elicit sucking reflex.  Audible Swallowing: A few with stimulation  Type of Nipple: Everted at rest and after stimulation  Comfort (Breast/Nipple): Soft / non-tender  Hold (Positioning): Assistance needed to correctly position infant at breast and maintain latch.  LATCH Score: 7   Lactation Tools Discussed/Used    Interventions Interventions: Position options;Skin to skin;Support pillows;Assisted with latch;Adjust position;Breast compression  Discharge    Consult Status Consult Status: Follow-up Date: 04/08/23 Follow-up type: In-patient    Frederico Hamman 04/07/2023, 5:49 PM

## 2023-04-07 NOTE — H&P (Signed)
OBSTETRIC ADMISSION HISTORY AND PHYSICAL  Darlene Ortiz is a 27 y.o. female G2P0010 with IUP at [redacted]w[redacted]d by LMP presenting for SOL w/ SROM. She reports +FMs, No LOF, no VB, no blurry vision, headaches or peripheral edema, and RUQ pain.  She plans on breast feeding. She request POPs for birth control. She received her prenatal care at  [facility]    Dating: By LMP --->  Estimated Date of Delivery: 04/09/23  Sono:    @[redacted]w[redacted]d , CWD, normal anatomy, cephalic presentation,  anterior placenta, 978g, 45% EFW   Prenatal History/Complications: None  Past Medical History: Past Medical History:  Diagnosis Date   Syphilis 11/2022    Past Surgical History: Past Surgical History:  Procedure Laterality Date   TOOTH EXTRACTION      Obstetrical History: OB History     Gravida  2   Para      Term      Preterm      AB  1   Living         SAB      IAB  1   Ectopic      Multiple      Live Births              Social History Social History   Socioeconomic History   Marital status: Single    Spouse name: Not on file   Number of children: Not on file   Years of education: Not on file   Highest education level: Not on file  Occupational History   Not on file  Tobacco Use   Smoking status: Never   Smokeless tobacco: Never  Vaping Use   Vaping Use: Never used  Substance and Sexual Activity   Alcohol use: Not Currently    Comment: occassionally   Drug use: Not Currently    Types: Marijuana    Comment: May 2021   Sexual activity: Yes    Birth control/protection: None    Comment: last IC 5/2/20204  Other Topics Concern   Not on file  Social History Narrative   Not on file   Social Determinants of Health   Financial Resource Strain: Not on file  Food Insecurity: No Food Insecurity (09/27/2021)   Hunger Vital Sign    Worried About Running Out of Food in the Last Year: Never true    Ran Out of Food in the Last Year: Never true  Transportation Needs: No Transportation  Needs (09/27/2021)   PRAPARE - Administrator, Civil Service (Medical): No    Lack of Transportation (Non-Medical): No  Physical Activity: Not on file  Stress: Not on file  Social Connections: Not on file    Family History: Family History  Problem Relation Age of Onset   Diabetes Mother    Breast cancer Maternal Grandmother     Allergies: No Known Allergies  Medications Prior to Admission  Medication Sig Dispense Refill Last Dose   aspirin EC 81 MG tablet Take 1 tablet (81 mg total) by mouth daily. Take after 12 weeks for prevention of preeclampsia later in pregnancy (Patient not taking: Reported on 02/16/2023) 300 tablet 2    polyethylene glycol powder (GLYCOLAX/MIRALAX) 17 GM/SCOOP powder Take 17 g by mouth daily as needed. (Patient not taking: Reported on 03/01/2023) 510 g 1    Prenatal 28-0.8 MG TABS Take 1 tablet by mouth daily. 30 tablet 12      Review of Systems   All systems reviewed and negative except as  stated in HPI  Last menstrual period 06/26/2022. General appearance: alert, cooperative, and moderate distress Lungs: normal work of breathing  Heart: regular rate  Abdomen: soft, non-tender;  Pelvic: 7.5/80/-1 Extremities: Homans sign is negative, no sign of DVT  Presentation: cephalic Fetal monitoringBaseline: 120 bpm, Variability: Good {> 6 bpm), Accelerations: Reactive, and Decelerations: Absent Uterine activityNone Dilation: 7.5 Exam by:: Fabiola Backer, RN   Prenatal labs: ABO, Rh: A/Positive/-- (11/29 1428) Antibody: Comment, See Final Results (11/29 1428) Rubella: 3.24 (11/29 1428) RPR: Reactive (02/02 0833)  HBsAg: Negative (11/29 1428)  HIV: Non Reactive (02/02 1610)  GBS: Positive/-- (04/08 1629)  1 hr Glucola normal  Genetic screening  LR Anatomy US Normal  Prenatal Transfer Tool  Maternal Diabetes: No Genetic Screening: Normal Maternal Ultrasounds/Referrals: Normal Fetal Ultrasounds or other Referrals:  None Maternal  Substance Abuse:  No Significant Maternal Medications:  None Significant Maternal Lab Results:  Group B Strep positive Number of Prenatal Visits:greater than 3 verified prenatal visits Other Comments:  None  No results found for this or any previous visit (from the past 24 hour(s)).  Patient Active Problem List   Diagnosis Date Noted   Indication for care in labor and delivery, antepartum 04/07/2023   GBS (group B Streptococcus carrier), +RV culture, currently pregnant 03/19/2023   Anemia of pregnancy 01/09/2023   ASCUS with positive high risk HPV cervical 12/22/2022   Syphilis affecting pregnancy 11/06/2022   Supervision of other normal pregnancy, antepartum 10/24/2022   Orbit fracture, right, closed, initial encounter (HCC) 12/22/2021    Assessment/Plan:  [name] is a 27 y.o. G2P0010 at [redacted]w[redacted]d here forSOL  #Labor:Progressing spontaneously  #Pain: Per patient request  #FWB: Cat 1 #ID:  GBS pos- ampicillin  #MOF: Breast #MOC:POPs   Celedonio Savage, MD  04/07/2023, 2:30 AM

## 2023-04-07 NOTE — Progress Notes (Signed)
Assume care of patient at 0400 hrs.

## 2023-04-08 DIAGNOSIS — R030 Elevated blood-pressure reading, without diagnosis of hypertension: Secondary | ICD-10-CM | POA: Diagnosis not present

## 2023-04-08 DIAGNOSIS — O165 Unspecified maternal hypertension, complicating the puerperium: Secondary | ICD-10-CM | POA: Diagnosis not present

## 2023-04-08 NOTE — Progress Notes (Addendum)
Post Partum Day 1 Subjective: no complaints, up ad lib, voiding, and tolerating PO  Objective: Blood pressure 110/74, pulse 90, temperature 98.2 F (36.8 C), temperature source Oral, resp. rate 16, last menstrual period 06/26/2022, SpO2 100 %, unknown if currently breastfeeding.  Physical Exam:  General: alert, cooperative, and no distress Lochia: appropriate Uterine Fundus: firm Incision: NA DVT Evaluation: No evidence of DVT seen on physical exam.  Recent Labs    04/07/23 0220  HGB 11.2*  HCT 34.4*    Assessment/Plan: Plan for discharge tomorrow, Breastfeeding, and Contraception POPs . Pt asked about D/C today but Peds may need baby to stay until tomorrow.  Elevated BP that did not meet criteria for GHNT. Nml now. Will CTO closely.  Late Latent Syphilis. Check titers per CDC recs.    LOS: 1 day   [place] 04/08/2023, 3:58 PM

## 2023-04-09 ENCOUNTER — Encounter: Payer: Self-pay | Admitting: Family Medicine

## 2023-04-09 LAB — T.PALLIDUM AB, TOTAL: T Pallidum Abs: REACTIVE — AB

## 2023-04-09 MED ORDER — POLYETHYLENE GLYCOL 3350 17 GM/SCOOP PO POWD: 17.0000 g | Freq: Every day | ORAL | 1 refills | Status: DC | PRN

## 2023-04-09 MED ORDER — SLYND 4 MG PO TABS: 1.0000 | ORAL_TABLET | Freq: Every day | ORAL | 4 refills | Status: DC

## 2023-04-09 MED ORDER — ACETAMINOPHEN 325 MG PO TABS: 650.0000 mg | ORAL_TABLET | ORAL | 0 refills | Status: DC | PRN

## 2023-04-09 MED ORDER — IBUPROFEN 600 MG PO TABS: 600.0000 mg | ORAL_TABLET | Freq: Four times a day (QID) | ORAL | 0 refills | Status: DC

## 2023-04-11 ENCOUNTER — Encounter: Payer: Self-pay | Admitting: Family Medicine

## 2023-04-12 ENCOUNTER — Encounter: Payer: Medicaid Other | Admitting: Family Medicine

## 2023-04-12 ENCOUNTER — Other Ambulatory Visit: Payer: Self-pay

## 2023-04-12 ENCOUNTER — Other Ambulatory Visit: Payer: Medicaid Other

## 2023-04-17 ENCOUNTER — Encounter (HOSPITAL_COMMUNITY): Payer: Self-pay | Admitting: Obstetrics & Gynecology

## 2023-04-17 ENCOUNTER — Inpatient Hospital Stay (HOSPITAL_COMMUNITY)
Admission: AD | Admit: 2023-04-17 | Discharge: 2023-04-17 | Disposition: A | Discharge status: Home / Self Care | Payer: Medicaid Other | Attending: Obstetrics & Gynecology | Admitting: Obstetrics & Gynecology

## 2023-04-17 DIAGNOSIS — O133 Gestational [pregnancy-induced] hypertension without significant proteinuria, third trimester: Secondary | ICD-10-CM | POA: Insufficient documentation

## 2023-04-17 DIAGNOSIS — Z3A39 39 weeks gestation of pregnancy: Secondary | ICD-10-CM | POA: Diagnosis not present

## 2023-04-17 DIAGNOSIS — Z3480 Encounter for supervision of other normal pregnancy, unspecified trimester: Secondary | ICD-10-CM

## 2023-04-17 DIAGNOSIS — Z3A42 42 weeks gestation of pregnancy: Secondary | ICD-10-CM

## 2023-04-17 DIAGNOSIS — O1495 Unspecified pre-eclampsia, complicating the puerperium: Secondary | ICD-10-CM

## 2023-04-17 DIAGNOSIS — R202 Paresthesia of skin: Secondary | ICD-10-CM | POA: Insufficient documentation

## 2023-04-17 DIAGNOSIS — R200 Anesthesia of skin: Secondary | ICD-10-CM | POA: Diagnosis not present

## 2023-04-17 DIAGNOSIS — M79603 Pain in arm, unspecified: Secondary | ICD-10-CM | POA: Diagnosis present

## 2023-04-17 DIAGNOSIS — O26893 Other specified pregnancy related conditions, third trimester: Secondary | ICD-10-CM | POA: Insufficient documentation

## 2023-04-17 LAB — CBC
HCT: 35.8 % — ABNORMAL LOW (ref 36.0–46.0)
Hemoglobin: 11.1 g/dL — ABNORMAL LOW (ref 12.0–15.0)
MCH: 26.3 pg (ref 26.0–34.0)
MCHC: 31 g/dL (ref 30.0–36.0)
MCV: 84.8 fL (ref 80.0–100.0)
Platelets: 424 10*3/uL — ABNORMAL HIGH (ref 150–400)
RBC: 4.22 MIL/uL (ref 3.87–5.11)
RDW: 16.7 % — ABNORMAL HIGH (ref 11.5–15.5)
WBC: 4.1 10*3/uL (ref 4.0–10.5)
nRBC: 0 % (ref 0.0–0.2)

## 2023-04-17 LAB — COMPREHENSIVE METABOLIC PANEL
ALT: 22 U/L (ref 0–44)
AST: 20 U/L (ref 15–41)
Albumin: 3 g/dL — ABNORMAL LOW (ref 3.5–5.0)
Alkaline Phosphatase: 88 U/L (ref 38–126)
Anion gap: 8 (ref 5–15)
BUN: 8 mg/dL (ref 6–20)
CO2: 26 mmol/L (ref 22–32)
Calcium: 8.9 mg/dL (ref 8.9–10.3)
Chloride: 104 mmol/L (ref 98–111)
Creatinine, Ser: 0.86 mg/dL (ref 0.44–1.00)
GFR, Estimated: >60 mL/min (ref >60)
Glucose, Bld: 80 mg/dL (ref 70–99)
Potassium: 3.8 mmol/L (ref 3.5–5.1)
Sodium: 138 mmol/L (ref 135–145)
Total Bilirubin: 0.2 mg/dL — ABNORMAL LOW (ref 0.3–1.2)
Total Protein: 6.5 g/dL (ref 6.5–8.1)

## 2023-04-17 MED ORDER — NIFEDIPINE 10 MG PO CAPS
20.0000 mg | ORAL_CAPSULE | Freq: Once | ORAL | Status: AC
  Administered 2023-04-17: 20 mg via ORAL
  Filled 2023-04-17: qty 2

## 2023-04-17 MED ORDER — IBUPROFEN 800 MG PO TABS: 800.0000 mg | ORAL_TABLET | Freq: Three times a day (TID) | ORAL | 1 refills | Status: DC | PRN

## 2023-04-17 MED ORDER — NIFEDIPINE ER OSMOTIC RELEASE 30 MG PO TB24: 30.0000 mg | ORAL_TABLET | Freq: Every day | ORAL | 1 refills | Status: DC

## 2023-04-17 MED ORDER — IBUPROFEN 800 MG PO TABS
800.0000 mg | ORAL_TABLET | Freq: Once | ORAL | Status: AC
  Administered 2023-04-17: 800 mg via ORAL
  Filled 2023-04-17: qty 1

## 2023-04-17 NOTE — MAU Provider Note (Signed)
History     CSN: [id number]  Arrival date and time: 04/17/23 1310   Event Date/Time   First Provider Initiated Contact with Patient 04/17/23 1341      Chief Complaint  Patient presents with   Arm Pain   Darlene Ortiz is a 27 y.o. G2P1011 at [redacted]w[redacted]d who presents today with numbness and tingling in her right arm. She states that on 04/12/2023 she slept on her arm and since then she has had this feeling. She states that in the morning it is worse. She is unable to make fist because it is pretty stiff. Otherwise it has been about the same since it started on 5/9. She also has noticed swelling in both feet. She denies any HA, visual disturbance or RUQ pain. She denies any chest pain or shortness of breath. She states that while she was here for delivery the IV she was in the left arm.     OB History     Gravida  2   Para  1   Term  1   Preterm      AB  1   Living  1      SAB      IAB  1   Ectopic      Multiple  0   Live Births  1           Past Medical History:  Diagnosis Date   Syphilis 11/2022    Past Surgical History:  Procedure Laterality Date   TOOTH EXTRACTION      Family History  Problem Relation Age of Onset   Diabetes Mother    Breast cancer Maternal Grandmother     Social History   Tobacco Use   Smoking status: Never   Smokeless tobacco: Never  Vaping Use   Vaping Use: Never used  Substance Use Topics   Alcohol use: Not Currently    Comment: occassionally   Drug use: Not Currently    Types: Marijuana    Comment: May 2021    Allergies: No Known Allergies  Medications Prior to Admission  Medication Sig Dispense Refill Last Dose   acetaminophen (TYLENOL) 325 MG tablet Take 2 tablets (650 mg total) by mouth every 4 (four) hours as needed (for pain scale < 4). 30 tablet 0    Drospirenone (SLYND) 4 MG TABS Take 1 tablet (4 mg total) by mouth daily. 84 tablet 4    ibuprofen (ADVIL) 600 MG tablet Take 1 tablet (600 mg total) by mouth  every 6 (six) hours. 30 tablet 0    polyethylene glycol powder (GLYCOLAX/MIRALAX) 17 GM/SCOOP powder Take 17 g by mouth daily as needed. 510 g 1    Prenatal 28-0.8 MG TABS Take 1 tablet by mouth daily. 30 tablet 12     Review of Systems  All other systems reviewed and are negative.  Physical Exam   Blood pressure 139/86, pulse 83, temperature 98.5 F (36.9 C), temperature source Oral, resp. rate 18, height 5\' 8"  (1.727 m), weight 85.3 kg, unknown if currently breastfeeding.  Physical Exam Vitals and nursing note reviewed.  Constitutional:      General: She is not in acute distress. HENT:     Head: Normocephalic.  Eyes:     Pupils: Pupils are equal, round, and reactive to light.  Cardiovascular:     Rate and Rhythm: Normal rate.  Pulmonary:     Effort: Pulmonary effort is normal.  Abdominal:     Palpations: Abdomen is soft.  Tenderness: There is no abdominal tenderness.  Musculoskeletal:        General: No swelling. Normal range of motion.  Skin:    General: Skin is warm and dry.  Neurological:     Mental Status: She is alert and oriented to person, place, and time.     Comments: Reflexes normal No clonus   Psychiatric:        Mood and Affect: Mood normal.        Behavior: Behavior normal.      Patient Vitals for the past 24 hrs:  BP Temp Temp src Pulse Resp Height Weight  04/17/23 1620 139/86 -- -- 83 -- -- --  04/17/23 1617 129/69 98.5 F (36.9 C) Oral 74 18 -- --  04/17/23 1610 129/69 -- -- 81 -- -- --  04/17/23 1606 114/63 -- -- 86 -- -- --  04/17/23 1415 (!) 143/100 -- -- 69 -- -- --  04/17/23 1400 (!) 139/93 -- -- 72 -- -- --  04/17/23 1356 (!) 148/100 -- -- 70 -- -- --  04/17/23 1351 (!) 133/116 -- -- 76 -- -- --  04/17/23 1333 138/86 -- -- 77 -- -- --  04/17/23 1325 (!) 148/102 -- -- 73 -- -- --  04/17/23 1323 -- 98.4 F (36.9 C) Oral -- 18 5\' 8"  (1.727 m) 85.3 kg     Results for orders placed or performed during the hospital encounter of  04/17/23 (from the past 24 hour(s))  CBC     Status: Abnormal   Collection Time: 04/17/23  1:45 PM  Result Value Ref Range   WBC 4.1 4.0 - 10.5 K/uL   RBC 4.22 3.87 - 5.11 MIL/uL   Hemoglobin 11.1 (L) 12.0 - 15.0 g/dL   HCT 16.1 (L) 09.6 - 04.5 %   MCV 84.8 80.0 - 100.0 fL   MCH 26.3 26.0 - 34.0 pg   MCHC 31.0 30.0 - 36.0 g/dL   RDW 40.9 (H) 81.1 - 91.4 %   Platelets 424 (H) 150 - 400 K/uL   nRBC 0.0 0.0 - 0.2 %  Comprehensive metabolic panel     Status: Abnormal   Collection Time: 04/17/23  1:45 PM  Result Value Ref Range   Sodium 138 135 - 145 mmol/L   Potassium 3.8 3.5 - 5.1 mmol/L   Chloride 104 98 - 111 mmol/L   CO2 26 22 - 32 mmol/L   Glucose, Bld 80 70 - 99 mg/dL   BUN 8 6 - 20 mg/dL   Creatinine, Ser 7.82 0.44 - 1.00 mg/dL   Calcium 8.9 8.9 - 95.6 mg/dL   Total Protein 6.5 6.5 - 8.1 g/dL   Albumin 3.0 (L) 3.5 - 5.0 g/dL   AST 20 15 - 41 U/L   ALT 22 0 - 44 U/L   Alkaline Phosphatase 88 38 - 126 U/L   Total Bilirubin 0.2 (L) 0.3 - 1.2 mg/dL   GFR, Estimated >21 >30 mL/min   Anion gap 8 5 - 15    MAU Course  Procedures  MDM 3:03 PM: DW Dr. Debroah Loop will give short acting procardia while she is here in MAU and then start procardia daily 30mg  XL. Will send a message to the clinic to schedule a one week BP check.   Patient has had ibuprofen and pain has improved.   Assessment and Plan   1. Gestational hypertension with significant proteinuria, postpartum   2. Supervision of other normal pregnancy, antepartum    DC home in  stable condition  Message sent to the office to schedule patient for BP check in one week.  Pre-eclampsia warning signs  RX: procardia 30mg  XL q day #30, ibuprofen 800mg  q 8 hours #30 with 1RF  Return to MAU as needed FU with OB as planned   Follow-up Information     Center for [facility] at [facility] Follow up.   Specialty: Obstetrics and Gynecology Why: They will call you with an appointment Contact  information: [address] [place] [id number] [phone]               Thressa Sheller DNP, CNM  04/17/23  4:21 PM

## 2023-04-17 NOTE — MAU Note (Addendum)
Tingles in right hand, pain in right arm, swollen feet. Patient slept on arm wrong 5/9 and has had pain in that arm since then. Now has tingling in that arm and hand. Patient is unable to create a fist in the mornings after sleeping.

## 2023-04-18 ENCOUNTER — Telehealth (HOSPITAL_COMMUNITY): Payer: Self-pay | Admitting: *Deleted

## 2023-04-18 NOTE — Telephone Encounter (Signed)
Mom reports feeling good. No concerns about herself at this time. EPDS=1 [facility] score=0) Mom reports baby is doing well. Feeding, peeing, and pooping without difficulty. Safe sleep reviewed. Mom reports no concerns about baby at present.  Duffy Rhody, RN 04-18-2023 at 9:15am

## 2023-04-24 ENCOUNTER — Other Ambulatory Visit: Payer: Self-pay

## 2023-04-24 ENCOUNTER — Ambulatory Visit: Payer: Medicaid Other | Admitting: Family Medicine

## 2023-04-24 VITALS — BP 133/92 | HR 90 | Ht 68.0 in | Wt 179.5 lb

## 2023-04-24 DIAGNOSIS — O165 Unspecified maternal hypertension, complicating the puerperium: Secondary | ICD-10-CM

## 2023-04-24 MED ORDER — NIFEDIPINE ER OSMOTIC RELEASE 30 MG PO TB24
30.0000 mg | ORAL_TABLET | Freq: Two times a day (BID) | ORAL | 1 refills | Status: DC
Start: 2023-04-24 — End: 2023-10-02

## 2023-04-24 NOTE — Progress Notes (Signed)
   MOM+BABY COMBINED CARE GYNECOLOGY OFFICE VISIT NOTE  History:   Darlene Ortiz is a 27 y.o. G2P1011 here today for BP check.  Had some elevated BP's during admission for delivery but normotensive after delivery Went to MAU on 04/17/2023 and had multiple elevated BP's, started on Procardia 30 XL  Today denies headache, vision changes, chest pain, SOB, RUQ pain, LE edema Last took procardia yesterday morning  Health Maintenance Due  Topic Date Due   COVID-19 Vaccine (3 - 2023-24 season) 08/04/2022    Past Medical History:  Diagnosis Date   Syphilis 11/2022    Past Surgical History:  Procedure Laterality Date   TOOTH EXTRACTION      The following portions of the patient's history were reviewed and updated as appropriate: allergies, current medications, past family history, past medical history, past social history, past surgical history and problem list.   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  Physical Exam:  BP (!) 133/92   Pulse 90   Ht 5\' 8"  (1.727 m)   Wt 179 lb 8 oz (81.4 kg)   LMP  (LMP Unknown)   Breastfeeding Yes   BMI 27.29 kg/m  CONSTITUTIONAL: Well-developed, well-nourished female in no acute distress.  HEENT:  Normocephalic, atraumatic. External right and left ear normal. No scleral icterus.  NECK: Normal range of motion, supple, no masses noted on observation SKIN: No rash noted. Not diaphoretic. No erythema. No pallor. MUSCULOSKELETAL: Normal range of motion. No edema noted. NEUROLOGIC: Alert and oriented to person, place, and time. Normal muscle tone coordination.  PSYCHIATRIC: Normal mood and affect. Normal behavior. Normal judgment and  thought content. RESPIRATORY: Effort normal, no problems with respiration noted   Labs and Imaging No results found for this or any previous visit (from the past 168 hour(s)). No results found.    Assessment and Plan:   Problem List Items Addressed This Visit       Cardiovascular and Mediastinum   Postpartum hypertension - Primary    Diagnosed after delivery at MAU visit. On procardia 30 XL but has not taken a dose today, though usually taking it at 4 PM so will increase to BID. Ongoing mild range Bps, asymptomatic. Instructed to continue until 3 days before postpartum visit and then discontinue to see how she does.       Relevant Medications   NIFEdipine (PROCARDIA XL) 30 MG 24 hr tablet    Routine preventative health maintenance measures emphasized. Please refer to After Visit Summary for other counseling recommendations.   Return in about 2 weeks (around 05/08/2023) for [facility] patient, PP check.    Total face-to-face time with patient: 15 minutes.  Over 50% of encounter was spent on counseling and coordination of care.   Venora Maples, MD/MPH Attending Family Medicine Physician, [facility] for [facility], [facility]

## 2023-04-24 NOTE — Assessment & Plan Note (Addendum)
Diagnosed after delivery at MAU visit. On procardia 30 XL but has not taken a dose today, though usually taking it at 4 PM so will increase to BID. Ongoing mild range Bps, asymptomatic. Instructed to continue until 3 days before postpartum visit and then discontinue to see how she does.

## 2023-05-09 ENCOUNTER — Other Ambulatory Visit (HOSPITAL_COMMUNITY)
Admission: RE | Admit: 2023-05-09 | Discharge: 2023-05-09 | Disposition: A | Discharge status: Home / Self Care | Payer: Medicaid Other | Source: Ambulatory Visit | Attending: Family Medicine | Admitting: Family Medicine

## 2023-05-09 ENCOUNTER — Ambulatory Visit (INDEPENDENT_AMBULATORY_CARE_PROVIDER_SITE_OTHER): Payer: Medicaid Other | Admitting: Family Medicine

## 2023-05-09 DIAGNOSIS — R87610 Atypical squamous cells of undetermined significance on cytologic smear of cervix (ASC-US): Secondary | ICD-10-CM

## 2023-05-09 DIAGNOSIS — O98113 Syphilis complicating pregnancy, third trimester: Secondary | ICD-10-CM | POA: Diagnosis present

## 2023-05-09 DIAGNOSIS — B9689 Other specified bacterial agents as the cause of diseases classified elsewhere: Secondary | ICD-10-CM

## 2023-05-09 DIAGNOSIS — N760 Acute vaginitis: Secondary | ICD-10-CM

## 2023-05-09 DIAGNOSIS — O99013 Anemia complicating pregnancy, third trimester: Secondary | ICD-10-CM

## 2023-05-09 DIAGNOSIS — R87810 Cervical high risk human papillomavirus (HPV) DNA test positive: Secondary | ICD-10-CM | POA: Diagnosis not present

## 2023-05-09 DIAGNOSIS — O165 Unspecified maternal hypertension, complicating the puerperium: Secondary | ICD-10-CM

## 2023-05-09 NOTE — Progress Notes (Signed)
Post Partum Visit Note  Darlene Ortiz is a 27 y.o. G81P1011 female who presents for a postpartum visit. She is 4 weeks postpartum following a normal spontaneous vaginal delivery.  I have fully reviewed the prenatal and intrapartum course. The delivery was at 39.5 gestational weeks.  Anesthesia: epidural. Postpartum course has been uncomplicated. Baby is doing well. Baby is feeding by both breast and bottle - Similac Advance. Bleeding no bleeding. Bowel function is normal. Bladder function is normal. Patient is sexually active. Contraception method is oral progesterone-only contraceptive. Postpartum depression screening: negative.   The pregnancy intention screening data noted above was reviewed. Potential methods of contraception were discussed. The patient elected to proceed with No data recorded.    Health Maintenance Due  Topic Date Due   COVID-19 Vaccine (3 - 2023-24 season) 08/04/2022    The following portions of the patient's history were reviewed and updated as appropriate: allergies, current medications, past family history, past medical history, past social history, past surgical history, and problem list.  Review of Systems Pertinent items are noted in HPI.  Objective:  BP 136/69   Pulse 94   Ht 5\' 8"  (1.727 m)   Wt 178 lb 6.4 oz (80.9 kg)   LMP  (LMP Unknown)   BMI 27.13 kg/m    General:  alert, cooperative, and appears stated age   Breasts:  not indicated  Lungs: clear to auscultation bilaterally  Heart:  regular rate and rhythm, S1, S2 normal, no murmur, click, rub or gallop  Abdomen: soft, non-tender; bowel sounds normal; no masses,  no organomegaly   Wound NA  GU exam:  not indicated       Assessment:    Normal postpartum exam.   Plan:   Essential components of care per ACOG recommendations:  1.  Mood and well being: Patient with negative depression screening today. Reviewed local resources for support.  - Patient tobacco use? No.   - hx of drug use?  No.    2. Infant care and feeding:  -Patient currently breastmilk feeding? Yes. Reviewed importance of draining breast regularly to support lactation.  -Social determinants of health (SDOH) reviewed in EPIC. No concerns  3. Sexuality, contraception and birth spacing - Patient does not want a pregnancy in the next year.  Desired family size is 2 children.  - Reviewed reproductive life planning. Reviewed contraceptive methods based on pt preferences and effectiveness.  Patient desired Oral Contraceptive today.   - Discussed birth spacing of 18 months  4. Sleep and fatigue -Encouraged family/partner/community support of 4 hrs of uninterrupted sleep to help with mood and fatigue  5. Physical Recovery  - Discussed patients delivery and complications. She describes her labor as good. - Patient had a Vaginal, no problems at delivery. Patient had a 1st degree laceration. Perineal healing reviewed. Patient expressed understanding - Patient has urinary incontinence? No. - Patient is safe to resume physical and sexual activity  6.  Health Maintenance - HM due items addressed Yes - Last pap smear  Diagnosis  Date Value Ref Range Status  10/30/2022 (A)  Final   - Atypical squamous cells of undetermined significance (ASC-US)   Pap smear not done at today's visit.  -Breast Cancer screening indicated? No.   7. Chronic Disease/Pregnancy Condition follow up: Anemia and Hypertension  #anemia-- Resolved, last check was 11.1 on 5/14  #HTN-- resolved  #ASCUS HPV pos, prior colpo in 2022 was no CIN but pap was ASUCS HPV pos at that time.  Needs colpo. Will schedule  #Vaginal odor: reported sudden onset of fishy smelling odor. On exam gray/white copious discharge. Suspected BV and swab confirmed - Sent in metronidazole  - PCP follow up  Future Appointments  Date Time Provider Department Center  05/14/2023 10:55 AM Federico Flake, MD [facility]    Federico Flake, MD Center for  [facility], [facility]

## 2023-05-10 ENCOUNTER — Telehealth: Payer: Self-pay

## 2023-05-10 LAB — CERVICOVAGINAL ANCILLARY ONLY
Bacterial Vaginitis (gardnerella): POSITIVE — AB
Candida Glabrata: NEGATIVE
Candida Vaginitis: NEGATIVE
Chlamydia: NEGATIVE
Comment: NEGATIVE
Comment: NEGATIVE
Comment: NEGATIVE
Comment: NEGATIVE
Comment: NEGATIVE
Comment: NORMAL
Neisseria Gonorrhea: NEGATIVE
Trichomonas: NEGATIVE

## 2023-05-10 MED ORDER — METRONIDAZOLE 500 MG PO TABS
500.0000 mg | ORAL_TABLET | Freq: Two times a day (BID) | ORAL | 0 refills | Status: DC
Start: 2023-05-10 — End: 2023-06-21

## 2023-05-10 NOTE — Telephone Encounter (Signed)
Call placed to pt. Spoke with pt. Pt given results per Dr Alvester Morin. Pt verbalized understanding and agreeable to plan of care.  Judeth Cornfield, RNC

## 2023-05-10 NOTE — Telephone Encounter (Signed)
-----   Message from Federico Flake, MD sent at 05/10/2023  3:31 PM EDT ----- BV present. I sent in metronidazole

## 2023-05-11 LAB — HERPES SIMPLEX VIRUS CULTURE

## 2023-05-14 ENCOUNTER — Encounter: Payer: Self-pay | Admitting: Family Medicine

## 2023-05-14 ENCOUNTER — Other Ambulatory Visit: Payer: Self-pay

## 2023-05-14 ENCOUNTER — Other Ambulatory Visit (HOSPITAL_COMMUNITY)
Admission: RE | Admit: 2023-05-14 | Discharge: 2023-05-14 | Disposition: A | Discharge status: Home / Self Care | Payer: Medicaid Other | Source: Ambulatory Visit | Attending: Family Medicine | Admitting: Family Medicine

## 2023-05-14 ENCOUNTER — Ambulatory Visit (INDEPENDENT_AMBULATORY_CARE_PROVIDER_SITE_OTHER): Payer: Medicaid Other | Admitting: Family Medicine

## 2023-05-14 VITALS — BP 119/74 | HR 109 | Ht 68.0 in | Wt 180.3 lb

## 2023-05-14 DIAGNOSIS — R87610 Atypical squamous cells of undetermined significance on cytologic smear of cervix (ASC-US): Secondary | ICD-10-CM | POA: Insufficient documentation

## 2023-05-14 DIAGNOSIS — R87810 Cervical high risk human papillomavirus (HPV) DNA test positive: Secondary | ICD-10-CM | POA: Insufficient documentation

## 2023-05-14 NOTE — Progress Notes (Signed)
    GYNECOLOGY OFFICE COLPOSCOPY PROCEDURE NOTE  27 y.o. G2P1011 here for colposcopy for ASCUS with POSITIVE high risk HPV pap smear on 10/30/22. Discussed role for HPV in cervical dysplasia, need for surveillance.  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  Placed in lithotomy position. Cervix viewed with speculum and colposcope after application of acetic acid.   Physical Exam Exam conducted with a chaperone present.  Genitourinary:    Vagina: Bleeding (menstrual blood in the vaginal vault) present.     Uterus: Normal.         Comments: Two bx at 10 and 12    Colposcopy adequate? Yes  acetowhite lesion(s) noted at 10- 12 o'clock; corresponding biopsies obtained.  ECC specimen obtained. All specimens were labeled and sent to pathology.  Chaperone was present during entire procedure.  Patient was given post procedure instructions.  Will follow up pathology and manage accordingly; patient will be contacted with results and recommendations.  Routine preventative health maintenance measures emphasized.   Federico Flake, MD, MPH, ABFM, [facility] Attending Physician Center for [facility], [facility]

## 2023-05-16 LAB — SURGICAL PATHOLOGY

## 2023-05-17 ENCOUNTER — Telehealth: Payer: Self-pay

## 2023-05-17 NOTE — Telephone Encounter (Addendum)
-----   Message from Federico Flake, MD sent at 05/17/2023 10:56 AM EDT ----- CIN 1, repeat pap in 1 year  Pt notified of results and provider recommendation.   Addison Naegeli, RN 05/17/23

## 2023-06-01 ENCOUNTER — Encounter (HOSPITAL_COMMUNITY): Payer: Self-pay

## 2023-06-20 ENCOUNTER — Ambulatory Visit (HOSPITAL_COMMUNITY): Payer: Self-pay

## 2023-06-21 ENCOUNTER — Ambulatory Visit
Admission: RE | Admit: 2023-06-21 | Discharge: 2023-06-21 | Disposition: A | Discharge status: Home / Self Care | Payer: Medicaid Other | Source: Ambulatory Visit | Attending: Internal Medicine | Admitting: Internal Medicine

## 2023-06-21 VITALS — BP 119/80 | HR 59 | Temp 98.6°F | Resp 18

## 2023-06-21 DIAGNOSIS — N898 Other specified noninflammatory disorders of vagina: Secondary | ICD-10-CM | POA: Diagnosis not present

## 2023-06-21 DIAGNOSIS — Z113 Encounter for screening for infections with a predominantly sexual mode of transmission: Secondary | ICD-10-CM | POA: Insufficient documentation

## 2023-06-21 MED ORDER — METRONIDAZOLE 500 MG PO TABS
500.0000 mg | ORAL_TABLET | Freq: Two times a day (BID) | ORAL | 0 refills | Status: DC
Start: 2023-06-21 — End: 2023-09-24

## 2023-06-21 NOTE — ED Triage Notes (Signed)
Pt presents with c/o vaginal discharge and odor x 5 days.

## 2023-06-21 NOTE — ED Provider Notes (Signed)
[facility]    CSN: [id number] Arrival date & time: 06/21/23  1037      History   Chief Complaint Chief Complaint  Patient presents with   Vaginal Discharge   VAGINAL ODOR    HPI Darlene Ortiz is a 27 y.o. female presents for evaluation of vaginal discharge.  Patient reports 5 days of a vaginal discharge with odor.  Denies any dysuria, vaginal itching, fevers, nausea/vomiting, flank pain.  No STD exposure but she would like screening while she is here.  She is 2 months postpartum and states she is not breast-feeding.  Has a history of BV infections and state her current symptoms are consistent with this.  No OTC medications have been used since onset.  No other concerns at this time.   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 Authorizing Provider  metroNIDAZOLE (FLAGYL) 500 MG tablet Take 1 tablet (500 mg total) by mouth 2 (two) times daily. 06/21/23  Yes Radford Pax, NP  Drospirenone (SLYND) 4 MG TABS Take 1 tablet (4 mg total) by mouth daily. 04/09/23   Venora Maples, MD  ibuprofen (ADVIL) 800 MG tablet  Take 1 tablet (800 mg total) by mouth every 8 (eight) hours as needed. Patient not taking: Reported on 05/09/2023 04/17/23   Armando Reichert, CNM  NIFEdipine (PROCARDIA XL) 30 MG 24 hr tablet Take 1 tablet (30 mg total) by mouth in the morning and at bedtime. Patient not taking: Reported on 05/09/2023 04/24/23   Venora Maples, MD  Prenatal 28-0.8 MG TABS Take 1 tablet by mouth daily. Patient not taking: Reported on 05/09/2023 08/03/22   Tereso Newcomer, MD    Family History Family History  Problem Relation Age of Onset   Diabetes Mother    Breast cancer Maternal Grandmother     Social History Social History   Tobacco Use   Smoking status: Never   Smokeless tobacco: Never  Vaping Use   Vaping status: Never Used  Substance Use Topics   Alcohol use: Not Currently    Comment: occassionally   Drug use: Not Currently    Types: Marijuana    Comment: May 2021     Allergies   Patient has no known allergies.   Review of Systems Review of Systems  Genitourinary:  Positive for vaginal discharge.  Physical Exam Triage Vital Signs ED Triage Vitals  Encounter Vitals Group     BP 06/21/23 1115 119/80     Systolic BP Percentile --      Diastolic BP Percentile --      Pulse Rate 06/21/23 1114 (!) 59     Resp 06/21/23 1114 18     Temp 06/21/23 1114 98.6 F (37 C)     Temp Source 06/21/23 1114 Oral     SpO2 06/21/23 1114 97 %     Weight --      Height --      Head Circumference --      Peak Flow --      Pain Score 06/21/23 1113 0     Pain Loc --      Pain Education --      Exclude from Growth Chart --    No data found.  Updated Vital Signs BP 119/80   Pulse (!) 59   Temp 98.6 F (37 C) (Oral)   Resp 18   LMP 06/14/2023 (Approximate)   SpO2 97%   Breastfeeding No   Visual Acuity Right Eye Distance:   Left Eye Distance:   Bilateral Distance:    Right Eye Near:   Left Eye Near:    Bilateral Near:     Physical Exam Vitals and nursing note reviewed.   Constitutional:      Appearance: Normal appearance.  HENT:     Head: Normocephalic and atraumatic.  Eyes:     Pupils: Pupils are equal, round, and reactive to light.  Cardiovascular:     Rate and Rhythm: Normal rate.  Pulmonary:     Effort: Pulmonary effort is normal.  Abdominal:     Tenderness: There is no right CVA tenderness or left CVA tenderness.  Skin:    General: Skin is warm and dry.  Neurological:     General: No focal deficit present.     Mental Status: She is alert and oriented to person, place, and time.  Psychiatric:        Mood and Affect: Mood normal.        Behavior: Behavior normal.      UC Treatments / Results  Labs (all labs ordered are listed, but only abnormal results are displayed) Labs Reviewed  RPR  HIV ANTIBODY (ROUTINE TESTING W REFLEX)  CERVICOVAGINAL ANCILLARY ONLY    EKG   Radiology No results found.  Procedures Procedures (including critical care time)  Medications Ordered in UC Medications - No data to display  Initial Impression / Assessment and Plan / UC Course  I have reviewed the triage vital signs and the nursing notes.  Pertinent labs & imaging results that were available during my care of the patient were reviewed by me and considered in my medical decision making (see chart for details).     Reviewed exam and concerns with patient.  No red flags.  Vaginal swab/STD testing is ordered and will contact for any positive results.  Will treat for BV with Flagyl as patient states these are similar to previous infections.  Patient is not breast-feeding.  PCP or GYN follow-up if symptoms do not improve.  ER precautions reviewed and patient verbalized understanding. Final Clinical Impressions(s) / UC Diagnoses   Final diagnoses:  Vaginal discharge  Screening examination for STD (sexually transmitted disease)     Discharge Instructions      The clinic will contact you with results of the vaginal swab/testing done today if  positive.  Start metronidazole twice daily for 7 days to treat your vaginal discharge/likely BV infection.  Please follow-up with your PCP or gynecologist if symptoms do not improve.  Please go to the ER for any worsening symptoms.  I hope you feel better soon!    ED Prescriptions     Medication Sig Dispense Auth. Provider   metroNIDAZOLE (FLAGYL) 500 MG tablet Take 1 tablet (500 mg total) by mouth 2 (two) times daily. 14 tablet Radford Pax, NP      PDMP not reviewed this encounter.   Radford Pax, NP 06/21/23 1136

## 2023-06-21 NOTE — Discharge Instructions (Addendum)
The clinic will contact you with results of the vaginal swab/testing done today if positive.  Start metronidazole twice daily for 7 days to treat your vaginal discharge/likely BV infection.  Please follow-up with your PCP or gynecologist if symptoms do not improve.  Please go to the ER for any worsening symptoms.  I hope you feel better soon!

## 2023-06-22 LAB — CERVICOVAGINAL ANCILLARY ONLY
Bacterial Vaginitis (gardnerella): POSITIVE — AB
Candida Glabrata: NEGATIVE
Candida Vaginitis: NEGATIVE
Chlamydia: NEGATIVE
Comment: NEGATIVE
Comment: NEGATIVE
Comment: NEGATIVE
Comment: NEGATIVE
Comment: NEGATIVE
Comment: NORMAL
Neisseria Gonorrhea: NEGATIVE
Trichomonas: NEGATIVE

## 2023-06-23 LAB — RPR: RPR Ser Ql: NONREACTIVE

## 2023-06-23 LAB — HIV ANTIBODY (ROUTINE TESTING W REFLEX): HIV Screen 4th Generation wRfx: NONREACTIVE

## 2023-09-05 IMAGING — CT CT HEAD W/O CM
4 series · 16 of 47 positions shown, 18 images · non-contrast
Comparison: Head CT 08/16/2015

CLINICAL DATA: Head, neck and facial trauma.



[Series 3: head bone · axial · 0.45mm/px · z∈[-96,-64]mm · 3 of 79 slices shown]
[im 8/79  bone]
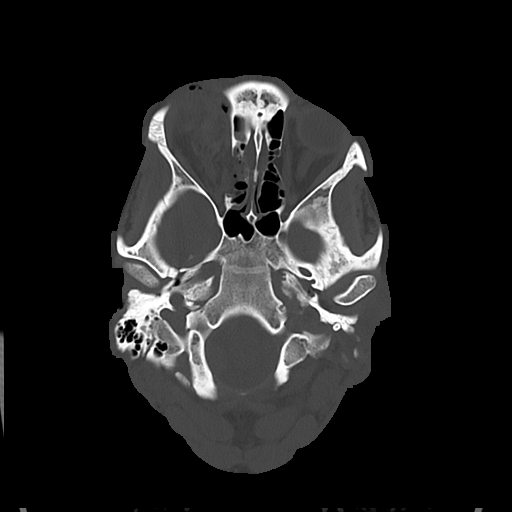
[im 16/79  bone]
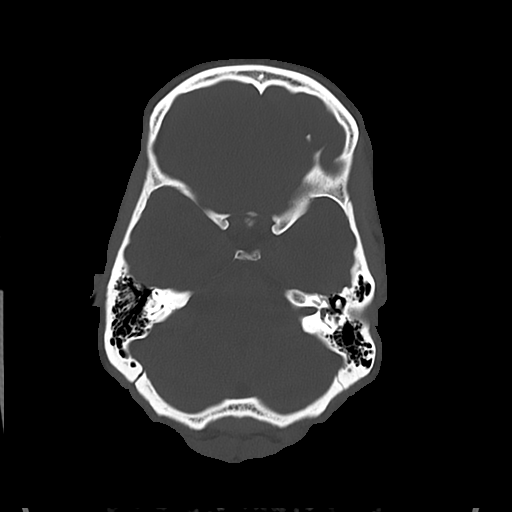
[im 24/79  bone]
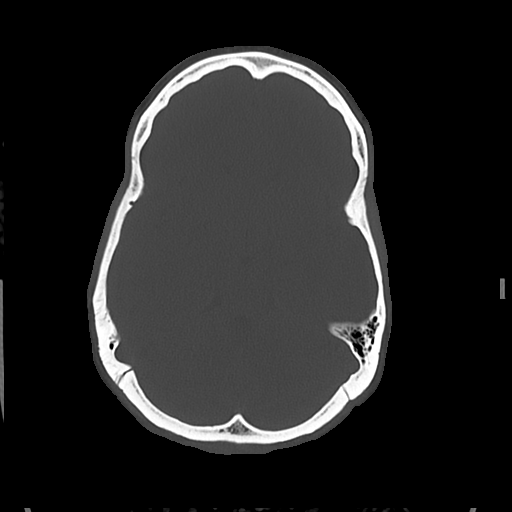

[Series 4: head wo · axial · 0.45mm/px · z∈[-94,+26]mm · 7 of 32 slices shown, 9 images]
[im 4/32  brain]
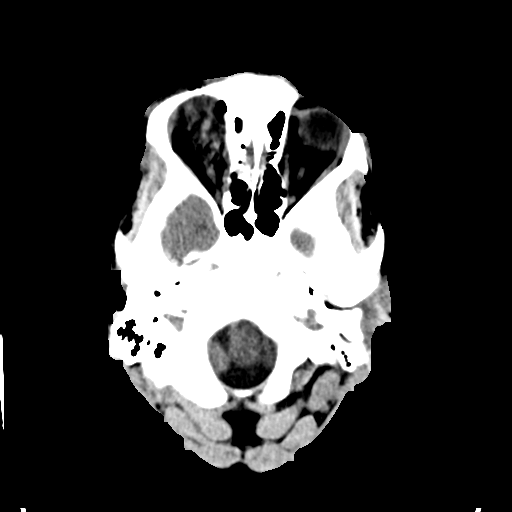
[im 4/32  bone]
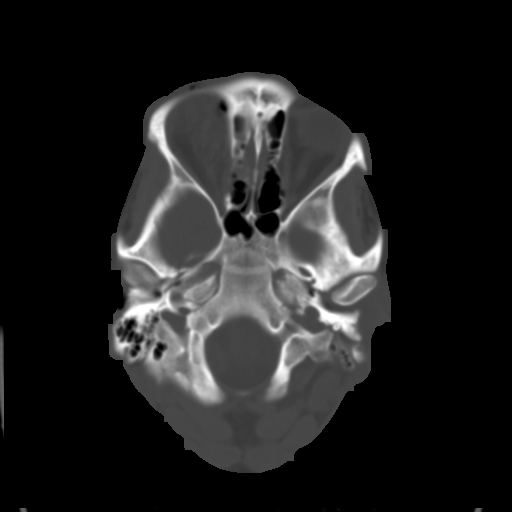
[im 8/32  brain]
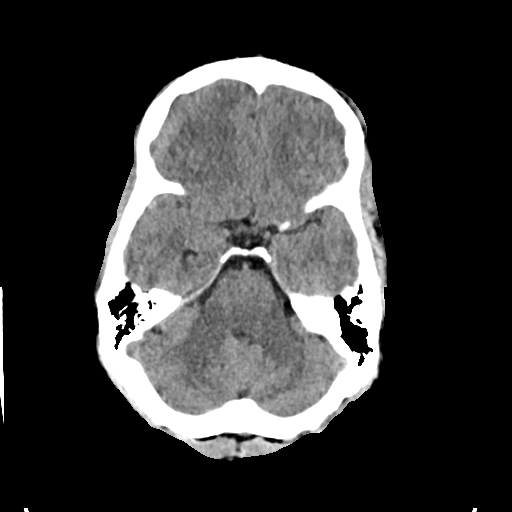
[im 12/32  brain]
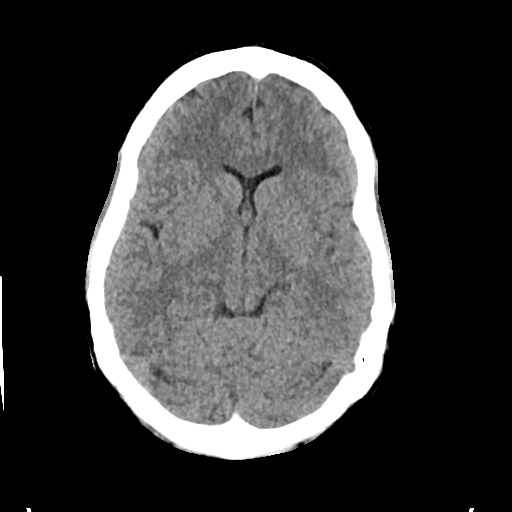
[im 16/32  brain]
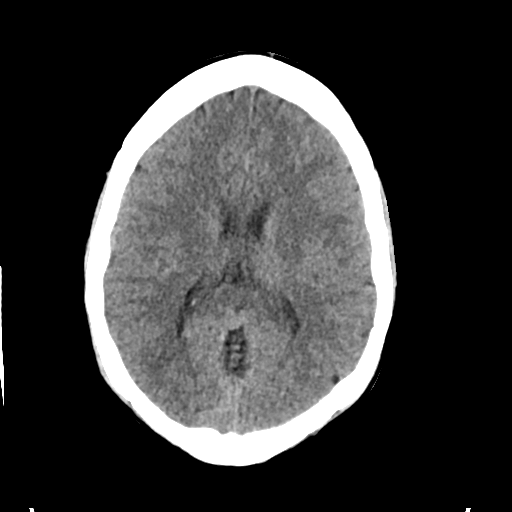
[im 20/32  brain]
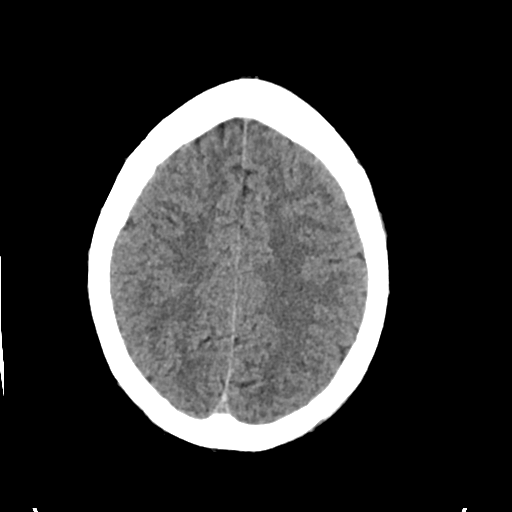
[im 20/32  bone]
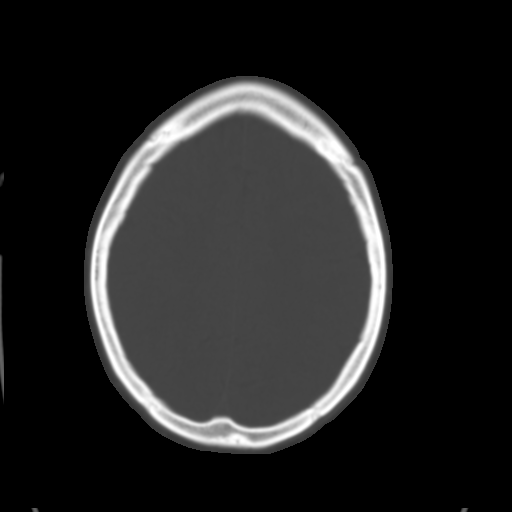
[im 24/32  brain]
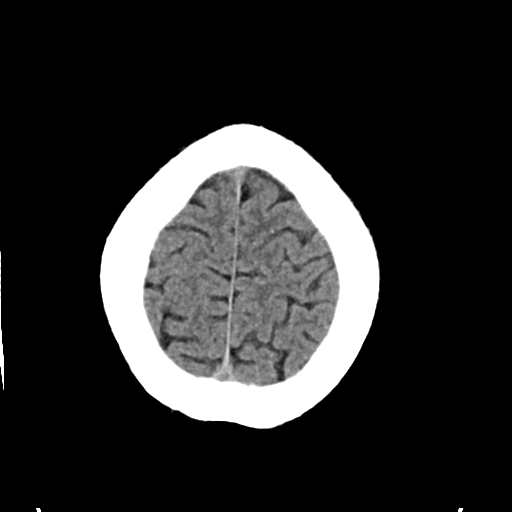
[im 28/32  brain]
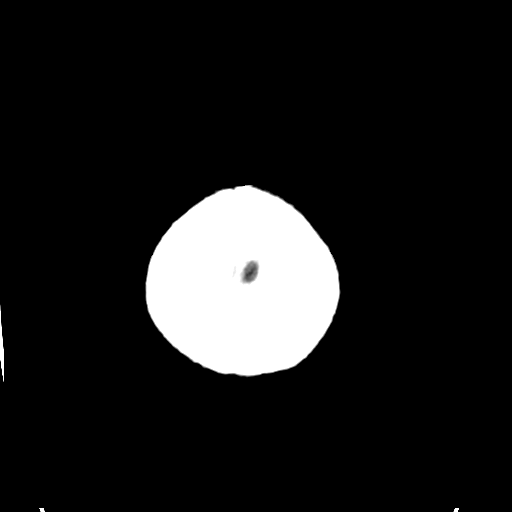

[Series 5: cor soft · coronal · 0.32mm/px · 3 of 68 slices shown]
[im 23/68  brain]
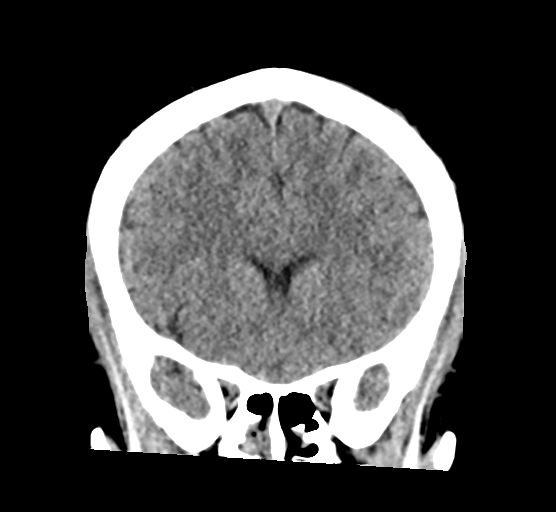
[im 30/68  brain]
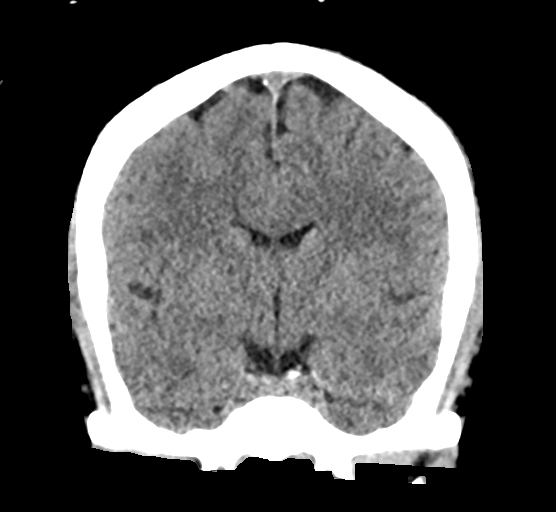
[im 38/68  brain]
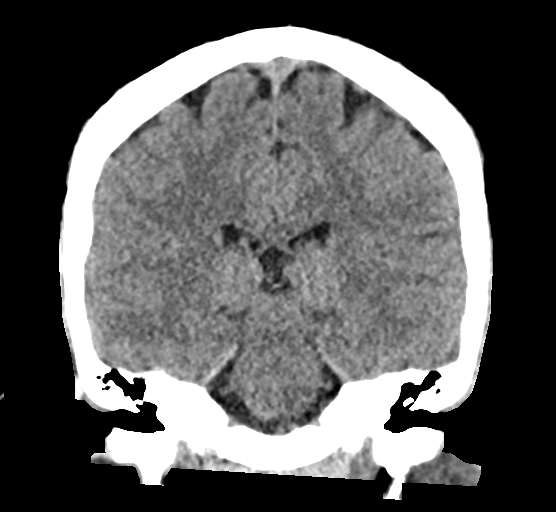

[Series 6: sag soft · sagittal · 0.32mm/px · 3 of 55 slices shown]
[im 19/55  brain]
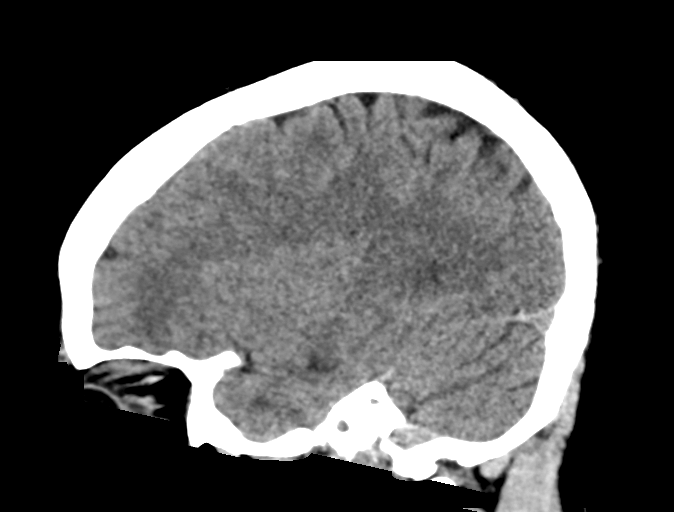
[im 28/55  brain]
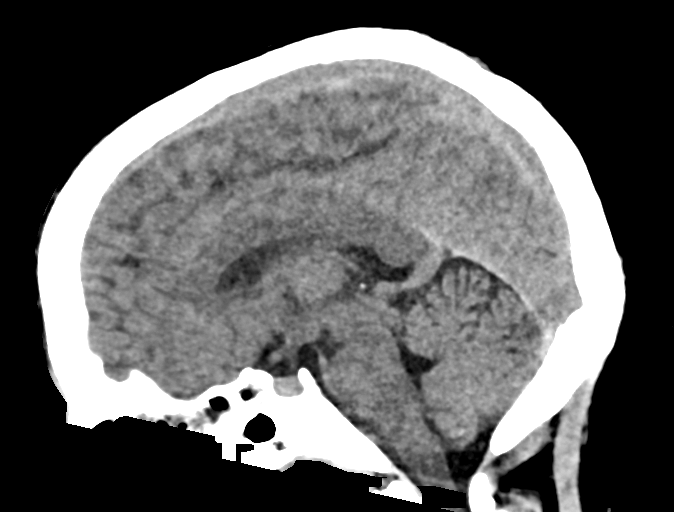
[im 37/55  brain]
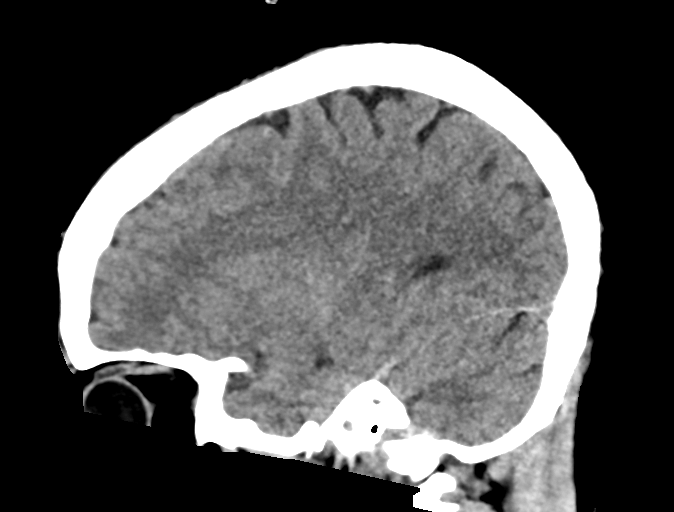

[16 of 47 positions shown; findings below may reference images not displayed]

FINDINGS: Brain: No evidence of acute infarction, hemorrhage, hydrocephalus,
extra-axial collection or mass lesion/mass effect.

Vascular: No hyperdense vessel or unexpected calcification.

Skull: The calvarium and skull base are intact. No focal lesion is
seen the bony calvarium. There is a small scalp hematoma in the
lateral left frontal area.

Sinuses/Orbits: There is broad-based depressed fracture in the
medial wall the right orbit with scattered extraconal air pockets,
intraconal stranding edema and asymmetric right ocular proptosis.
The orbital floor is not included in the exam. The visualized left
orbit is unremarkable. There is hemorrhagic opacification of
multiple right ethmoid air cells secondary to the depressed lamina
papyracea fracture. Other visible sinuses, mastoid air cells are
clear.

Other: None.
IMPRESSION: 1. No acute intracranial CT findings or depressed skull fractures.
Left lower lateral frontal small scalp hematoma.
2. Depressed fracture of the medial wall right orbit with air
pockets in the extraconal right orbit and moderate stranding edema
in the intraconal orbit, with asymmetric right ocular proptosis. The
orbital floor is not included in the exam. There is secondary
hemorrhagic opacification of multiple adjacent right ethmoid air
cells.

## 2023-09-05 IMAGING — CT CT MAXILLOFACIAL W/O CM
3 of 6 series · 14 of 47 positions shown, 17 images · non-contrast
Comparison: Similar study 08/16/2015

CLINICAL DATA: Facial trauma.



[Series 3: maxilllofacial 2.0 hr40 3 · axial · 0.34mm/px · z∈[-209,-75]mm · 9 of 79 slices shown, 12 images]
[im 6/79  brain]
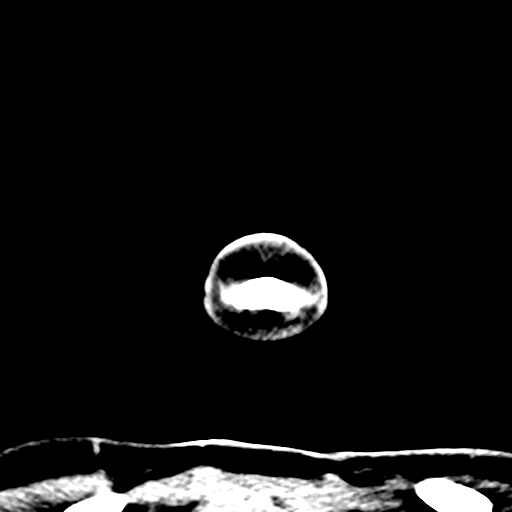
[im 6/79  bone]
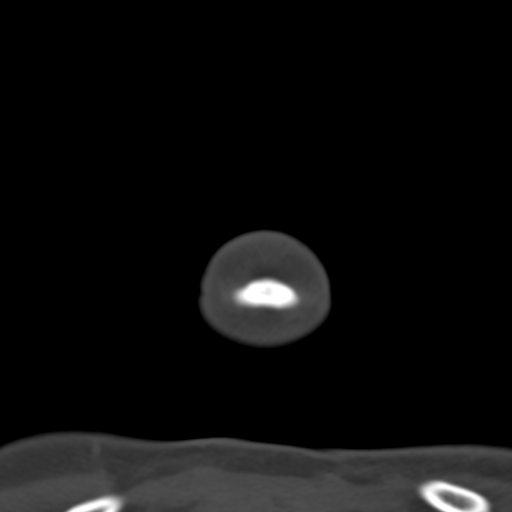
[im 17/79  bone]
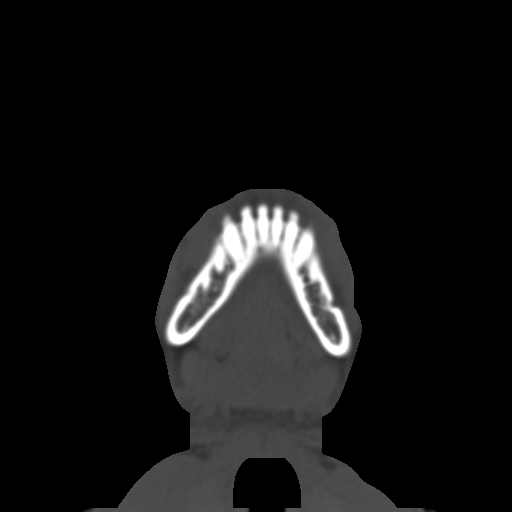
[im 23/79  bone]
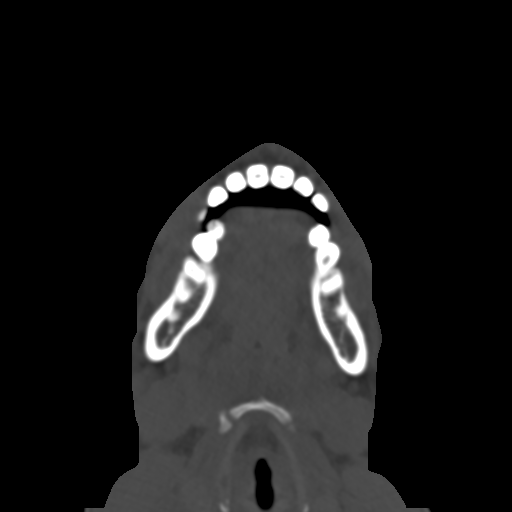
[im 34/79  bone]
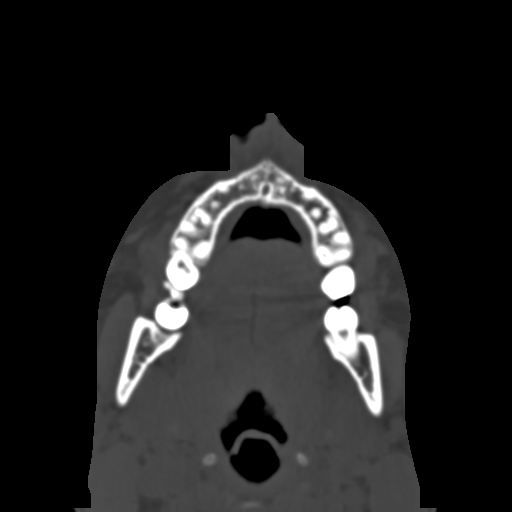
[im 40/79  brain]
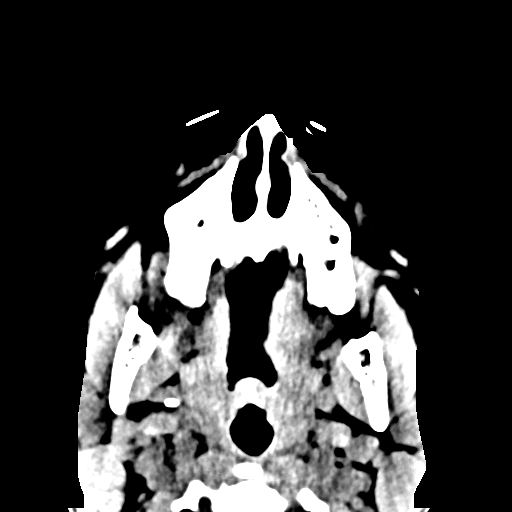
[im 40/79  bone]
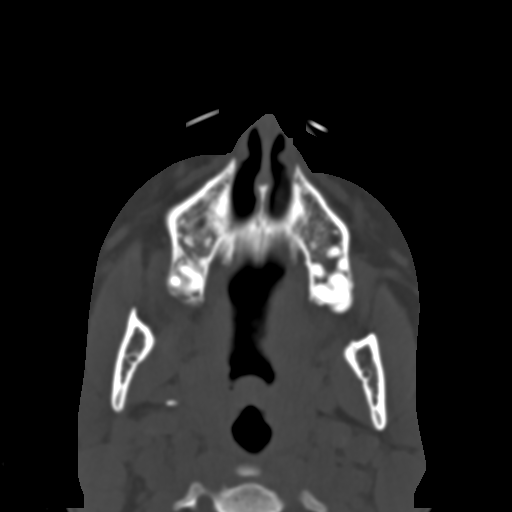
[im 45/79  bone]
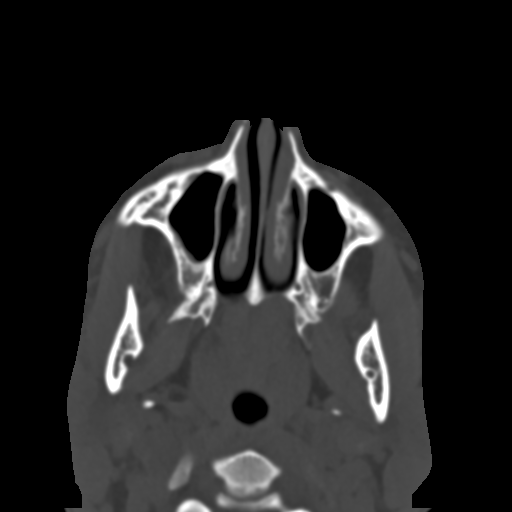
[im 56/79  bone]
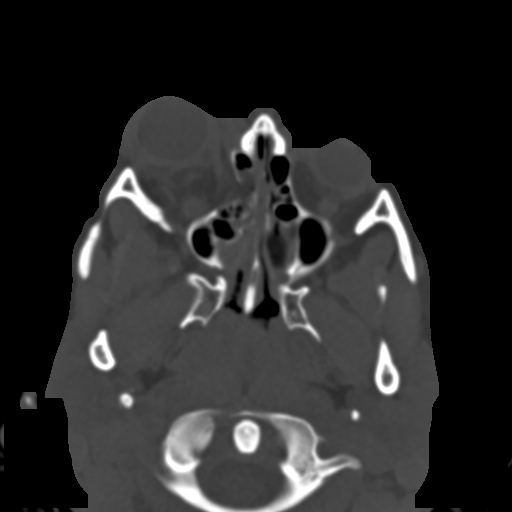
[im 62/79  bone]
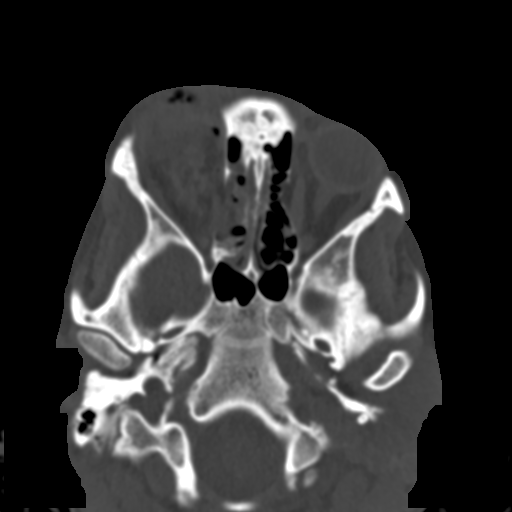
[im 73/79  brain]
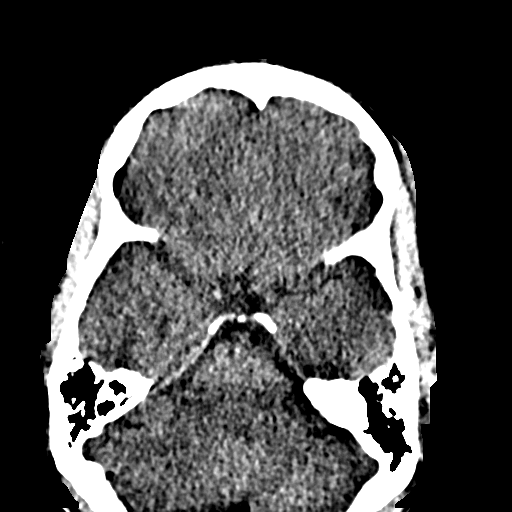
[im 73/79  bone]
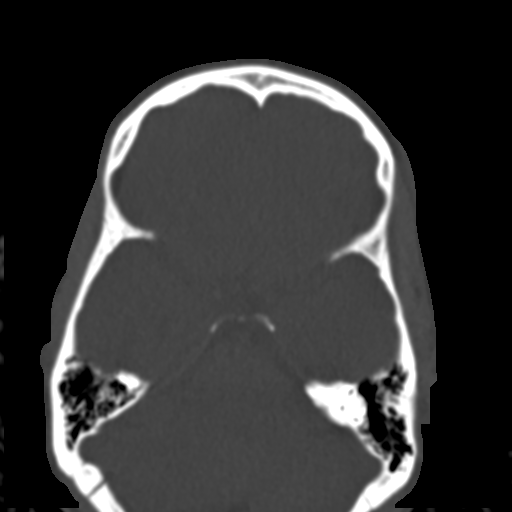

[Series 7: st cor · coronal · 0.30mm/px · 3 of 76 slices shown]
[im 19/76  bone]
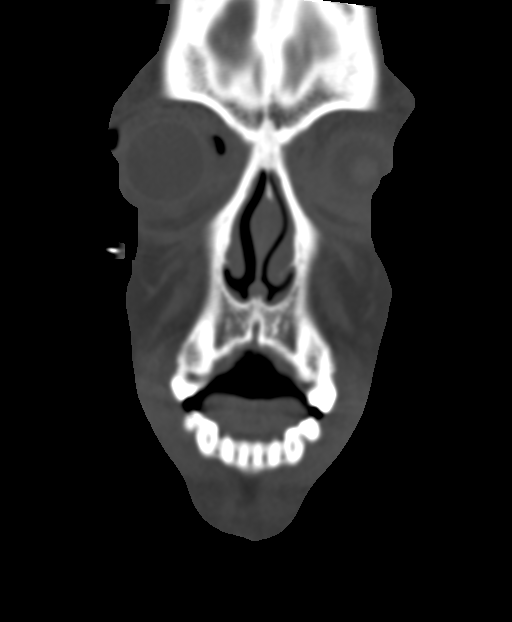
[im 38/76  bone]
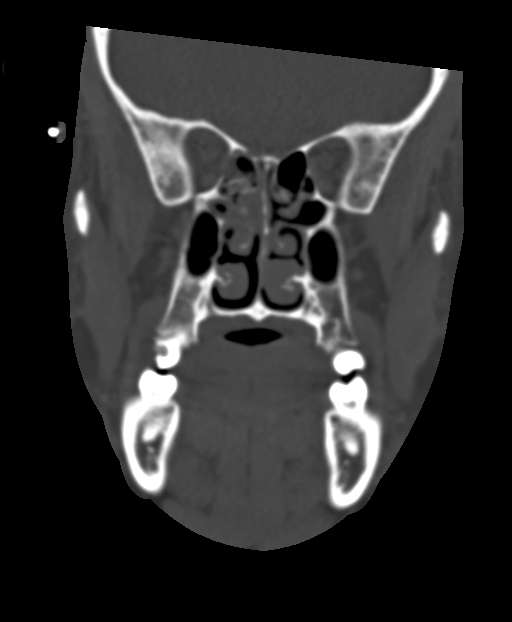
[im 57/76  bone]
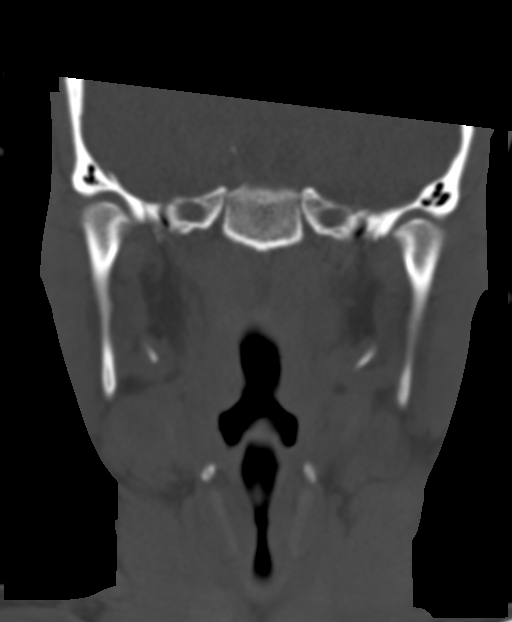

[Series 10: bone sag · sagittal · 0.29mm/px · 2 of 77 slices shown]
[im 26/77  bone]
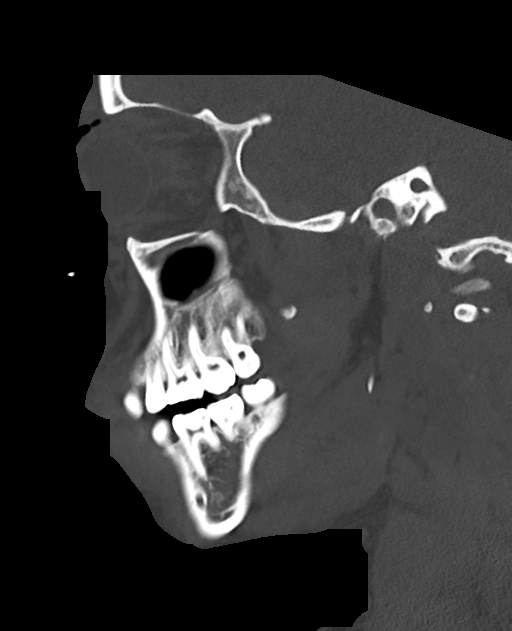
[im 51/77  bone]
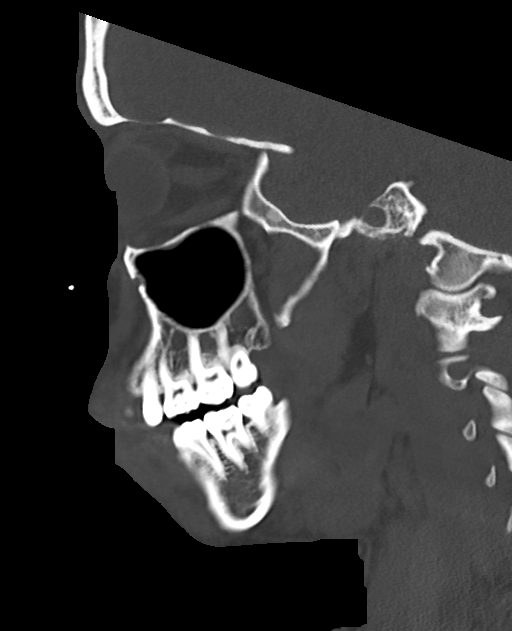

[14 of 47 positions shown; findings below may reference images not displayed]

FINDINGS: Osseous, orbits: Sizable broad-based depressed fracture medial wall
right orbit. Additional smaller fracture of the anteromedial orbital
floor with slight depression but no hemorrhagic fluid level in the
right maxillary sinus.

There is scattered extraconal free air in the anterior, medial and
superior right orbit, intraconal stranding edema changes surrounding
the optic nerve and asymmetric right-sided proptosis without
extraocular muscle entrapment inferiorly but with extension of the
medial rectus into the fracture defect. No intraorbital displacing
fluid collection is seen.

New from the prior study there is small chronic depressed fracture
in the medial wall the left orbit without acute injury to the left
orbit being seen.

Remainder both orbits, remaining facial bones, mandible and teeth
are intact. No mandibular dislocation is seen. Nasal septum deviates
slightly left with unremarkable turbinates.

Sinuses: There is multifocal hemorrhagic opacification of the right
ethmoid air cells due to the depressed lamina papyracea fracture.
There is slight membrane thickening in the maxillary sinuses. Other
visible sinuses, mastoid air cells are clear as well as the
bilateral middle ear cavities. The frontal sinus did not develop in
this patient.

Soft tissues: There is a small scalp hematoma in the lower lateral
left frontal area. There is swelling in the right preorbital soft
tissues. There are prominent adenoids effacing the posterior
nasopharynx and prominent palatine tonsils, but this was seen
previously and seems unchanged.

Limited intracranial: No significant or unexpected finding.
IMPRESSION: 1. Sizable broad-based fracture medial wall right orbit with
hemorrhagic opacification of multiple adjacent right ethmoid air
cells.
2. Very slightly depressed, much smaller anteromedial right orbit
floor fracture.
3. Interval new finding of a small depressed fracture in the medial
wall left orbit new from 6156 but appears chronic.
4. Small lower lateral left frontal scalp hematoma with swelling
over the right preorbital soft tissues, and air pockets in the
extraconal right orbit with stranding edema surrounding the right
optic nerve in the intraconal space and with asymmetric right ocular
proptosis and the medial rectus muscle extending into the fracture
defect.
5. Prominent adenoids and palatine tonsils, similar to the 6156
exam.

## 2023-09-05 IMAGING — CT CT CERVICAL SPINE W/O CM
3 of 4 series · 13 of 33 positions shown, 16 images · non-contrast
Comparison: None.

CLINICAL DATA: Head and face trauma.  Neck pain.



[Series 4: orthogonal axials · axial · 0.21mm/px · z∈[-219,-134]mm · 5 of 82 slices shown, 7 images]
[im 12/82  soft-tissue]
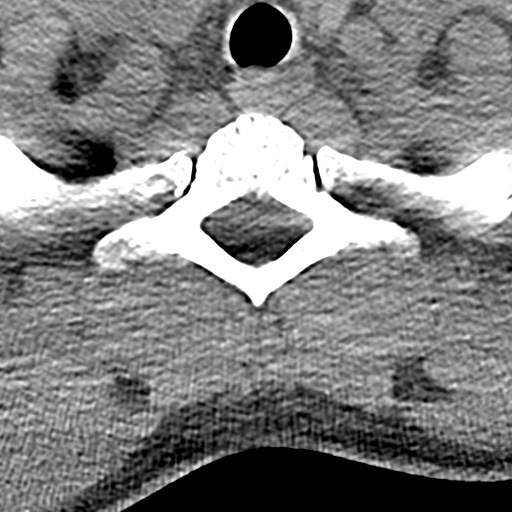
[im 12/82  bone]
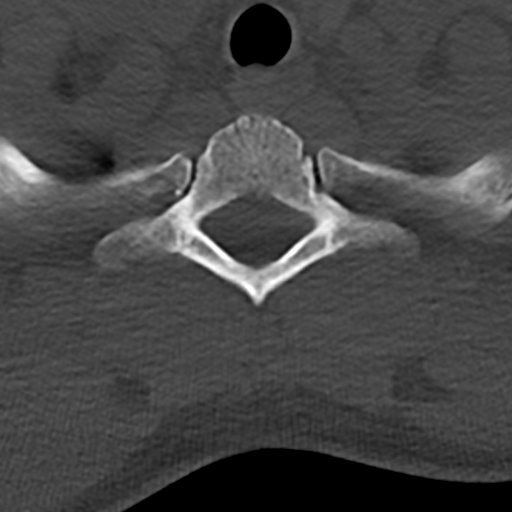
[im 24/82  bone]
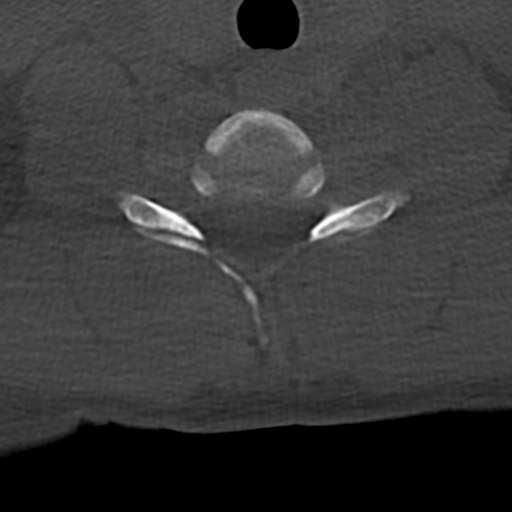
[im 47/82  bone]
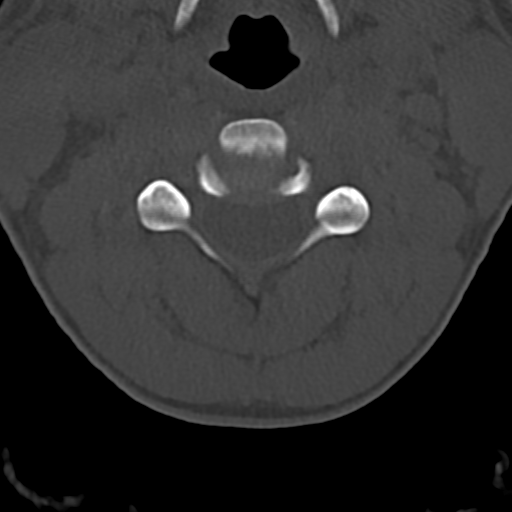
[im 58/82  bone]
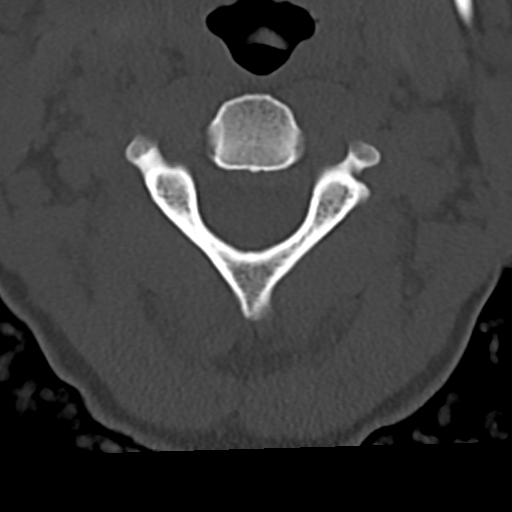
[im 70/82  soft-tissue]
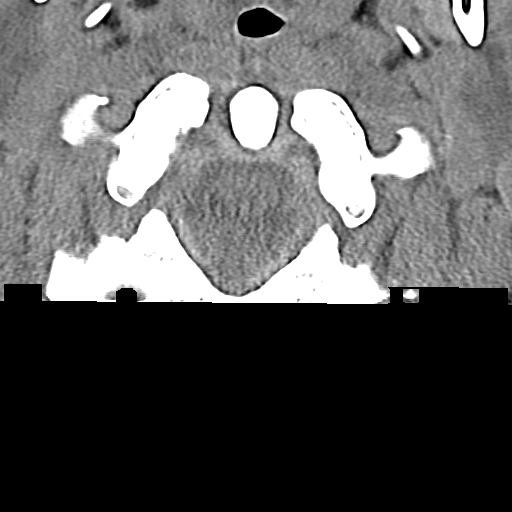
[im 70/82  bone]
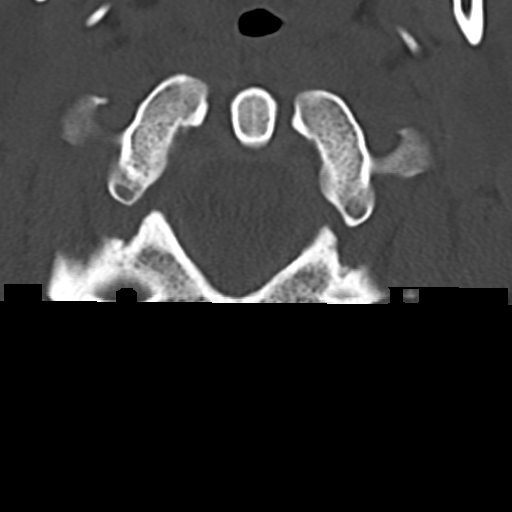

[Series 9: sag bone · sagittal · 0.25mm/px · 5 of 48 slices shown, 6 images]
[im 16/48  bone]
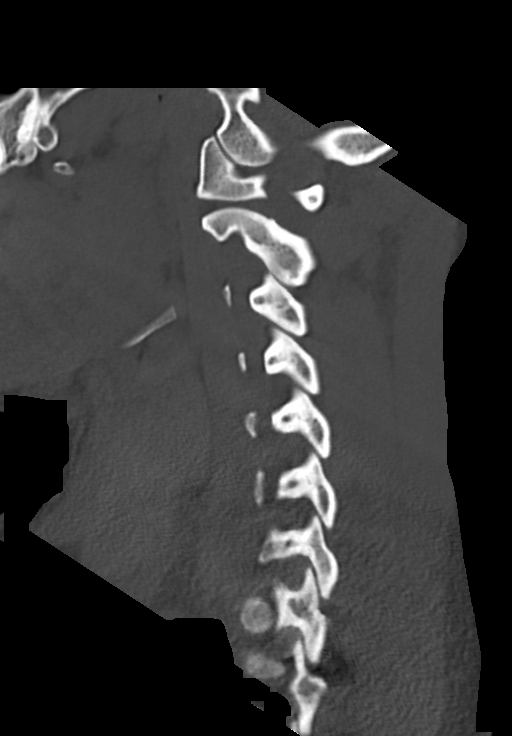
[im 20/48  bone]
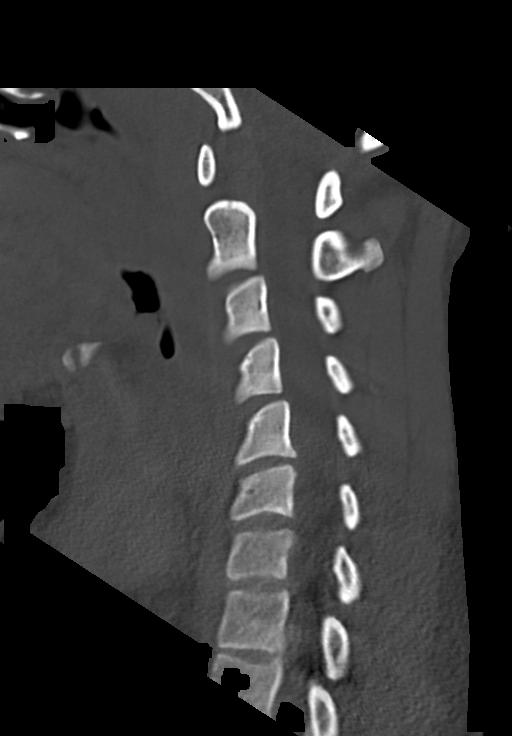
[im 24/48  soft-tissue]
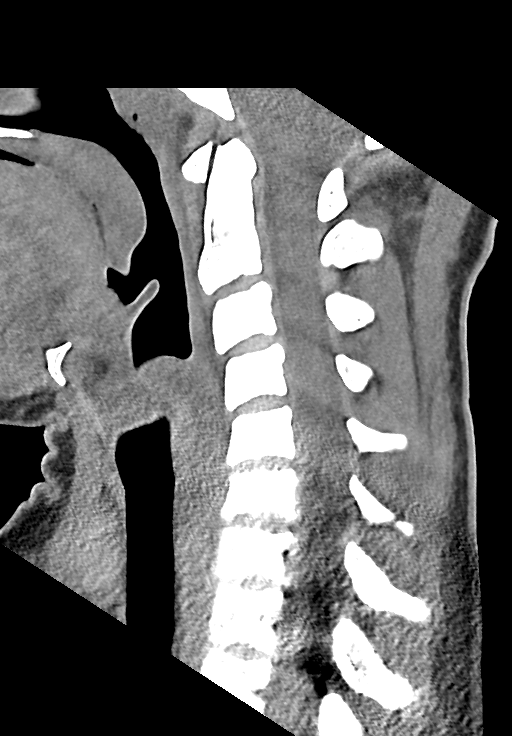
[im 24/48  bone]
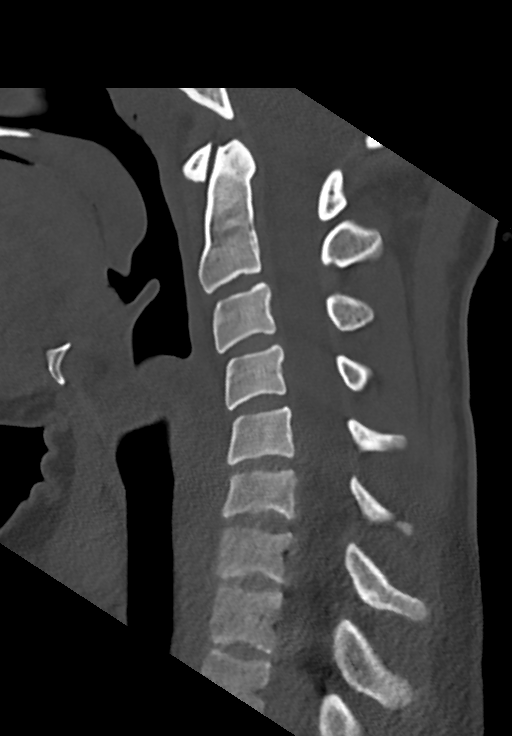
[im 28/48  bone]
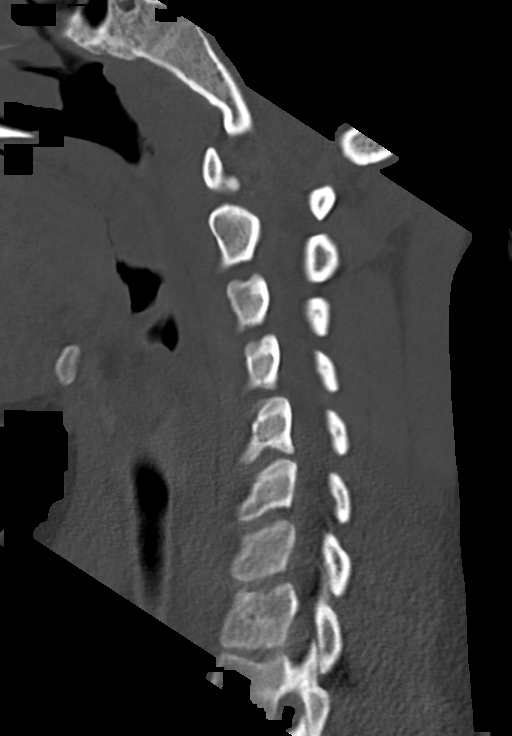
[im 32/48  bone]
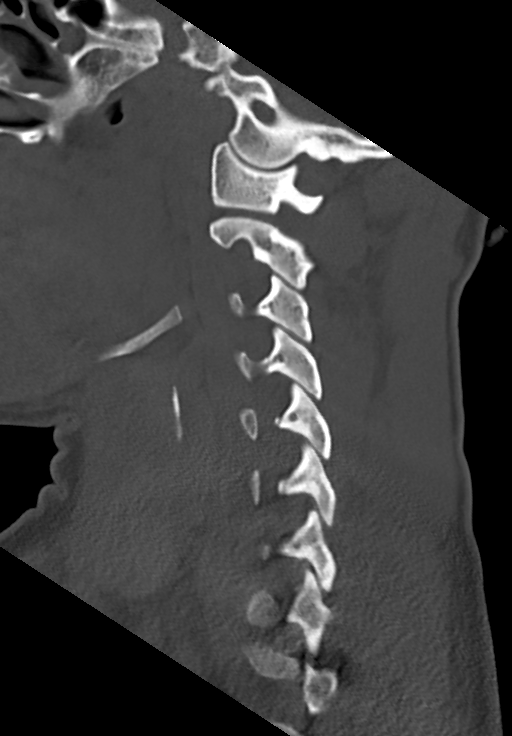

[Series 10: cor bone · coronal · 0.28mm/px · 3 of 56 slices shown]
[im 15/56  bone]
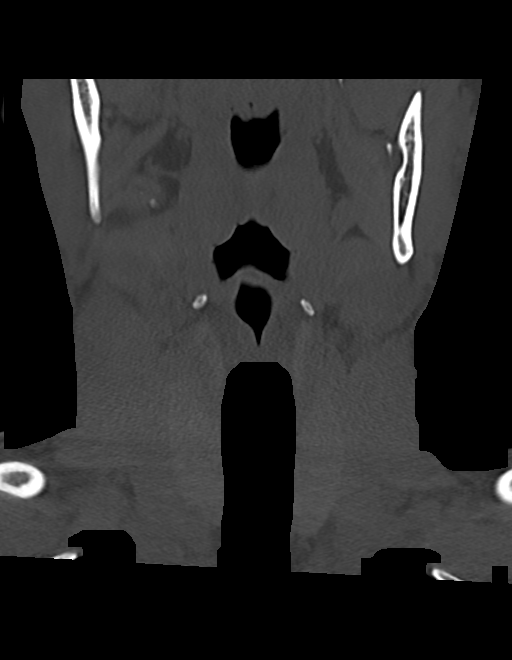
[im 24/56  bone]
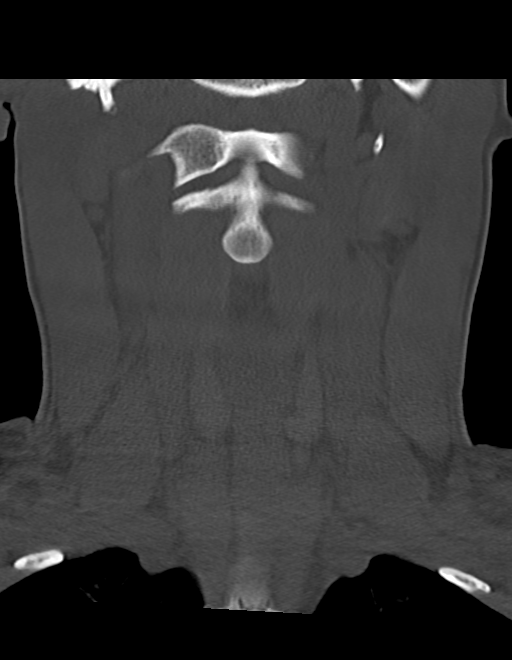
[im 33/56  bone]
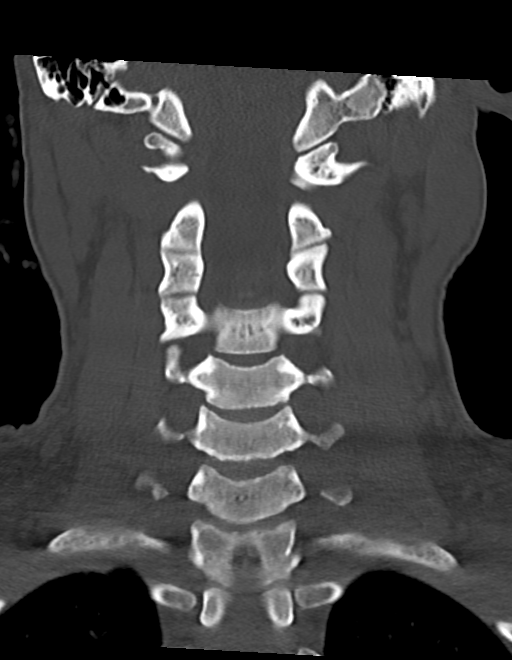

[13 of 33 positions shown; findings below may reference images not displayed]

FINDINGS: Alignment: There is reversed lordosis. Alignment is otherwise normal
including at the anterior atlantodental joint.

Skull base and vertebrae: No fracture or primary bone lesion is
seen. There is normal bone mineralization. Spina bifida occulta
incidentally noted at C5 and 6. The skull base, visualized mastoid
air cells are unremarkable.

Soft tissues and spinal canal: No prevertebral fluid or swelling. No
visible canal hematoma. No herniated discs or cord compromise are
observed. There are prominent adenoids and palatine tonsils with no
underlying fluid collections , no epiglottic thickening.

Disc levels: There is preservation of the normal vertebral body and
disc heights all cervical levels. There are no appreciable
significant disc bulges or herniations. Arthritic changes are not
seen. The foramina appear clear.

Upper chest: Negative.

Other: None.
IMPRESSION: Reversed lordosis without evidence of fracture, listhesis or
degenerative change. Prominent adenoids and palatine tonsils.

## 2023-09-14 ENCOUNTER — Ambulatory Visit: Payer: Self-pay

## 2023-09-24 ENCOUNTER — Ambulatory Visit
Admission: EM | Admit: 2023-09-24 | Discharge: 2023-09-24 | Disposition: A | Discharge status: Home / Self Care | Payer: Medicaid Other | Attending: Internal Medicine | Admitting: Internal Medicine

## 2023-09-24 DIAGNOSIS — J069 Acute upper respiratory infection, unspecified: Secondary | ICD-10-CM | POA: Insufficient documentation

## 2023-09-24 DIAGNOSIS — Z1152 Encounter for screening for COVID-19: Secondary | ICD-10-CM | POA: Diagnosis not present

## 2023-09-24 DIAGNOSIS — B9789 Other viral agents as the cause of diseases classified elsewhere: Secondary | ICD-10-CM | POA: Insufficient documentation

## 2023-09-24 DIAGNOSIS — N898 Other specified noninflammatory disorders of vagina: Secondary | ICD-10-CM

## 2023-09-24 DIAGNOSIS — Z113 Encounter for screening for infections with a predominantly sexual mode of transmission: Secondary | ICD-10-CM | POA: Diagnosis not present

## 2023-09-24 DIAGNOSIS — N760 Acute vaginitis: Secondary | ICD-10-CM | POA: Insufficient documentation

## 2023-09-24 DIAGNOSIS — B9689 Other specified bacterial agents as the cause of diseases classified elsewhere: Secondary | ICD-10-CM | POA: Diagnosis not present

## 2023-09-24 LAB — POCT URINE PREGNANCY: Preg Test, Ur: NEGATIVE

## 2023-09-24 MED ORDER — CETIRIZINE HCL 10 MG PO TABS: 10.0000 mg | ORAL_TABLET | Freq: Every day | ORAL | 0 refills | Status: DC

## 2023-09-24 MED ORDER — IBUPROFEN 600 MG PO TABS: 600.0000 mg | ORAL_TABLET | Freq: Four times a day (QID) | ORAL | 0 refills | Status: DC | PRN

## 2023-09-24 MED ORDER — PSEUDOEPHEDRINE HCL 30 MG PO TABS: 30.0000 mg | ORAL_TABLET | Freq: Three times a day (TID) | ORAL | 0 refills | Status: DC | PRN

## 2023-09-24 MED ORDER — METRONIDAZOLE 500 MG PO TABS: 500.0000 mg | ORAL_TABLET | Freq: Two times a day (BID) | ORAL | 0 refills | Status: DC

## 2023-09-24 MED ORDER — FLUCONAZOLE 150 MG PO TABS: 150.0000 mg | ORAL_TABLET | ORAL | 0 refills | Status: DC

## 2023-09-24 NOTE — Discharge Instructions (Addendum)
Your pregnancy test was negative. Start Flagyl for a suspected BV infection. Can also start fluconazole for an antibiotic associated yeast infection. We will update you with your results as they come back to use over the next 24-72 hours.    Also will manage your respiratory symptoms as a viral illness. For sore throat or cough try using a honey-based tea. Use 3 teaspoons of honey with juice squeezed from half lemon. Place shaved pieces of ginger into 1/2-1 cup of water and warm over stove top. Then mix the ingredients and repeat every 4 hours as needed. Please take ibuprofen 600mg  every 6 hours with food for throat pain, fevers, aches and pains. Hydrate very well with at least 2 liters of water. Eat light meals such as soups (chicken and noodles, vegetable, chicken and wild rice).  Do not eat foods that you are allergic to.  Taking an antihistamine like Zyrtec (10mg  daily) can help against postnasal drainage, sinus congestion which can cause sinus pain, sinus headaches, throat pain, painful swallowing, coughing.  You can take this together with pseudoephedrine (Sudafed) at a dose of 30mg  3 times a day or twice daily as needed for the same kind of nasal drip, congestion.

## 2023-09-24 NOTE — ED Provider Notes (Signed)
[facility]  Note:  This document was prepared using Conservation officer, historic buildings and may include unintentional dictation errors.  MRN: [id number] DOB: Sep 14, 1996  Subjective:   Darlene Ortiz is a 27 y.o. female presenting for multiple concerns.   Reports 1 week history of persistent vaginal discharge, vaginal malodor.  Has a history of BV infection.  Has previously gotten yeast infection as well but not as often.  No urinary symptoms.  Would like sexually-transmitted infection testing.  Patient is currently on her cycle.  Would like pregnancy test.  Reports 4-day history of fatigue, malaise, sinus congestion and drainage.  Has had some coughing.  No overt chest pain, shortness of breath or wheezing.  No smoking of any kind including cigarettes, cigars, vaping, marijuana use.    No current facility-administered medications for this encounter.  Current Outpatient Medications:    Drospirenone (SLYND) 4 MG TABS, Take 1 tablet (4 mg total) by mouth daily., Disp: 84 tablet, Rfl: 4   ibuprofen (ADVIL) 800 MG tablet, Take 1 tablet (800 mg total) by mouth every 8 (eight) hours as needed. (Patient not taking: Reported on 05/09/2023), Disp: 30 tablet, Rfl: 1   metroNIDAZOLE (FLAGYL) 500 MG tablet, Take 1 tablet (500 mg total) by mouth 2 (two) times daily., Disp: 14 tablet, Rfl: 0   NIFEdipine (PROCARDIA XL) 30 MG 24 hr tablet, Take 1 tablet (30 mg total) by mouth in the morning and at bedtime. (Patient not taking: Reported on 05/09/2023), Disp: 60 tablet, Rfl: 1   Prenatal 28-0.8 MG TABS, Take 1 tablet by mouth daily. (Patient not taking: Reported on 05/09/2023), Disp: 30 tablet, Rfl: 12   No Known Allergies  Past Medical History:  Diagnosis Date   Syphilis 11/2022     Past Surgical History:  Procedure Laterality Date   TOOTH EXTRACTION      Family History  Problem Relation Age of Onset   Diabetes Mother    Breast cancer Maternal Grandmother     Social History    Tobacco Use   Smoking status: Never   Smokeless tobacco: Never  Vaping Use   Vaping status: Never Used  Substance Use Topics   Alcohol use: Not Currently    Comment: occassionally   Drug use: Not Currently    Types: Marijuana    Comment: May 2021    ROS   Objective:   Vitals: BP 127/75 (BP Location: Right Arm)   Pulse (!) 101   Temp 98.6 F (37 C) (Oral)   Resp 18   SpO2 98%   Physical Exam Constitutional:      General: She is not in acute distress.    Appearance: Normal appearance. She is well-developed and normal weight. She is not ill-appearing, toxic-appearing or diaphoretic.  HENT:     Head: Normocephalic and atraumatic.     Right Ear: Tympanic membrane, ear canal and external ear normal. No drainage or tenderness. No middle ear effusion. There is no impacted cerumen. Tympanic membrane is not erythematous or bulging.     Left Ear: Tympanic membrane, ear canal and external ear normal. No drainage or tenderness.  No middle ear effusion. There is no impacted cerumen. Tympanic membrane is not erythematous or bulging.     Nose: Nose normal. No congestion or rhinorrhea.     Mouth/Throat:     Mouth: Mucous membranes are moist. No oral lesions.     Pharynx: No pharyngeal swelling, oropharyngeal exudate, posterior oropharyngeal erythema or uvula swelling.  Tonsils: No tonsillar exudate or tonsillar abscesses.  Eyes:     General: No scleral icterus.       Right eye: No discharge.        Left eye: No discharge.     Extraocular Movements: Extraocular movements intact.     Right eye: Normal extraocular motion.     Left eye: Normal extraocular motion.     Conjunctiva/sclera: Conjunctivae normal.  Cardiovascular:     Rate and Rhythm: Normal rate and regular rhythm.     Heart sounds: Normal heart sounds. No murmur heard.    No friction rub. No gallop.  Pulmonary:     Effort: Pulmonary effort is normal. No respiratory distress.     Breath sounds: No stridor. No  wheezing, rhonchi or rales.  Chest:     Chest wall: No tenderness.  Musculoskeletal:     Cervical back: Normal range of motion and neck supple.  Lymphadenopathy:     Cervical: No cervical adenopathy.  Skin:    General: Skin is warm and dry.  Neurological:     General: No focal deficit present.     Mental Status: She is alert and oriented to person, place, and time.  Psychiatric:        Mood and Affect: Mood normal.        Behavior: Behavior normal.     Urine pregnancy test negative.  Assessment and Plan :   PDMP not reviewed this encounter.  1. Bacterial vaginosis   2. Viral upper respiratory infection   3. Screening for STD (sexually transmitted disease)   4. Vaginal discharge    We will treat patient empirically for bacterial vaginosis with Flagyl and for yeast vaginitis with fluconazole.  Will manage for viral illness such as viral URI, viral syndrome, viral rhinitis, COVID-19. Recommended supportive care. Offered scripts for symptomatic relief. Testing is pending. Deferred imaging given clear cardiopulmonary exam, hemodynamically stable vital signs. Counseled patient on potential for adverse effects with medications prescribed/recommended today, ER and return-to-clinic precautions discussed, patient verbalized understanding.     Wallis Bamberg, [place] 09/25/23 [phone]

## 2023-09-24 NOTE — ED Triage Notes (Signed)
Pt reports vaginal odor x 1 week, pt think she has BV. Requested STD's test.   Reports nasal congestion, fatigue x 4 days.

## 2023-09-25 LAB — CERVICOVAGINAL ANCILLARY ONLY
Bacterial Vaginitis (gardnerella): POSITIVE — AB
Candida Glabrata: NEGATIVE
Candida Vaginitis: NEGATIVE
Chlamydia: NEGATIVE
Comment: NEGATIVE
Comment: NEGATIVE
Comment: NEGATIVE
Comment: NEGATIVE
Comment: NEGATIVE
Comment: NORMAL
Neisseria Gonorrhea: NEGATIVE
Trichomonas: NEGATIVE

## 2023-09-25 LAB — SARS CORONAVIRUS 2 (TAT 6-24 HRS): SARS Coronavirus 2: NEGATIVE

## 2023-09-26 LAB — HIV ANTIBODY (ROUTINE TESTING W REFLEX): HIV Screen 4th Generation wRfx: NONREACTIVE

## 2023-09-27 LAB — RPR: RPR Ser Ql: REACTIVE — AB

## 2023-09-27 LAB — RPR, QUANT+TP ABS (REFLEX)
Rapid Plasma Reagin, Quant: 1:4 {titer} — ABNORMAL HIGH
T Pallidum Abs: REACTIVE — AB

## 2023-09-28 ENCOUNTER — Ambulatory Visit
Admission: EM | Admit: 2023-09-28 | Discharge: 2023-09-28 | Discharge status: Against Medical Advice | Payer: Medicaid Other | Attending: Urgent Care | Admitting: Urgent Care

## 2023-09-28 MED ORDER — PENICILLIN G BENZATHINE 1200000 UNIT/2ML IM SUSY: 2.4000 10*6.[IU] | PREFILLED_SYRINGE | Freq: Once | INTRAMUSCULAR | Status: DC

## 2023-10-02 ENCOUNTER — Encounter: Payer: Self-pay | Admitting: Family Medicine

## 2023-10-02 ENCOUNTER — Other Ambulatory Visit: Payer: Self-pay

## 2023-10-02 ENCOUNTER — Ambulatory Visit: Payer: Medicaid Other | Admitting: Family Medicine

## 2023-10-02 VITALS — BP 129/88 | HR 80 | Ht 67.0 in | Wt 180.0 lb

## 2023-10-02 DIAGNOSIS — Z3480 Encounter for supervision of other normal pregnancy, unspecified trimester: Secondary | ICD-10-CM | POA: Diagnosis not present

## 2023-10-02 DIAGNOSIS — Z23 Encounter for immunization: Secondary | ICD-10-CM

## 2023-10-02 DIAGNOSIS — A5149 Other secondary syphilitic conditions: Secondary | ICD-10-CM | POA: Diagnosis not present

## 2023-10-02 MED ORDER — PENICILLIN G BENZATHINE 1200000 UNIT/2ML IM SUSY
2.4000 10*6.[IU] | PREFILLED_SYRINGE | Freq: Once | INTRAMUSCULAR | Status: AC
  Administered 2023-10-02: 2.4 10*6.[IU] via INTRAMUSCULAR

## 2023-10-02 NOTE — Assessment & Plan Note (Addendum)
2x rise in titer consistent with re-exposure. <1 year since adequate treatment, only requires treatment x1.

## 2023-10-02 NOTE — Progress Notes (Signed)
   [facility] OFFICE VISIT NOTE  History:   Darlene Ortiz is a 27 y.o. G2P1011 here today for concern for syphilis re-exposure.  Patient diagnosed and treated during pregnancy Had titer fall and was undetectable in July On October 21 patient was tested and RPR was reactive and had risen to 1:4 Patient concerned partner has been unfaithful She requests treatment today   There are no preventive care reminders to display for this patient.  Past Medical History:  Diagnosis Date   Syphilis 11/2022    Past Surgical History:  Procedure Laterality Date   TOOTH EXTRACTION      The following portions of the patient's history were reviewed and updated as appropriate: allergies, current medications, past family history, past medical history, past social history, past surgical history and problem list.   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  Physical Exam:  BP 129/88   Pulse 80   Ht 5\' 7"  (1.702 m)   Wt 180 lb (81.6 kg)   LMP 09/19/2023 (Exact Date)   Breastfeeding No   BMI 28.19 kg/m  CONSTITUTIONAL: Well-developed, well-nourished female in no acute distress.  HEENT:  Normocephalic, atraumatic. External right and left ear normal. No scleral icterus.  NECK: Normal range of motion, supple, no masses noted on observation SKIN: No rash noted. Not diaphoretic. No erythema. No pallor. MUSCULOSKELETAL: Normal range of motion. No edema noted. NEUROLOGIC: Alert and oriented to person, place, and time. Normal muscle tone coordination.  PSYCHIATRIC: Normal mood and affect. Normal behavior. Normal judgment and thought  content. RESPIRATORY: Effort normal, no problems with respiration noted   Labs and Imaging No results found for this or any previous visit (from the past 168 hour(s)). No results found.    Assessment and Plan:   Problem List Items Addressed This Visit       Other   Secondary syphilis, relapse (treated) - Primary    2x rise in titer consistent with re-exposure. <1 year since adequate treatment, only requires treatment x1.      RESOLVED: Supervision of other normal pregnancy, antepartum   Relevant Orders   Flu vaccine trivalent PF, 6mos and older(Flulaval,Afluria,Fluarix,Fluzone) (Completed)    Routine preventative health maintenance measures emphasized. Please refer to After Visit Summary for other counseling recommendations.   Total face-to-face time with patient: 20 minutes.  Over 50% of encounter was spent on counseling and coordination of care.   Venora Maples, MD/MPH Attending Family Medicine Physician, [facility] for [facility], [facility]

## 2023-10-09 ENCOUNTER — Telehealth: Payer: Self-pay | Admitting: Obstetrics and Gynecology

## 2023-10-09 MED ORDER — FLUCONAZOLE 150 MG PO TABS: 150.0000 mg | ORAL_TABLET | Freq: Once | ORAL | 1 refills | Status: AC

## 2023-10-09 NOTE — Telephone Encounter (Signed)
Telephone call, rx sent for diflucan

## 2023-10-18 ENCOUNTER — Encounter: Payer: Self-pay | Admitting: Family Medicine

## 2023-10-18 MED ORDER — METRONIDAZOLE 500 MG PO TABS: 500.0000 mg | ORAL_TABLET | Freq: Two times a day (BID) | ORAL | 0 refills | Status: DC

## 2023-11-10 ENCOUNTER — Ambulatory Visit
Admission: EM | Admit: 2023-11-10 | Discharge: 2023-11-10 | Disposition: A | Discharge status: Home / Self Care | Payer: Medicaid Other | Attending: Family Medicine | Admitting: Family Medicine

## 2023-11-10 DIAGNOSIS — Z113 Encounter for screening for infections with a predominantly sexual mode of transmission: Secondary | ICD-10-CM | POA: Diagnosis not present

## 2023-11-10 DIAGNOSIS — Z8619 Personal history of other infectious and parasitic diseases: Secondary | ICD-10-CM | POA: Insufficient documentation

## 2023-11-10 LAB — POCT URINE PREGNANCY: Preg Test, Ur: NEGATIVE

## 2023-11-10 NOTE — ED Triage Notes (Addendum)
Pt reports exposure to syphilis-also requesting STD testing-denies vaginal d/c-NAD-steady gait-pt added she wants a preg test

## 2023-11-10 NOTE — ED Provider Notes (Signed)
[facility]  Note:  This document was prepared using Conservation officer, historic buildings and may include unintentional dictation errors.  MRN: [id number] DOB: September 10, 1996  Subjective:   Darlene Ortiz is a 27 y.o. female presenting for STI screening. Patient has significant concern about having an ongoing sexually-transmitted infection.  She was treated for syphilis in October.  Her sex partner was also treated days later.  He presents to the clinic as well.  She is insisting that he has other sex partners.  He denies this.  He does continue to have symptoms of dysuria.  Has had occasional penile discharge.  He has undergone multiple treatments including IM ceftriaxone, azithromycin and his penicillin injection for treatment of syphilis.  All testing has been negative except for syphilis.  The patient herself denies fever, n/v, abdominal pain, pelvic pain, rashes, dysuria, urinary frequency, hematuria, vaginal discharge.    No current facility-administered medications for this encounter.  Current Outpatient Medications:    cetirizine (ZYRTEC ALLERGY) 10 MG tablet, Take 1 tablet (10 mg total) by mouth daily., Disp: 30 tablet, Rfl: 0   Drospirenone (SLYND) 4 MG TABS, Take 1 tablet (4 mg total) by mouth daily., Disp: 84 tablet, Rfl: 4   metroNIDAZOLE (FLAGYL) 500 MG tablet, Take 1 tablet (500 mg total) by mouth 2 (two) times daily., Disp: 14 tablet, Rfl: 0   No Known Allergies  Past Medical History:  Diagnosis Date   Syphilis 11/2022     Past Surgical History:  Procedure Laterality Date   TOOTH EXTRACTION      Family History  Problem Relation Age of Onset   Diabetes Mother    Breast cancer Maternal Grandmother     Social History   Tobacco Use   Smoking status: Never   Smokeless tobacco: Never  Vaping Use   Vaping status: Never Used  Substance Use Topics   Alcohol use: Yes    Comment: occassionally   Drug use: Not Currently    Types: Marijuana     ROS   Objective:   Vitals: BP 131/75 (BP Location: Right Arm)   Pulse 96   Temp 98.4 F (36.9 C) (Oral)   Resp 20   LMP 10/15/2023   SpO2 100%   Physical Exam Constitutional:      General: She is not in acute distress.    Appearance: Normal appearance. She is well-developed. She is not ill-appearing, toxic-appearing or diaphoretic.  HENT:     Head: Normocephalic and atraumatic.     Nose: Nose normal.     Mouth/Throat:     Mouth: Mucous membranes are moist.  Eyes:     General: No scleral icterus.       Right eye: No discharge.        Left eye: No discharge.     Extraocular Movements: Extraocular movements intact.  Cardiovascular:     Rate and Rhythm: Normal rate.  Pulmonary:     Effort: Pulmonary effort is normal.  Skin:    General: Skin is warm and dry.  Neurological:     General: No focal deficit present.     Mental Status: She is alert and oriented to person, place, and time.  Psychiatric:        Mood and Affect: Mood normal.        Behavior: Behavior normal.     Assessment and Plan :   PDMP not reviewed this encounter.  1. Screen for STD (sexually transmitted disease)   2. History  of syphilis    Patient insisted on getting retested for HIV, syphilis, vaginal cytology.  She is asymptomatic.  I have reviewed extensively with the patient the significance of these tests, interpretation of results.  I do not recommend testing for HIV every month.  However, the patient insisted.  Testing pending, will treat as appropriate based off of positive results.   Wallis Bamberg, PA-C 11/10/23 [id number]

## 2023-11-11 ENCOUNTER — Ambulatory Visit: Payer: Self-pay

## 2023-11-12 LAB — CERVICOVAGINAL ANCILLARY ONLY
Chlamydia: NEGATIVE
Comment: NEGATIVE
Comment: NEGATIVE
Comment: NORMAL
Neisseria Gonorrhea: NEGATIVE
Trichomonas: NEGATIVE

## 2023-11-14 LAB — RPR, QUANT+TP ABS (REFLEX)
Rapid Plasma Reagin, Quant: 1:2 {titer} — ABNORMAL HIGH
T Pallidum Abs: REACTIVE — AB

## 2023-11-14 LAB — RPR: RPR Ser Ql: REACTIVE — AB

## 2023-11-14 LAB — HIV ANTIBODY (ROUTINE TESTING W REFLEX): HIV Screen 4th Generation wRfx: NONREACTIVE

## 2023-11-15 ENCOUNTER — Ambulatory Visit
Admission: EM | Admit: 2023-11-15 | Discharge: 2023-11-15 | Disposition: A | Discharge status: Home / Self Care | Payer: Medicaid Other | Attending: Internal Medicine | Admitting: Internal Medicine

## 2023-11-15 DIAGNOSIS — N760 Acute vaginitis: Secondary | ICD-10-CM | POA: Diagnosis present

## 2023-11-15 DIAGNOSIS — B9689 Other specified bacterial agents as the cause of diseases classified elsewhere: Secondary | ICD-10-CM

## 2023-11-15 MED ORDER — FLUCONAZOLE 150 MG PO TABS: 150.0000 mg | ORAL_TABLET | ORAL | 0 refills | Status: DC

## 2023-11-15 MED ORDER — METRONIDAZOLE 500 MG PO TABS: 500.0000 mg | ORAL_TABLET | Freq: Two times a day (BID) | ORAL | 0 refills | Status: DC

## 2023-11-15 NOTE — ED Provider Notes (Signed)
[facility]  Note:  This document was prepared using Conservation officer, historic buildings and may include unintentional dictation errors.  MRN: [id number] DOB: January 07, 1996  Subjective:   Darlene Ortiz is a 27 y.o. female presenting for 5 day history of white vaginal discharge and vaginal itching.  No urinary symptoms.  Patient has significant concern for syphilis still and is requesting treatment with penicillin.  At her visit on 11/10/2023, she had a negative pregnancy test.  Declines a repeat test today.  No current facility-administered medications for this encounter.  Current Outpatient Medications:    cetirizine (ZYRTEC ALLERGY) 10 MG tablet, Take 1 tablet (10 mg total) by mouth daily., Disp: 30 tablet, Rfl: 0   Drospirenone (SLYND) 4 MG TABS, Take 1 tablet (4 mg total) by mouth daily., Disp: 84 tablet, Rfl: 4   metroNIDAZOLE (FLAGYL) 500 MG tablet, Take 1 tablet (500 mg total) by mouth 2 (two) times daily., Disp: 14 tablet, Rfl: 0   No Known Allergies  Past Medical History:  Diagnosis Date   Syphilis 11/2022     Past Surgical History:  Procedure Laterality Date   TOOTH EXTRACTION      Family History  Problem Relation Age of Onset   Diabetes Mother    Breast cancer Maternal Grandmother     Social History   Tobacco Use   Smoking status: Never   Smokeless tobacco: Never  Vaping Use   Vaping status: Never Used  Substance Use Topics   Alcohol use: Yes    Comment: occassionally   Drug use: Not Currently    Types: Marijuana    ROS   Objective:   Vitals: BP (!) 154/96 (BP Location: Left Arm)   Pulse 81   Temp 98.7 F (37.1 C) (Oral)   Resp 16   LMP 10/15/2023 (Exact Date)   SpO2 97%   Breastfeeding No   Physical Exam Constitutional:      General: She is not in acute distress.    Appearance: Normal appearance. She is well-developed. She is not ill-appearing, toxic-appearing or diaphoretic.  HENT:     Head: Normocephalic and  atraumatic.     Nose: Nose normal.     Mouth/Throat:     Mouth: Mucous membranes are moist.  Eyes:     General: No scleral icterus.       Right eye: No discharge.        Left eye: No discharge.     Extraocular Movements: Extraocular movements intact.  Cardiovascular:     Rate and Rhythm: Normal rate.  Pulmonary:     Effort: Pulmonary effort is normal.  Skin:    General: Skin is warm and dry.  Neurological:     General: No focal deficit present.     Mental Status: She is alert and oriented to person, place, and time.  Psychiatric:        Mood and Affect: Mood normal.        Behavior: Behavior normal.        Thought Content: Thought content normal.        Judgment: Judgment normal.    Recent Results (from the past 2160 hours)  Cervicovaginal ancillary only     Status: Abnormal   Collection Time: 09/24/23  6:58 PM  Result Value Ref Range   Neisseria Gonorrhea Negative    Chlamydia Negative    Trichomonas Negative    Bacterial Vaginitis (gardnerella) Positive (A)    Candida Vaginitis Negative  Candida Glabrata Negative    Comment      Normal Reference Range Bacterial Vaginosis - Negative   Comment Normal Reference Range Candida Species - Negative    Comment Normal Reference Range Candida Galbrata - Negative    Comment Normal Reference Range Trichomonas - Negative    Comment Normal Reference Ranger Chlamydia - Negative    Comment      Normal Reference Range Neisseria Gonorrhea - Negative  RPR     Status: Abnormal   Collection Time: 09/24/23  7:16 PM  Result Value Ref Range   RPR Ser Ql Reactive (A) Non Reactive  HIV Antibody (routine testing w rflx)     Status: None   Collection Time: 09/24/23  7:16 PM  Result Value Ref Range   HIV Screen 4th Generation wRfx Non Reactive Non Reactive    Comment: HIV-1/HIV-2 antibodies and HIV-1 p24 antigen were NOT detected. There is no laboratory evidence of HIV infection. HIV Negative   RPR, quant & T.pallidum antibodies      Status: Abnormal   Collection Time: 09/24/23  7:16 PM  Result Value Ref Range   Rapid Plasma Reagin, Quant 1:4 (H) NonRea<1:1 titer   T Pallidum Abs Reactive (A) Non Reactive   Interpretation: Comment     Comment: Syphilis: RPR with Reflex to RPR Titer and Treponemal           Antibodies, Traditional Screening and Diagnosis           Algorithm ------------------------------------------------------------                        Treponemal RPR        RPR, Qn         Ab       Final Interpretation --------   ---------   ----------   ------------------------ Non        N/A         N/A          No laboratory evidence Reactive                            of syphilis. Retest in                                     2-4 weeks if recent                                     exposure [place] suspected. --------   ---------   ----------   ------------------------ Reactive   >/=1:1      Non          Nontreponemal antibodies                        Reactive     detected. Syphilis                                     unlikely; biological                                     false positive possible.  Retest in 2-4 weeks if                                     recent exposure  is                                     suspected. --------   ---------   ----------   ------------------------ Reactive   >/=1:1      Reactive     Treponemal and                                     nontreponemal antibodies                                     detected. Consistent                                     with past or current                                     (potential early)                                     syphilis.   POCT urine pregnancy     Status: None   Collection Time: 09/24/23  7:23 PM  Result Value Ref Range   Preg Test, Ur Negative Negative  SARS CORONAVIRUS 2 (TAT 6-24 HRS) Anterior Nasal Swab     Status: None   Collection Time: 09/24/23  7:26 PM   Specimen: Anterior Nasal  Swab  Result Value Ref Range   SARS Coronavirus 2 NEGATIVE NEGATIVE    Comment: (NOTE) SARS-CoV-2 target nucleic acids are NOT DETECTED.  The SARS-CoV-2 RNA is generally detectable in upper and lower respiratory specimens during the acute phase of infection. Negative results do not preclude SARS-CoV-2 infection, do not rule out co-infections with other pathogens, and should not be used as the sole basis for treatment or other patient management decisions. Negative results must be combined with clinical observations, patient history, and epidemiological information. The expected result is Negative.  Fact Sheet for Patients: HairSlick.no  Fact Sheet for Healthcare Providers: quierodirigir.com  This test is not yet approved or cleared by the [place] FDA and  has been authorized for detection and/or diagnosis of SARS-CoV-2 by FDA under an Emergency Use Authorization (EUA). This EUA will remain  in effect (meaning this test can be used) for the duration of the COVID-19 declaration under Se ction 564(b)(1) of the Act, 21 U.S.C. section 360bbb-3(b)(1), unless the authorization is terminated or revoked sooner.  Performed at [facility], [address] [place], [place] [address]   RPR     Status: Abnormal   Collection Time: 11/10/23 10:15 AM  Result Value Ref Range   RPR Ser Ql Reactive (A) Non Reactive  HIV Antibody (routine testing w rflx)     Status: None   Collection Time: 11/10/23  10:15 AM  Result Value Ref Range   HIV Screen 4th Generation wRfx Non Reactive Non Reactive    Comment: HIV-1/HIV-2 antibodies and HIV-1 p24 antigen were NOT detected. There is no laboratory evidence of HIV infection. HIV Negative   RPR, quant & T.pallidum antibodies     Status: Abnormal   Collection Time: 11/10/23 10:15 AM  Result Value Ref Range   Rapid Plasma Reagin, Quant 1:2 (H) NonRea<1:1 titer   T Pallidum Abs Reactive (A)  Non Reactive   Interpretation: Comment     Comment: Syphilis: RPR with Reflex to RPR Titer and Treponemal           Antibodies, Traditional Screening and Diagnosis           Algorithm ------------------------------------------------------------                        Treponemal RPR        RPR, Qn         Ab       Final Interpretation --------   ---------   ----------   ------------------------ Non        N/A         N/A          No laboratory evidence Reactive                            of syphilis. Retest in                                     2-4 weeks if recent                                     exposure [place] suspected. --------   ---------   ----------   ------------------------ Reactive   >/=1:1      Non          Nontreponemal antibodies                        Reactive     detected. Syphilis                                     unlikely; biological                                     false positive possible.                                     Retest in 2-4 weeks if                                     recent exposure  is                                     suspected. --------   ---------   ----------   ------------------------ Reactive   >/=  1:1      Reactive     Treponemal and                                     nontreponemal antibodies                                     detected. Consistent                                     with past or current                                     (potential early)                                     syphilis.   POCT urine pregnancy     Status: None   Collection Time: 11/10/23 10:23 AM  Result Value Ref Range   Preg Test, Ur Negative Negative  Cervicovaginal ancillary only     Status: None   Collection Time: 11/10/23 10:23 AM  Result Value Ref Range   Neisseria Gonorrhea Negative    Chlamydia Negative    Trichomonas Negative    Comment Normal Reference Range Trichomonas - Negative    Comment Normal Reference Ranger Chlamydia - Negative     Comment      Normal Reference Range Neisseria Gonorrhea - Negative     Assessment and Plan :   PDMP not reviewed this encounter.  1. Bacterial vaginosis   2. Acute vaginitis    We will treat patient empirically for bacterial vaginosis with Flagyl and for yeast vaginitis with fluconazole.  Labs pending. I advised that she talk with the Health Department regarding her concerns about getting multiple treatments for syphilis.  I have reviewed all of her lab results extensively and multiple times at each of her visits.  I do not recommend another treatment for syphilis.  I did advise that she seek a second opinion with the health department.  Counseled patient on potential for adverse effects with medications prescribed/recommended today, ER and return-to-clinic precautions discussed, patient verbalized understanding.    Wallis Bamberg, [place] 11/15/23 1610

## 2023-11-15 NOTE — Discharge Instructions (Addendum)
I highly recommend that you go to the Health Department for retesting and consideration for treatment of syphilis. I do not recommend it as you are not showing signs of failed treatment response. However, I understand you concern that you still have the infection, concerns for re-exposure and I believe you could get your second opinion from the Health Department.   [facility] health department [address]  [phone]

## 2023-11-15 NOTE — ED Triage Notes (Signed)
Pt report\s white vaginal discharge and vaginal itching x 5 days. Pt requested test for BV and yeast infection.

## 2023-11-16 LAB — CERVICOVAGINAL ANCILLARY ONLY
Bacterial Vaginitis (gardnerella): POSITIVE — AB
Candida Glabrata: NEGATIVE
Candida Vaginitis: POSITIVE — AB
Comment: NEGATIVE
Comment: NEGATIVE
Comment: NEGATIVE

## 2024-01-15 ENCOUNTER — Ambulatory Visit
Admission: EM | Admit: 2024-01-15 | Discharge: 2024-01-15 | Disposition: A | Discharge status: Home / Self Care | Payer: Medicaid Other | Attending: Physician Assistant | Admitting: Physician Assistant

## 2024-01-15 ENCOUNTER — Other Ambulatory Visit: Payer: Self-pay

## 2024-01-15 DIAGNOSIS — Z113 Encounter for screening for infections with a predominantly sexual mode of transmission: Secondary | ICD-10-CM | POA: Insufficient documentation

## 2024-01-15 NOTE — ED Provider Notes (Signed)
[facility]    CSN: [id number] Arrival date & time: 01/15/24  1813      History   Chief Complaint Chief Complaint  Patient presents with   Vaginal Discharge    HPI Darlene Ortiz is a 28 y.o. female.   Patient here today for evaluation of vaginal discharge.  She would like STD screening including blood work. She denies any abdominal pain, back pain, genital lesions. She has had no known STD exposure.   The history is provided by the patient.    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 Authorizing Provider  cetirizine (ZYRTEC ALLERGY) 10 MG tablet Take 1 tablet (10 mg total) by mouth daily. 09/24/23  Yes Wallis Bamberg, PA-C  Drospirenone (SLYND) 4 MG TABS Take 1 tablet (4 mg total) by mouth daily. 04/09/23  Yes Venora Maples, MD  fluconazole (DIFLUCAN) 150 MG tablet Take 1 tablet (150 mg total) by mouth every 3 (three) days. Patient not taking: Reported on 01/15/2024 11/15/23   Wallis Bamberg, PA-C  metroNIDAZOLE (FLAGYL) 500 MG tablet Take 1 tablet (500 mg total) by mouth 2 (two) times daily with a meal. DO NOT CONSUME ALCOHOL WHILE TAKING THIS MEDICATION. Patient not taking: Reported on 01/15/2024 11/15/23   Wallis Bamberg, PA-C    Family History Family History  Problem Relation Age of  Onset   Diabetes Mother    Breast cancer Maternal Grandmother     Social History Social History   Tobacco Use   Smoking status: Never   Smokeless tobacco: Never  Vaping Use   Vaping status: Never Used  Substance Use Topics   Alcohol use: Yes    Comment: occassionally   Drug use: Not Currently    Types: Marijuana     Allergies   Patient has no known allergies.   Review of Systems Review of Systems  Constitutional:  Negative for chills and fever.  Eyes:  Negative for discharge and redness.  Respiratory:  Negative for shortness of breath.   Gastrointestinal:  Negative for abdominal pain, nausea and vomiting.  Genitourinary:  Positive for vaginal discharge. Negative for dysuria, genital sores and vaginal bleeding.     Physical Exam Triage Vital Signs ED Triage Vitals  Encounter Vitals Group     BP      Systolic BP Percentile      Diastolic BP Percentile      Pulse      Resp      Temp      Temp src  SpO2      Weight      Height      Head Circumference      Peak Flow      Pain Score      Pain Loc      Pain Education      Exclude from Growth Chart    No data found.  Updated Vital Signs BP 127/88 (BP Location: Left Arm)   Pulse 80   Temp 98.5 F (36.9 C) (Oral)   Resp 16   LMP 12/22/2023 (Approximate)   SpO2 98%   Breastfeeding No   Visual Acuity Right Eye Distance:   Left Eye Distance:   Bilateral Distance:    Right Eye Near:   Left Eye Near:    Bilateral Near:     Physical Exam Vitals and nursing note reviewed.  Constitutional:      General: She is not in acute distress.    Appearance: Normal appearance. She is not ill-appearing.  HENT:     Head: Normocephalic and atraumatic.  Eyes:     Conjunctiva/sclera: Conjunctivae normal.  Cardiovascular:     Rate and Rhythm: Normal rate.  Pulmonary:     Effort: Pulmonary effort is normal. No respiratory distress.  Neurological:     Mental Status: She is alert.  Psychiatric:        Mood  and Affect: Mood normal.        Behavior: Behavior normal.        Thought Content: Thought content normal.      UC Treatments / Results  Labs (all labs ordered are listed, but only abnormal results are displayed) Labs Reviewed  RPR  HIV ANTIBODY (ROUTINE TESTING W REFLEX)  HEPATITIS PANEL, ACUTE  CERVICOVAGINAL ANCILLARY ONLY    EKG   Radiology No results found.  Procedures Procedures (including critical care time)  Medications Ordered in UC Medications - No data to display  Initial Impression / Assessment and Plan / UC Course  I have reviewed the triage vital signs and the nursing notes.  Pertinent labs & imaging results that were available during my care of the patient were reviewed by me and considered in my medical decision making (see chart for details).    STD screening ordered as requested. Will await results for further recommendation. encouraged follow up with nay further concerns.   Final Clinical Impressions(s) / UC Diagnoses   Final diagnoses:  Screening for STD (sexually transmitted disease)   Discharge Instructions   None    ED Prescriptions   None    PDMP not reviewed this encounter.   Tomi Bamberger, PA-C 01/16/24 [phone]

## 2024-01-15 NOTE — ED Triage Notes (Addendum)
Pt reports vaginal discharge with odor x 5 days. She would like STI testing, swab and blood draw.

## 2024-01-16 ENCOUNTER — Encounter: Payer: Self-pay | Admitting: Physician Assistant

## 2024-01-16 ENCOUNTER — Other Ambulatory Visit: Payer: Self-pay

## 2024-01-16 LAB — CERVICOVAGINAL ANCILLARY ONLY
Bacterial Vaginitis (gardnerella): POSITIVE — AB
Candida Glabrata: NEGATIVE
Candida Vaginitis: NEGATIVE
Chlamydia: NEGATIVE
Comment: NEGATIVE
Comment: NEGATIVE
Comment: NEGATIVE
Comment: NEGATIVE
Comment: NEGATIVE
Comment: NORMAL
Neisseria Gonorrhea: NEGATIVE
Trichomonas: NEGATIVE

## 2024-01-17 ENCOUNTER — Telehealth: Payer: Self-pay

## 2024-01-17 LAB — RPR, QUANT+TP ABS (REFLEX)
Rapid Plasma Reagin, Quant: 1:2 {titer} — ABNORMAL HIGH
T Pallidum Abs: REACTIVE — AB

## 2024-01-17 LAB — HIV ANTIBODY (ROUTINE TESTING W REFLEX): HIV Screen 4th Generation wRfx: NONREACTIVE

## 2024-01-17 LAB — RPR: RPR Ser Ql: REACTIVE — AB

## 2024-01-17 MED ORDER — METRONIDAZOLE 500 MG PO TABS: 500.0000 mg | ORAL_TABLET | Freq: Two times a day (BID) | ORAL | 0 refills | Status: AC

## 2024-01-17 NOTE — Telephone Encounter (Signed)
Per protocol, pt requires tx with metronidazole. Rx sent to pharmacy on file.

## 2024-01-26 LAB — ACUTE VIRAL HEPATITIS (HAV, HBV, HCV)
HCV Ab: NONREACTIVE
Hep A IgM: NEGATIVE
Hep B C IgM: NEGATIVE
Hepatitis B Surface Ag: NEGATIVE

## 2024-01-26 LAB — HCV INTERPRETATION

## 2024-01-26 LAB — SPECIMEN STATUS REPORT

## 2024-02-28 ENCOUNTER — Ambulatory Visit
Admission: RE | Admit: 2024-02-28 | Discharge: 2024-02-28 | Disposition: A | Discharge status: Home / Self Care | Source: Ambulatory Visit | Attending: Family Medicine | Admitting: Family Medicine

## 2024-02-28 ENCOUNTER — Ambulatory Visit: Payer: Self-pay

## 2024-02-28 VITALS — BP 120/76 | HR 83 | Temp 98.0°F | Resp 16 | Ht 67.0 in | Wt 200.0 lb

## 2024-02-28 DIAGNOSIS — N760 Acute vaginitis: Secondary | ICD-10-CM | POA: Insufficient documentation

## 2024-02-28 DIAGNOSIS — N898 Other specified noninflammatory disorders of vagina: Secondary | ICD-10-CM | POA: Insufficient documentation

## 2024-02-28 MED ORDER — METRONIDAZOLE 0.75 % VA GEL
1.0000 | Freq: Every day | VAGINAL | 0 refills | Status: AC
Start: 2024-02-28 — End: 2024-03-04

## 2024-02-28 NOTE — Discharge Instructions (Signed)
 Test results will be released to your MyChart account We will contact you if anything is positive and requires treatment.

## 2024-02-28 NOTE — ED Triage Notes (Signed)
 Pt states white vaginal discharge for one week.

## 2024-02-28 NOTE — ED Provider Notes (Signed)
 [facility]    CSN: [id number] Arrival date & time: 02/28/24  1808      History   Chief Complaint Chief Complaint  Patient presents with   Vaginal Discharge    Entered by patient    HPI Darlene Ortiz is a 28 y.o. female.    Vaginal Discharge Discharge and odor started last week then she got her normal menstrual cycle and symptoms returned after her cycle finished.  She admits white discharge with odor.  Admits mild irritation.  Has had BV multiple times in the past.  Denies unprotected sex.  Denies abdominal pain, nausea, vomiting, fever, chills, sweats, dysuria,urgency, hematuria, genital rash.  A leftover Diflucan without resolution of symptoms.   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 Authorizing Provider  metroNIDAZOLE (METROGEL) 0.75 % vaginal gel Place 1 Applicatorful vaginally at bedtime for 5 days. 02/28/24 03/04/24 Yes Meliton Rattan, PA  cetirizine (ZYRTEC ALLERGY) 10 MG tablet Take 1 tablet (10 mg total) by mouth daily. 09/24/23   Wallis Bamberg, PA-C  Drospirenone (SLYND) 4 MG TABS Take 1 tablet (4 mg total) by mouth daily. 04/09/23   Venora Maples, MD  fluconazole (DIFLUCAN) 150 MG tablet Take 1 tablet (150 mg total) by mouth every 3 (three) days. Patient not taking:  Reported on 01/15/2024 11/15/23   Wallis Bamberg, PA-C  metroNIDAZOLE (FLAGYL) 500 MG tablet Take 1 tablet (500 mg total) by mouth 2 (two) times daily with a meal. DO NOT CONSUME ALCOHOL WHILE TAKING THIS MEDICATION. Patient not taking: Reported on 01/15/2024 11/15/23   Wallis Bamberg, PA-C    Family History Family History  Problem Relation Age of Onset   Diabetes Mother    Breast cancer Maternal Grandmother     Social History Social History   Tobacco Use   Smoking status: Never   Smokeless tobacco: Never  Vaping Use   Vaping status: Never Used  Substance Use Topics   Alcohol use: Yes    Comment: occassionally   Drug use: Not Currently    Types: Marijuana     Allergies   Patient has no known allergies.   Review of Systems Review of Systems  Genitourinary:  Positive for vaginal discharge.     Physical Exam Triage Vital Signs ED Triage Vitals  Encounter Vitals Group     BP 02/28/24 1815 120/76     Systolic BP Percentile --      Diastolic BP Percentile --      Pulse Rate 02/28/24 1815 83  Resp 02/28/24 1815 16     Temp 02/28/24 1815 98 F (36.7 C)     Temp Source 02/28/24 1815 Oral     SpO2 02/28/24 1815 98 %     Weight 02/28/24 1815 200 lb (90.7 kg)     Height 02/28/24 1815 5\' 7"  (1.702 m)     Head Circumference --      Peak Flow --      Pain Score 02/28/24 1833 0     Pain Loc --      Pain Education --      Exclude from Growth Chart --    No data found.  Updated Vital Signs BP 120/76 (BP Location: Right Arm)   Pulse 83   Temp 98 F (36.7 C) (Oral)   Resp 16   Ht 5\' 7"  (1.702 m)   Wt 200 lb (90.7 kg)   LMP 02/23/2024 (Exact Date)   SpO2 98%   Breastfeeding No   BMI 31.32 kg/m   Visual Acuity Right Eye Distance:   Left Eye Distance:   Bilateral Distance:    Right Eye Near:   Left Eye Near:    Bilateral Near:     Physical Exam Vitals and nursing note reviewed.  HENT:     Head: Normocephalic.     Right Ear: Tympanic membrane normal.      Left Ear: Tympanic membrane normal.     Nose: No rhinorrhea.     Mouth/Throat:     Mouth: Mucous membranes are moist.  Eyes:     Conjunctiva/sclera: Conjunctivae normal.  Cardiovascular:     Rate and Rhythm: Regular rhythm.     Heart sounds: Normal heart sounds.  Pulmonary:     Effort: Pulmonary effort is normal.     Breath sounds: Normal breath sounds. No wheezing or rhonchi.  Abdominal:     Palpations: Abdomen is soft.     Tenderness: There is no abdominal tenderness. There is no guarding.  Musculoskeletal:     Cervical back: Neck supple.  Skin:    General: Skin is warm and dry.  Neurological:     Mental Status: She is alert and oriented to person, place, and time.      UC Treatments / Results  Labs (all labs ordered are listed, but only abnormal results are displayed) Labs Reviewed  CERVICOVAGINAL ANCILLARY ONLY    EKG   Radiology No results found.  Procedures Procedures (including critical care time)  Medications Ordered in UC Medications - No data to display  Initial Impression / Assessment and Plan / UC Course  I have reviewed the triage vital signs and the nursing notes.  Pertinent labs & imaging results that were available during my care of the patient were reviewed by me and considered in my medical decision making (see chart for details).     Striae frequent BV presents with symptoms of vaginal discharge and odor, she would prefer to use metronidazole gel rather than pills, she would like to be checked for chlamydia and gonorrhea but declined blood tests.  No known drug allergies. Final Clinical Impressions(s) / UC Diagnoses   Final diagnoses:  Vaginal discharge  Acute vaginitis     Discharge Instructions      Test results will be released to your MyChart account We will contact you if anything is positive and requires treatment.    ED Prescriptions     Medication Sig Dispense Auth. Provider   metroNIDAZOLE (METROGEL) 0.75 % vaginal gel  Place  1 Applicatorful vaginally at bedtime for 5 days. 70 g Meliton Rattan, [place]      PDMP not reviewed this encounter.   Meliton Rattan, [place] 02/28/24 1840

## 2024-03-03 LAB — CERVICOVAGINAL ANCILLARY ONLY
Bacterial Vaginitis (gardnerella): POSITIVE — AB
Candida Glabrata: NEGATIVE
Candida Vaginitis: NEGATIVE
Chlamydia: NEGATIVE
Comment: NEGATIVE
Comment: NEGATIVE
Comment: NEGATIVE
Comment: NEGATIVE
Comment: NEGATIVE
Comment: NORMAL
Neisseria Gonorrhea: NEGATIVE
Trichomonas: NEGATIVE

## 2024-03-31 ENCOUNTER — Other Ambulatory Visit: Payer: Self-pay

## 2024-03-31 ENCOUNTER — Ambulatory Visit
Admission: EM | Admit: 2024-03-31 | Discharge: 2024-03-31 | Disposition: A | Discharge status: Home / Self Care | Attending: Physician Assistant | Admitting: Physician Assistant

## 2024-03-31 DIAGNOSIS — Z113 Encounter for screening for infections with a predominantly sexual mode of transmission: Secondary | ICD-10-CM | POA: Diagnosis not present

## 2024-03-31 DIAGNOSIS — N898 Other specified noninflammatory disorders of vagina: Secondary | ICD-10-CM | POA: Insufficient documentation

## 2024-03-31 LAB — POCT URINALYSIS DIP (MANUAL ENTRY)
Bilirubin, UA: NEGATIVE
Blood, UA: NEGATIVE
Glucose, UA: NEGATIVE mg/dL
Ketones, POC UA: NEGATIVE mg/dL
Leukocytes, UA: NEGATIVE
Nitrite, UA: NEGATIVE
Protein Ur, POC: NEGATIVE mg/dL
Spec Grav, UA: 1.025 (ref 1.010–1.025)
Urobilinogen, UA: 0.2 U/dL
pH, UA: 6 (ref 5.0–8.0)

## 2024-03-31 LAB — POCT URINE PREGNANCY: Preg Test, Ur: NEGATIVE

## 2024-03-31 MED ORDER — METRONIDAZOLE 0.75 % VA GEL: 1.0000 | Freq: Every day | VAGINAL | 0 refills | Status: AC

## 2024-03-31 NOTE — ED Provider Notes (Signed)
 [facility]    CSN: [id number] Arrival date & time: 03/31/24  1900      History   Chief Complaint Chief Complaint  Patient presents with   Vaginal Discharge    HPI Darlene Ortiz is a 28 y.o. female.   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 Authorizing Provider  metroNIDAZOLE  (METROGEL ) 0.75 % vaginal gel Place 1 Applicatorful vaginally at bedtime for 5 days. 03/31/24 04/05/24 Yes Crystalle Popwell E, PA-C  cetirizine  (ZYRTEC  ALLERGY) 10 MG tablet Take 1 tablet (10 mg total) by mouth daily. 09/24/23   Adolph Hoop, PA-C  Drospirenone  (SLYND ) 4 MG TABS Take 1 tablet (4 mg total) by mouth daily. 04/09/23   Teena Feast, MD  fluconazole  (DIFLUCAN ) 150 MG tablet Take 1 tablet (150 mg total) by mouth every 3 (three) days. Patient not taking: Reported on 01/15/2024 11/15/23   Adolph Hoop, PA-C    Family History Family  History  Problem Relation Age of Onset   Diabetes Mother    Breast cancer Maternal Grandmother     Social History Social History   Tobacco Use   Smoking status: Never   Smokeless tobacco: Never  Vaping Use   Vaping status: Never Used  Substance Use Topics   Alcohol use: Yes    Comment: occassionally   Drug use: Not Currently    Types: Marijuana     Allergies   Patient has no known allergies.   Review of Systems Review of Systems  Constitutional:  Negative for chills and fever.  Gastrointestinal:  Negative for abdominal pain.  Genitourinary:  Positive for vaginal discharge. Negative for dysuria, vaginal bleeding and vaginal pain.     Physical Exam Triage Vital Signs ED Triage Vitals  Encounter Vitals Group     BP 03/31/24 1905 136/82     Systolic BP Percentile --      Diastolic BP Percentile --      Pulse Rate 03/31/24 1905 86     Resp 03/31/24 1905 16     Temp 03/31/24 1905 97.6 F (36.4 C)     Temp  Source 03/31/24 1905 Oral     SpO2 03/31/24 1905 99 %     Weight 03/31/24 1905 200 lb (90.7 kg)     Height 03/31/24 1905 5\' 7"  (1.702 m)     Head Circumference --      Peak Flow --      Pain Score 03/31/24 1922 0     Pain Loc --      Pain Education --      Exclude from Growth Chart --    No data found.  Updated Vital Signs BP 136/82 (BP Location: Right Arm)   Pulse 86   Temp 97.6 F (36.4 C) (Oral)   Resp 16   Ht 5\' 7"  (1.702 m)   Wt 200 lb (90.7 kg)   LMP 03/24/2024 (Exact Date)   SpO2 99%   Breastfeeding No   BMI 31.32 kg/m   Visual Acuity Right Eye Distance:   Left Eye Distance:   Bilateral Distance:    Right Eye Near:   Left Eye Near:    Bilateral Near:     Physical Exam Vitals reviewed.  Constitutional:      General: She is awake.     Appearance: Normal appearance. She is well-developed and well-groomed.  HENT:     Head: Normocephalic and atraumatic.  Eyes:     General: Lids are normal. Gaze aligned appropriately.      Extraocular Movements: Extraocular movements intact.     Conjunctiva/sclera: Conjunctivae normal.  Pulmonary:     Effort: Pulmonary effort is normal.  Neurological:     General: No focal deficit present.     Mental Status: She is alert and oriented to person, place, and time.     GCS: GCS eye subscore is 4. GCS verbal subscore is 5. GCS motor subscore is 6.     Cranial Nerves: No cranial nerve deficit, dysarthria or facial asymmetry.  Psychiatric:        Attention and Perception: Attention and perception normal.        Mood and Affect: Mood and affect normal.        Speech: Speech normal.        Behavior: Behavior normal. Behavior is cooperative.        Thought Content: Thought content normal.        Judgment: Judgment normal.      UC Treatments / Results  Labs (all labs ordered are listed, but only abnormal results are displayed) Labs Reviewed  POCT URINALYSIS DIP (MANUAL ENTRY) - Abnormal; Notable for the following components:      Result Value   Clarity, UA hazy (*)    All other components within normal limits  RPR  HIV ANTIBODY (ROUTINE TESTING W REFLEX)  POCT URINE PREGNANCY  CERVICOVAGINAL ANCILLARY ONLY    EKG   Radiology No results found.  Procedures Procedures (including critical care time)  Medications Ordered in UC Medications - No data to display  Initial Impression / Assessment and Plan / UC Course  I have reviewed the triage vital signs and the nursing notes.  Pertinent labs & imaging results that were available during my care of the patient were reviewed by me and considered in my medical decision making (see chart for details).      Final Clinical Impressions(s) / UC Diagnoses   Final diagnoses:  Vaginal discharge  Screening examination for STD (sexually transmitted disease)   Patient presents today with concerns for vaginal discharge changes that has been ongoing for about a  week.  She reports that this feels similar to previous experiences  with BV.  Cervicovaginal swab collected for BV, yeast, trichomonas, gonorrhea and chlamydia.  Patient also request blood work to assess for HIV and syphilis.  Urine dip was negative for signs of UTI or hematuria and urine pregnancy test was negative.  Reviewed with patient that since she has had a positive syphilis test in the past that this will likely show positive but we are interested in the titer for definitive diagnosis.  Patient voiced agreement understanding with this explanation.  Given symptoms and HPI are similar with previous BV we will go ahead and send in Flagyl  gel per patient request. Test results to dictate further management once available.  Return precautions reviewed and provided after visit summary.  Follow-up as needed.   Discharge Instructions      You were seen today for STD screening and vaginal discharge changes.  We collected a cervicovaginal swab that we will assess for gonorrhea, chlamydia, trichomonas, bacterial vaginosis, yeast.  If collected blood work that we will assess for HIV and syphilis.  We will keep you updated with these results once they are available.  If any medications are indicated by those test results we will call you and medications will either be sent to the pharmacy on file or you can return to the urgent care for an injection.  It is recommended that you refrain from sexual activity until your test results are negative or until you have completed an appropriate medication regimen as dictated by your test results.  Please use a condom or another barrier method to help prevent STD transmission.  Please make sure that you communicate with your partners regarding your test results should any positive results, about as they will also need to be tested and screened.  Since her symptoms appear to be similar to when you were diagnosed with bacterial vaginosis in the past I have sent in a medication called metronidazole  vaginal gel for you to use at bedtime for 5  days. If changes to your medication regimen are indicated by your test results we will keep you updated and medications will be called in.     ED Prescriptions     Medication Sig Dispense Auth. Provider   metroNIDAZOLE  (METROGEL ) 0.75 % vaginal gel Place 1 Applicatorful vaginally at bedtime for 5 days. 70 g Emma-Lee Oddo E, PA-C      PDMP not reviewed this encounter.   Darlene Mooring, PA-C 03/31/24 2020

## 2024-03-31 NOTE — Discharge Instructions (Signed)
 You were seen today for STD screening and vaginal discharge changes.  We collected a cervicovaginal swab that we will assess for gonorrhea, chlamydia, trichomonas, bacterial vaginosis, yeast.  If collected blood work that we will assess for HIV and syphilis.  We will keep you updated with these results once they are available.  If any medications are indicated by those test results we will call you and medications will either be sent to the pharmacy on file or you can return to the urgent care for an injection.  It is recommended that you refrain from sexual activity until your test results are negative or until you have completed an appropriate medication regimen as dictated by your test results.  Please use a condom or another barrier method to help prevent STD transmission.  Please make sure that you communicate with your partners regarding your test results should any positive results, about as they will also need to be tested and screened.  Since her symptoms appear to be similar to when you were diagnosed with bacterial vaginosis in the past I have sent in a medication called metronidazole  vaginal gel for you to use at bedtime for 5 days. If changes to your medication regimen are indicated by your test results we will keep you updated and medications will be called in.

## 2024-03-31 NOTE — ED Triage Notes (Signed)
 Pt presents with complaints of thick, white vaginal discharge x 1 week. Pt does state the discharge has an unusual odor to it. Pt currently denies pain. Pt is concerned as this issue tends to be recurrent. Prescribed vaginal gel applied about a month ago, none applied recently.

## 2024-04-01 LAB — CERVICOVAGINAL ANCILLARY ONLY
Bacterial Vaginitis (gardnerella): POSITIVE — AB
Candida Glabrata: NEGATIVE
Candida Vaginitis: NEGATIVE
Chlamydia: NEGATIVE
Comment: NEGATIVE
Comment: NEGATIVE
Comment: NEGATIVE
Comment: NEGATIVE
Comment: NEGATIVE
Comment: NORMAL
Neisseria Gonorrhea: NEGATIVE
Trichomonas: NEGATIVE

## 2024-04-02 LAB — HIV ANTIBODY (ROUTINE TESTING W REFLEX): HIV Screen 4th Generation wRfx: NONREACTIVE

## 2024-04-02 LAB — RPR: RPR Ser Ql: REACTIVE — AB

## 2024-04-02 LAB — RPR, QUANT+TP ABS (REFLEX)
Rapid Plasma Reagin, Quant: 1:2 {titer} — ABNORMAL HIGH
T Pallidum Abs: REACTIVE — AB

## 2024-05-01 ENCOUNTER — Telehealth

## 2024-05-01 DIAGNOSIS — B9689 Other specified bacterial agents as the cause of diseases classified elsewhere: Secondary | ICD-10-CM | POA: Diagnosis not present

## 2024-05-01 DIAGNOSIS — N760 Acute vaginitis: Secondary | ICD-10-CM | POA: Diagnosis not present

## 2024-05-01 MED ORDER — METRONIDAZOLE 0.75 % VA GEL
1.0000 | Freq: Every day | VAGINAL | 0 refills | Status: DC
Start: 2024-05-01 — End: 2024-10-13

## 2024-05-01 MED ORDER — METRONIDAZOLE 500 MG PO TABS: 500.0000 mg | ORAL_TABLET | Freq: Two times a day (BID) | ORAL | 0 refills | Status: DC

## 2024-05-01 NOTE — Progress Notes (Signed)
 Virtual Visit Consent   Darlene Ortiz, you are scheduled for a virtual visit with a [facility] provider today. Just as with appointments in the office, your consent must be obtained to participate. Your consent will be active for this visit and any virtual visit you may have with one of our providers in the next 365 days. If you have a MyChart account, a copy of this consent can be sent to you electronically.  As this is a virtual visit, video technology does not allow for your provider to perform a traditional examination. This may limit your provider's ability to fully assess your condition. If your provider identifies any concerns that need to be evaluated in person or the need to arrange testing (such as labs, EKG, etc.), we will make arrangements to do so. Although advances in technology are sophisticated, we cannot ensure that it will always work on either your end or our end. If the connection with a video visit is poor, the visit may have to be switched to a telephone visit. With either a video or telephone visit, we are not always able to ensure that we have a secure connection.  By engaging in this virtual visit, you consent to the provision of healthcare and authorize for your insurance to be billed (if applicable) for the services provided during this visit. Depending on your insurance coverage, you may receive a charge related to this service.  I need to obtain your verbal consent now. Are you willing to proceed with your visit today? Darlene Ortiz has provided verbal consent on 05/01/2024 for a virtual visit (video or telephone). Darlene Ortiz, [place]  Date: 05/01/2024 7:52 AM   Virtual Visit via Video Note   I, Darlene Ortiz, connected with  Darlene Ortiz  ([id number], 1996/03/25) on 05/01/24 at  7:45 AM EDT by a video-enabled telemedicine application and verified that I am speaking with the correct person using two identifiers.  Location: Patient: Virtual Visit Location  Patient: Home Provider: Virtual Visit Location Provider: Home Office   I discussed the limitations of evaluation and management by telemedicine and the availability of in person appointments. The patient expressed understanding and agreed to proceed.    History of Present Illness: Darlene Ortiz is a 28 y.o. who identifies as a female who was assigned female at birth, and is being seen today for possible BV. Patient endorses vaginal discharge with foul-smelling (fishy) odor starting over the past 5 days. Denies concern for STI. Denies concern for pregnancy. LMP -- within the past 2 days ended.   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   Syphilis affecting pregnancy 11/06/2022   Orbit fracture, right, closed, initial encounter (HCC) 12/22/2021    Allergies: No Known Allergies Medications:  Current Outpatient Medications:    metroNIDAZOLE  (METROGEL ) 0.75 % vaginal gel, Place 1 Applicatorful vaginally at bedtime., Disp: 70 g, Rfl: 0   cetirizine  (ZYRTEC  ALLERGY) 10 MG tablet, Take 1 tablet (10 mg total) by mouth daily., Disp: 30 tablet, Rfl: 0   Drospirenone  (SLYND ) 4 MG TABS, Take 1 tablet (4 mg total) by mouth daily., Disp: 84 tablet, Rfl: 4  Observations/Objective: Patient is well-developed, well-nourished in no acute distress.  Resting comfortably at home.  Head is normocephalic, atraumatic.  No labored breathing. Speech is clear and coherent with logical content.  Patient  is alert and oriented at baseline.   Assessment and Plan: 1. Bacterial vaginosis (Primary) - metroNIDAZOLE  (METROGEL ) 0.75 % vaginal gel; Place 1 Applicatorful vaginally at bedtime.  Dispense: 70 g; Refill: 0  Classic symptoms. No concern for pregnancy or STI. Prefers Metrogel . Rx sent to pharmacy. Giving frequent occurrence of BV,  recommended she discuss potential for boric acid supp with her GYN.   Follow Up Instructions: I discussed the assessment and treatment plan with the patient. The patient was provided an opportunity to ask questions and all were answered. The patient agreed with the plan and demonstrated an understanding of the instructions.  A copy of instructions were sent to the patient via MyChart unless otherwise noted below.   The patient was advised to call back or seek an in-person evaluation if the symptoms worsen or if the condition fails to improve as anticipated.    Darlene Maillard, PA-C

## 2024-05-01 NOTE — Patient Instructions (Signed)
 Shauna Del, thank you for joining Hyla Maillard, PA-C for today's virtual visit.  While this provider is not your primary care provider (PCP), if your PCP is located in our provider database this encounter information will be shared with them immediately following your visit.   A [place] MyChart account gives you access to today's visit and all your visits, tests, and labs performed at [facility] " click here if you don't have a [place] MyChart account or go to [email or web address]  Consent: (Patient) Darlene Ortiz provided verbal consent for this virtual visit at the beginning of the encounter.  Current Medications:  Current Outpatient Medications:    metroNIDAZOLE  (METROGEL ) 0.75 % vaginal gel, Place 1 Applicatorful vaginally at bedtime., Disp: 70 g, Rfl: 0   cetirizine  (ZYRTEC  ALLERGY) 10 MG tablet, Take 1 tablet (10 mg total) by mouth daily., Disp: 30 tablet, Rfl: 0   Drospirenone  (SLYND ) 4 MG TABS, Take 1 tablet (4 mg total) by mouth daily., Disp: 84 tablet, Rfl: 4   Medications ordered in this encounter:  Meds ordered this encounter  Medications   DISCONTD: metroNIDAZOLE  (FLAGYL ) 500 MG tablet    Sig: Take 1 tablet (500 mg total) by mouth 2 (two) times daily.    Dispense:  14 tablet    Refill:  0    Supervising Provider:   LAMPTEY, PHILIP O [[id number]]   metroNIDAZOLE  (METROGEL ) 0.75 % vaginal gel    Sig: Place 1 Applicatorful vaginally at bedtime.    Dispense:  70 g    Refill:  0    Please discontinue prior RX for Metronidazole  tablets.    Supervising Provider:   Corine Dice [[id number]]     *If you need refills on other medications prior to your next appointment, please contact your pharmacy*  Follow-Up: Call back or seek an in-person evaluation if the symptoms worsen or if the condition fails to improve as anticipated.  [facility] [phone]  Other Instructions Vaginal Infection (Bacterial Vaginosis): What to  Know  Bacterial vaginosis is an infection of the vagina. It happens when the balance of normal germs (bacteria) in the vagina changes. If you don't get treated, it can make it easier for you to get other infections from sex. These are called sexually transmitted infections (STIs). If you're pregnant, you need to get treated right away. This infection can cause a baby to be born early or at a low birth weight. What are the causes? This infection happens when too many harmful germs grow in the vagina. You can't get this infection from toilet seats, bedsheets, swimming pools, or things that touch your vagina. What increases the risk? Having sex with a new person or more than one person. Having sex without protection. Douching. Having an intrauterine device (IUD). Smoking. Using drugs or drinking alcohol. These can lead you to do risky things. Taking certain antibiotics. Being pregnant. What are the signs or symptoms? Some females have no symptoms. Symptoms may include: A gray or white discharge from your vagina. It can be watery or foamy. A fishy smell. This can happen after sex or during your menstrual period. Itching in and around your vagina. Burning or pain when you pee. How is this treated? This infection is treated with antibiotics. These may be given to you as: A pill. A cream for your vagina. A medicine that you put into your vagina (suppository). If the infection comes back, you may need more antibiotics. Follow these instructions at home: Medicines  Take your medicines as told. Take or use your antibiotics as told. Do not stop using them even if you start to feel better. General instructions If the person you have sex with is a female, tell her that you have this infection. She will need to follow up with her doctor. Female partners don't need to be treated. Do not have sex until you finish treatment. Drink more fluids as told. Keep your vagina and butt clean. Wash these areas  with warm water  each day. Wipe from front to back after you poop. If you're breastfeeding a baby, talk to your doctor if you should keep doing so during treatment. How is this prevented? Self-care Do not douche. Do not use deodorant sprays on your vagina. Wear cotton underwear. Do not wear tight pants and pantyhose, especially in the summer. Safe sex Use condoms the correct way and every time you have sex. Use dental dams to protect yourself during oral sex. Limit how many people you have sex with. Get tested for STIs. The person you have sex with should also get tested. Drugs and alcohol Do not smoke, vape, or use nicotine or tobacco. Do not use drugs. Limit the amount of alcohol you drink because it can lead you to do risky things. Where to find more information To learn more: Go to TonerPromos.no. Click Health Topics A-Z. Type "bacterial vaginosis" in the search bar. American Sexual Health Association (ASHA): ashasexualhealth.org U.S. Department of Health and [employer], Office on Women's Health: TravelLesson.ca Contact a doctor if: Your symptoms don't get better, even after treatment. You have more discharge or pain when you pee. You have a fever or chills. You have pain in your belly or in the area between your hips. You have pain during sex. You bleed from your vagina between menstrual periods. This information is not intended to replace advice given to you by your health care provider. Make sure you discuss any questions you have with your health care provider. Document Revised: 05/09/2023 Document Reviewed: 05/09/2023 Elsevier Patient Education  2024 Elsevier Inc.  Boric Acid Vaginal Suppositories What is this medication? BORIC ACID (BOHR ik AS id) may support vaginal health. It may relieve the symptoms of a yeast infection, such as itching, burning, and odor. This medicine may be used for other purposes; ask your health care provider or pharmacist if you have  questions. COMMON BRAND NAME(S): AZO Boric Acid with Aloe Vera, Hylafem What should I tell my care team before I take this medication? They need to know if you have any of these conditions: Diabetes Frequent infections HIV or AIDS Immune system problems An unusual or allergic reaction to boric acid, other medications, foods, dyes, or preservatives Pregnant or trying to get pregnant Breast-feeding How should I use this medication? This medication is for use in the vagina. Do not take by mouth. Follow the directions on the prescription label. Read package directions carefully before using. Wash hands before and after use. Use this medication at bedtime, unless otherwise directed by your care team. Do not use your medication more often than directed. Do not stop using this medication except on your care team's advice. Talk to your care team about the use of this medication in children. This medication is not approved for use in children. Overdosage: If you think you have taken too much of this medicine contact a poison control center or emergency room at once. NOTE: This medicine is only for you. Do not share this medicine with others. What  if I miss a dose? If you miss a dose, use it as soon as you can. If it is almost time for your next dose, use only that dose. Do not use double or extra doses. What may interact with this medication? Interactions are not expected. Do not use any other vaginal products without telling your care team. This list may not describe all possible interactions. Give your health care provider a list of all the medicines, herbs, non-prescription drugs, or dietary supplements you use. Also tell them if you smoke, drink alcohol, or use illegal drugs. Some items may interact with your medicine. What should I watch for while using this medication? Tell your care team if your symptoms do not start to get better within a few days. It is better not to have sex until you have  finished your treatment. This medication may cause condoms, diaphragms, and spermicides to not work as well. Do not rely on any of these methods to prevent sexually transmitted infections (STIs) or pregnancy while you are using this medication. Vaginal medications may come out of the vagina during treatment. To keep the medication from getting on your clothing, wear a panty liner. The use of tampons is not recommended. To help clear up the infection, wear freshly washed cotton, not synthetic, underwear. What side effects may I notice from receiving this medication? Side effects that you should report to your care team as soon as possible: Allergic reactions--skin rash, itching, hives, swelling of the face, lips, tongue, or throat Unusual vaginal discharge, itching, or odor Side effects that usually do not require medical attention (report to your care team if they continue or are bothersome): Vaginal irritation at the application site This list may not describe all possible side effects. Call your doctor for medical advice about side effects. You may report side effects to FDA at 1-800-FDA-1088. Where should I keep my medication? Keep out of the reach of children and pets. Store in a cool, dry place between 15 and 30 degrees C (59 and 86 degrees F). Keep away from sunlight. Throw away any unused medication after the expiration date. NOTE: This sheet is a summary. It may not cover all possible information. If you have questions about this medicine, talk to your doctor, pharmacist, or health care provider.  2024 Elsevier/Gold Standard (2021-11-07 00:00:00)  If you have been instructed to have an in-person evaluation today at a local Urgent Care facility, please use the link below. It will take you to a list of all of our available [facility], including address, phone number and hours of operation. Please do not delay care.  [facility]  If you or a family member do not  have a primary care provider, use the link below to schedule a visit and establish care. When you choose a [facility] primary care physician or advanced practice provider, you gain a long-term partner in health. Find a Primary Care Provider  Learn more about [facility] in-office and virtual care options: [facility] - Get Care Now

## 2024-05-27 ENCOUNTER — Ambulatory Visit (HOSPITAL_COMMUNITY)
Admission: EM | Admit: 2024-05-27 | Discharge: 2024-05-27 | Disposition: A | Discharge status: Home / Self Care | Attending: Internal Medicine | Admitting: Internal Medicine

## 2024-05-27 ENCOUNTER — Encounter (HOSPITAL_COMMUNITY): Payer: Self-pay | Admitting: *Deleted

## 2024-05-27 DIAGNOSIS — N898 Other specified noninflammatory disorders of vagina: Secondary | ICD-10-CM | POA: Diagnosis not present

## 2024-05-27 DIAGNOSIS — Z3201 Encounter for pregnancy test, result positive: Secondary | ICD-10-CM | POA: Diagnosis present

## 2024-05-27 LAB — HIV ANTIBODY (ROUTINE TESTING W REFLEX): HIV Screen 4th Generation wRfx: NONREACTIVE

## 2024-05-27 LAB — POCT URINE PREGNANCY: Preg Test, Ur: POSITIVE — AB

## 2024-05-27 NOTE — ED Triage Notes (Addendum)
 Pt complains of vaginal discharge X 4 days She would like STI testing cyto and blood work.  She states her LMP was 5/21 and she would like a pregnancy test also.    Pt would like metrogel  if she has BV

## 2024-05-27 NOTE — ED Provider Notes (Signed)
 [facility]    CSN: [id number] Arrival date & time: 05/27/24  1748      History   Chief Complaint Chief Complaint  Patient presents with   Vaginal Discharge   Possible Pregnancy    HPI Darlene Ortiz is a 28 y.o. female.   28 year old female who presents urgent care with 3 days of yellow/thickish discharge vaginally.  She reports it is smelly at times.  She denies any vaginal pain, abdominal pain, nausea, vomiting, vaginal bleeding, dysuria, hematuria.  She does relate that her last period was May 21.  She does think she may be pregnant. No cramping or abdominal pain. She has a Estate agent.    Vaginal Discharge Associated symptoms: no abdominal pain, no dysuria, no fever and no vomiting   Possible Pregnancy Pertinent negatives include no chest pain, no abdominal pain and no shortness of breath.    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 Authorizing Provider  cetirizine  (ZYRTEC  ALLERGY) 10 MG tablet Take 1 tablet (10 mg total) by mouth daily. 09/24/23   Christopher Savannah, PA-C  Drospirenone  (SLYND ) 4 MG TABS Take 1 tablet (4 mg total) by mouth daily. 04/09/23   Lola Donnice HERO, MD  metroNIDAZOLE  (METROGEL ) 0.75 % vaginal gel Place 1 Applicatorful vaginally at bedtime. 05/01/24   Gladis Elsie BROCKS, PA-C    Family History Family History  Problem Relation Age of Onset   Diabetes Mother    Breast cancer Maternal Grandmother     Social History Social History   Tobacco Use   Smoking status: Never   Smokeless tobacco: Never  Vaping Use   Vaping status: Never Used  Substance Use Topics   Alcohol use: Yes    Comment: occassionally   Drug use: Not Currently    Types: Marijuana     Allergies   Patient has no known allergies.   Review of Systems Review of Systems  Constitutional:  Negative for chills and fever.  HENT:  Negative for ear pain and sore throat.   Eyes:  Negative for pain and visual disturbance.  Respiratory:  Negative for cough and shortness of breath.   Cardiovascular:  Negative for chest pain and palpitations.  Gastrointestinal:  Negative for abdominal pain and vomiting.  Genitourinary:  Positive for vaginal discharge. Negative for dysuria, frequency, hematuria, urgency, vaginal bleeding and vaginal pain.  Musculoskeletal:  Negative for arthralgias and back pain.  Skin:  Negative for color change and rash.  Neurological:  Negative for seizures and syncope.  All other systems reviewed  and are negative.    Physical Exam Triage Vital Signs ED Triage Vitals  Encounter Vitals Group     BP 05/27/24 1818 125/76     Girls Systolic BP Percentile --      Girls Diastolic BP Percentile --      Boys Systolic BP Percentile --      Boys Diastolic BP Percentile --      Pulse Rate 05/27/24 1818 92     Resp 05/27/24 1818 18     Temp 05/27/24 1818 98.4 F (36.9 C)     Temp Source 05/27/24 1818 Oral     SpO2 05/27/24 1818 99 %     Weight --      Height --      Head Circumference --      Peak Flow --      Pain Score 05/27/24 1816 0     Pain Loc --      Pain Education --      Exclude from Growth Chart --    No data found.  Updated Vital Signs BP 125/76 (BP Location: Left Arm)   Pulse 92   Temp 98.4 F (36.9 C) (Oral)   Resp 18   LMP  04/23/2024 (Exact Date)   SpO2 99%   Visual Acuity Right Eye Distance:   Left Eye Distance:   Bilateral Distance:    Right Eye Near:   Left Eye Near:    Bilateral Near:     Physical Exam Vitals and nursing note reviewed.  Constitutional:      General: She is not in acute distress.    Appearance: She is well-developed.  HENT:     Head: Normocephalic and atraumatic.   Eyes:     Conjunctiva/sclera: Conjunctivae normal.    Cardiovascular:     Rate and Rhythm: Normal rate and regular rhythm.     Heart sounds: No murmur heard. Pulmonary:     Effort: Pulmonary effort is normal. No respiratory distress.     Breath sounds: Normal breath sounds.  Abdominal:     General: There is no distension.     Palpations: Abdomen is soft.     Tenderness: There is no abdominal tenderness. There is no guarding.   Musculoskeletal:        General: No swelling.     Cervical back: Neck supple.   Skin:    General: Skin is warm and dry.     Capillary Refill: Capillary refill takes less than 2 seconds.   Neurological:     General: No focal deficit present.     Mental Status: She is alert.   Psychiatric:        Mood and Affect: Mood normal.      UC Treatments / Results  Labs (all labs ordered are listed, but only abnormal results are displayed) Labs Reviewed  POCT URINE PREGNANCY - Abnormal; Notable for the following components:      Result Value   Preg Test, Ur Positive (*)    All other components within normal limits  RPR  HIV ANTIBODY (ROUTINE TESTING W REFLEX)  CERVICOVAGINAL ANCILLARY ONLY    EKG   Radiology No results found.  Procedures Procedures (including critical care time)  Medications Ordered in UC Medications - No data to display  Initial Impression / Assessment and Plan / UC Course  I have reviewed the triage vital signs and the nursing notes.  Pertinent labs & imaging results that were available during my care of the  patient were reviewed by me and  considered in my medical decision making (see chart for details).     Positive pregnancy test  Vaginal discharge   Urine pregnancy test was positive today.  Recommend following up with your OB/GYN as soon as possible.  Screening swab and blood work done today and results will be available in 24-48 hours. We will contact you if we need to arrange additional treatment based on your testing. Negative results will be on your MyChart account.   If you develop abdominal pain, cramping, bloody vaginal discharge, then recommend going to the women's emergency room at [facility].  Final Clinical Impressions(s) / UC Diagnoses   Final diagnoses:  Positive pregnancy test  Vaginal discharge     Discharge Instructions      Urine pregnancy test was positive today.  Recommend following up with your OB/GYN as soon as possible.  Screening swab and blood work done today and results will be available in 24-48 hours. We will contact you if we need to arrange additional treatment based on your testing. Negative results will be on your MyChart account.   If you develop abdominal pain, cramping, bloody vaginal discharge, then recommend going to the women's emergency room at [facility].     ED Prescriptions   None    PDMP not reviewed this encounter.   Teresa Almarie LABOR, [place] 05/27/24 1901

## 2024-05-27 NOTE — Discharge Instructions (Addendum)
 Urine pregnancy test was positive today.  Recommend following up with your OB/GYN as soon as possible.  Screening swab and blood work done today and results will be available in 24-48 hours. We will contact you if we need to arrange additional treatment based on your testing. Negative results will be on your MyChart account.   If you develop abdominal pain, cramping, bloody vaginal discharge, then recommend going to the women's emergency room at [facility].

## 2024-05-28 ENCOUNTER — Ambulatory Visit (HOSPITAL_COMMUNITY): Payer: Self-pay

## 2024-05-28 LAB — CERVICOVAGINAL ANCILLARY ONLY
Bacterial Vaginitis (gardnerella): POSITIVE — AB
Candida Glabrata: NEGATIVE
Candida Vaginitis: NEGATIVE
Chlamydia: NEGATIVE
Comment: NEGATIVE
Comment: NEGATIVE
Comment: NEGATIVE
Comment: NEGATIVE
Comment: NEGATIVE
Comment: NORMAL
Neisseria Gonorrhea: NEGATIVE
Trichomonas: NEGATIVE

## 2024-05-28 LAB — RPR
RPR Ser Ql: REACTIVE — AB
RPR Titer: 1:1 {titer}

## 2024-05-28 MED ORDER — METRONIDAZOLE 0.75 % VA GEL: 1.0000 | Freq: Every day | VAGINAL | 0 refills | Status: AC

## 2024-05-29 LAB — T.PALLIDUM AB, TOTAL: T Pallidum Abs: REACTIVE — AB

## 2024-06-09 ENCOUNTER — Inpatient Hospital Stay (HOSPITAL_COMMUNITY)
Admission: AD | Admit: 2024-06-09 | Discharge: 2024-06-09 | Discharge status: Against Medical Advice | Attending: Obstetrics and Gynecology | Admitting: Obstetrics and Gynecology

## 2024-06-09 ENCOUNTER — Encounter (HOSPITAL_COMMUNITY): Payer: Self-pay | Admitting: *Deleted

## 2024-06-09 DIAGNOSIS — O23591 Infection of other part of genital tract in pregnancy, first trimester: Secondary | ICD-10-CM | POA: Diagnosis not present

## 2024-06-09 DIAGNOSIS — B9689 Other specified bacterial agents as the cause of diseases classified elsewhere: Secondary | ICD-10-CM | POA: Insufficient documentation

## 2024-06-09 DIAGNOSIS — O209 Hemorrhage in early pregnancy, unspecified: Secondary | ICD-10-CM | POA: Insufficient documentation

## 2024-06-09 DIAGNOSIS — Z3A01 Less than 8 weeks gestation of pregnancy: Secondary | ICD-10-CM | POA: Diagnosis not present

## 2024-06-09 DIAGNOSIS — N930 Postcoital and contact bleeding: Secondary | ICD-10-CM | POA: Diagnosis present

## 2024-06-09 DIAGNOSIS — N760 Acute vaginitis: Secondary | ICD-10-CM | POA: Diagnosis present

## 2024-06-09 NOTE — MAU Note (Signed)
 Darlene Ortiz is a 28 y.o. at [redacted]w[redacted]d here in MAU reporting: vag bleeding that stared today.  Reports she did have intercourse yesterday. C/o occasional sharp pain to her lower abd.  Seen in urgent care on 6/26 Being treated for BV using metrogel   LMP:  Onset of complaint: today Pain score: 6 Vitals:   06/09/24 2155  BP: 121/76  Pulse: 93  Resp: 18  Temp: 97.9 F (36.6 C)     FHT: n/a  Lab orders placed from triage:

## 2024-06-09 NOTE — MAU Provider Note (Signed)
 Patient left prior to being seen

## 2024-07-02 ENCOUNTER — Telehealth: Admitting: *Deleted

## 2024-07-02 DIAGNOSIS — Z8679 Personal history of other diseases of the circulatory system: Secondary | ICD-10-CM

## 2024-07-02 DIAGNOSIS — Z8759 Personal history of other complications of pregnancy, childbirth and the puerperium: Secondary | ICD-10-CM | POA: Diagnosis not present

## 2024-07-02 DIAGNOSIS — O3680X0 Pregnancy with inconclusive fetal viability, not applicable or unspecified: Secondary | ICD-10-CM

## 2024-07-02 DIAGNOSIS — Z3A10 10 weeks gestation of pregnancy: Secondary | ICD-10-CM

## 2024-07-02 DIAGNOSIS — O09899 Supervision of other high risk pregnancies, unspecified trimester: Secondary | ICD-10-CM | POA: Insufficient documentation

## 2024-07-02 DIAGNOSIS — O09891 Supervision of other high risk pregnancies, first trimester: Secondary | ICD-10-CM

## 2024-07-02 DIAGNOSIS — O0992 Supervision of high risk pregnancy, unspecified, second trimester: Secondary | ICD-10-CM | POA: Insufficient documentation

## 2024-07-02 DIAGNOSIS — Z3490 Encounter for supervision of normal pregnancy, unspecified, unspecified trimester: Secondary | ICD-10-CM | POA: Insufficient documentation

## 2024-07-02 MED ORDER — BLOOD PRESSURE KIT DEVI: 1.0000 | 0 refills | Status: DC | PRN

## 2024-07-02 NOTE — Progress Notes (Signed)
 New OB Intake  I connected with Darlene Ortiz  on 07/02/24 at  2:15 PM EDT by MyChart Video Visit and verified that I am speaking with the correct person using two identifiers. Nurse is located at [facility] and pt is located  in parked car.  I discussed the limitations, risks, security and privacy concerns of performing an evaluation and management service by telephone and the availability of in person appointments. I also discussed with the patient that there may be a patient responsible charge related to this service. The patient expressed understanding and agreed to proceed.  I explained I am completing New OB Intake today. We discussed EDD of 01/28/25 based on  sure LMP of  04/23/24.Viability US  offered and scheduled for 07/08/24 Pt is [id number]. I reviewed her allergies, medications and Medical/Surgical/OB history.    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 Discussed the nature of our practice with multiple providers including residents and students. Due to the size of the practice, the delivering provider may not be the same as those providing prenatal care.   Patient is not interested in water  birth.  MyChart/Babyscripts MyChart access verified. I explained pt will have some visits in office and some virtually. Babyscripts instructions given and order placed.  Blood Pressure Cuff/Weight Scale Blood pressure cuff ordered for patient to pick-up from [employer]. Explained after first prenatal appt pt will check weekly and document in Babyscripts. Patient does have weight scale.  Anatomy US  Explained after viability US  will have anatomy  US  around 19 weeks. Anatomy US  scheduled for 09/05/24 at 2:00.  Is patient a CenteringPregnancy candidate?  Declined Declined due to Enrolled in [facility]   Is patient a Mom+Baby Combined Care candidate?  Accepted - was [facility] with last pregnancy , wants to do [facility].  If accepted, confirm patient does not intend to move from the area for at least 12 months, then notify Mom+Baby staff  Is patient a candidate for Babyscripts Optimization? Yes, patient accepted    First visit review I reviewed new OB appt with patient. Explained pt will be seen by Dr. Cresenzo at first visit. Discussed [place] genetic screening with patient. She would like  Panorama drawn with routine prenatal labs at new ob visit.    Last Pap Diagnosis  Date Value Ref Range Status  10/30/2022 (A)  Final   - Atypical squamous cells of undetermined significance (ASC-US )    Rock Skip PEAK 07/02/2024  2:52 PM

## 2024-07-08 ENCOUNTER — Other Ambulatory Visit: Payer: Self-pay

## 2024-07-08 ENCOUNTER — Ambulatory Visit

## 2024-07-08 DIAGNOSIS — Z3491 Encounter for supervision of normal pregnancy, unspecified, first trimester: Secondary | ICD-10-CM | POA: Diagnosis not present

## 2024-07-08 DIAGNOSIS — Z3A11 11 weeks gestation of pregnancy: Secondary | ICD-10-CM | POA: Diagnosis not present

## 2024-07-08 DIAGNOSIS — O3680X0 Pregnancy with inconclusive fetal viability, not applicable or unspecified: Secondary | ICD-10-CM

## 2024-07-09 ENCOUNTER — Other Ambulatory Visit (HOSPITAL_COMMUNITY)
Admission: RE | Admit: 2024-07-09 | Discharge: 2024-07-09 | Disposition: A | Discharge status: Home / Self Care | Source: Ambulatory Visit | Attending: Family Medicine | Admitting: Family Medicine

## 2024-07-09 ENCOUNTER — Ambulatory Visit (INDEPENDENT_AMBULATORY_CARE_PROVIDER_SITE_OTHER): Admitting: Family Medicine

## 2024-07-09 VITALS — BP 111/61 | HR 80 | Wt 179.6 lb

## 2024-07-09 DIAGNOSIS — Z3A12 12 weeks gestation of pregnancy: Secondary | ICD-10-CM | POA: Diagnosis present

## 2024-07-09 DIAGNOSIS — Z1332 Encounter for screening for maternal depression: Secondary | ICD-10-CM | POA: Diagnosis not present

## 2024-07-09 DIAGNOSIS — O30002 Twin pregnancy, unspecified number of placenta and unspecified number of amniotic sacs, second trimester: Secondary | ICD-10-CM

## 2024-07-09 DIAGNOSIS — Z3480 Encounter for supervision of other normal pregnancy, unspecified trimester: Secondary | ICD-10-CM | POA: Diagnosis present

## 2024-07-09 DIAGNOSIS — O09891 Supervision of other high risk pregnancies, first trimester: Secondary | ICD-10-CM

## 2024-07-09 DIAGNOSIS — Z8679 Personal history of other diseases of the circulatory system: Secondary | ICD-10-CM

## 2024-07-09 DIAGNOSIS — N898 Other specified noninflammatory disorders of vagina: Secondary | ICD-10-CM | POA: Insufficient documentation

## 2024-07-09 DIAGNOSIS — O30001 Twin pregnancy, unspecified number of placenta and unspecified number of amniotic sacs, first trimester: Secondary | ICD-10-CM

## 2024-07-09 DIAGNOSIS — O09899 Supervision of other high risk pregnancies, unspecified trimester: Secondary | ICD-10-CM

## 2024-07-09 DIAGNOSIS — Z8759 Personal history of other complications of pregnancy, childbirth and the puerperium: Secondary | ICD-10-CM

## 2024-07-09 MED ORDER — ASPIRIN 81 MG PO TBEC: 162.0000 mg | DELAYED_RELEASE_TABLET | Freq: Every day | ORAL | 12 refills | Status: DC

## 2024-07-09 MED ORDER — PRENATAL VITAMINS 28-0.8 MG PO TABS: 1.0000 | ORAL_TABLET | Freq: Every day | ORAL | 3 refills | Status: DC

## 2024-07-09 NOTE — Progress Notes (Signed)
 Subjective:   Darlene Ortiz is a 28 y.o. G3P1011 at [redacted]w[redacted]d by LMP being seen today for her first obstetrical visit.  Her obstetrical history is significant for pregnancy induced hypertension. Patient does intend to breast feed. Pregnancy history fully reviewed.  Patient reports no contractions, no cramping, and no leaking.  HISTORY: OB History  Gravida Para Term Preterm AB Living  3 1 1  0 1 1  SAB IAB Ectopic Multiple Live Births  0 1 0 0 1    # Outcome Date GA Lbr Len/2nd Weight Sex Type Anes PTL Lv  3 Current           2 Term 04/07/23 [redacted]w[redacted]d 03:29 / 01:10 8 lb 1.5 oz (3.67 kg) F Vag-Spont EPI  LIV     Birth Comments: WDL     Name: GLADIES, SOFRANKO     Apgar1: 8  Apgar5: 9  1 IAB 04/20/20 [redacted]w[redacted]d          Last pap smear was 10/30/2022 and was abnormal - ASCUS with high risk HPV Past Medical History:  Diagnosis Date   Syphilis 11/2022   Past Surgical History:  Procedure Laterality Date   TOOTH EXTRACTION     Family History  Problem Relation Age of Onset   Diabetes Mother    Breast cancer Maternal Grandmother    Social History   Tobacco Use   Smoking status: Never   Smokeless tobacco: Never  Vaping Use   Vaping status: Never Used  Substance Use Topics   Alcohol use: Not Currently    Comment: occassionally   Drug use: Not Currently    Types: Marijuana   No Known Allergies Current Outpatient Medications on File Prior to Visit  Medication Sig Dispense Refill   acetaminophen  (TYLENOL ) 325 MG tablet Take 650 mg by mouth every 6 (six) hours as needed.     Blood Pressure Monitoring (BLOOD PRESSURE KIT) DEVI 1 Device by Does not apply route as needed. (Patient not taking: Reported on 07/09/2024) 1 each 0   cetirizine  (ZYRTEC  ALLERGY) 10 MG tablet Take 1 tablet (10 mg total) by mouth daily. (Patient not taking: Reported on 07/09/2024) 30 tablet 0   Drospirenone  (SLYND ) 4 MG TABS Take 1 tablet (4 mg total) by mouth daily. (Patient not taking: Reported on 07/09/2024) 84 tablet 4    metroNIDAZOLE  (METROGEL ) 0.75 % vaginal gel Place 1 Applicatorful vaginally at bedtime. (Patient not taking: Reported on 07/09/2024) 70 g 0   No current facility-administered medications on file prior to visit.     Exam   Vitals:   07/09/24 1509  BP: 111/61  Pulse: 80  Weight: 179 lb 9.6 oz (81.5 kg)   Fetal Heart Rate (bpm): 152/150  Uterus:     Pelvic Exam: Perineum: no hemorrhoids, normal perineum   Vulva: normal external genitalia, no lesions   Vagina:  normal mucosa, normal discharge   Cervix: no lesions and normal, pap smear done.    Adnexa: normal adnexa and no mass, fullness, tenderness   Bony Pelvis: average  System: General: well-developed, well-nourished female in no acute distress   Skin: normal coloration and turgor, no rashes   Neurologic: oriented, normal, negative, normal mood   Extremities: normal strength, tone, and muscle mass, ROM of all joints is normal   HEENT PERRLA, extraocular movement intact and sclera clear, anicteric   Mouth/Teeth mucous membranes moist, pharynx normal without lesions and dental hygiene good   Neck supple and no masses   Cardiovascular: regular  rate and rhythm   Respiratory:  no respiratory distress, normal breath sounds   Abdomen: soft, non-tender; bowel sounds normal; no masses,  no organomegaly     Assessment:   Pregnancy: G3P1011 Patient Active Problem List   Diagnosis Date Noted   Supervision of low-risk pregnancy 07/02/2024   History of postpartum hypertension 07/02/2024   History of maternal syphilis, currently pregnant 07/02/2024   Secondary syphilis, relapse (treated) 10/02/2023   ASCUS with positive high risk HPV cervical 12/22/2022     Plan:  1. Supervision of other normal pregnancy, antepartum (Primary) FHR of twin A and twin B within normal limits.  Visualized via ultrasound. - CBC/D/Plt+RPR+Rh+ABO+RubIgG... - HgB A1c - Culture, OB Urine - PANORAMA PRENATAL TEST - Cytology - PAP( [place]) -  Cervicovaginal ancillary only  2. History of postpartum hypertension Recommended ASA 162 mg daily BP within normal limits today  3. History of maternal syphilis, currently pregnant Treated in previous pregnancy RPR today  4. Twin gestation in second trimester, unspecified multiple gestation type FHR appropriate today for both twins - CBC/D/Plt+RPR+Rh+ABO+RubIgG... - HgB A1c - Culture, OB Urine - PANORAMA PRENATAL TEST - Cytology - PAP( [place])  5. [redacted] weeks gestation of pregnancy - CBC/D/Plt+RPR+Rh+ABO+RubIgG... - HgB A1c - Culture, OB Urine - PANORAMA PRENATAL TEST - Cytology - PAP( [place])  6. Vaginal discharge Wet prep collected today, likely yeast - Cervicovaginal ancillary only   Initial labs drawn. Continue prenatal vitamins. Genetic Screening discussed, NIPS: ordered. Ultrasound discussed; fetal anatomic survey: ordered. Problem list reviewed and updated. The nature of  - [facility] with multiple MDs and other Advanced Practice Providers was explained to patient; also emphasized that residents, students are part of our team. Routine obstetric precautions reviewed. No follow-ups on file.

## 2024-07-10 LAB — CBC/D/PLT+RPR+RH+ABO+RUBIGG...
Antibody Screen: NEGATIVE
Basophils Absolute: 0 x10E3/uL (ref 0.0–0.2)
Basos: 0 %
EOS (ABSOLUTE): 0.1 x10E3/uL (ref 0.0–0.4)
Eos: 2 %
HCV Ab: NONREACTIVE
HIV Screen 4th Generation wRfx: NONREACTIVE
Hematocrit: 34.9 % (ref 34.0–46.6)
Hemoglobin: 10.8 g/dL — ABNORMAL LOW (ref 11.1–15.9)
Hepatitis B Surface Ag: NEGATIVE
Immature Grans (Abs): 0 x10E3/uL (ref 0.0–0.1)
Immature Granulocytes: 0 %
Lymphocytes Absolute: 1.6 x10E3/uL (ref 0.7–3.1)
Lymphs: 39 %
MCH: 25.7 pg — ABNORMAL LOW (ref 26.6–33.0)
MCHC: 30.9 g/dL — ABNORMAL LOW (ref 31.5–35.7)
MCV: 83 fL (ref 79–97)
Monocytes Absolute: 0.5 x10E3/uL (ref 0.1–0.9)
Monocytes: 11 %
Neutrophils Absolute: 2 x10E3/uL (ref 1.4–7.0)
Neutrophils: 48 %
Platelets: 357 x10E3/uL (ref 150–450)
RBC: 4.2 x10E6/uL (ref 3.77–5.28)
RDW: 15.5 % — ABNORMAL HIGH (ref 11.7–15.4)
RPR Ser Ql: REACTIVE — AB
Rh Factor: POSITIVE
Rubella Antibodies, IGG: 3.27 {index} (ref >0.99)
WBC: 4.2 x10E3/uL (ref 3.4–10.8)

## 2024-07-10 LAB — PROTEIN / CREATININE RATIO, URINE
Creatinine, Urine: 109.8 mg/dL
Protein, Ur: 8.5 mg/dL
Protein/Creat Ratio: 77 mg/g{creat} (ref 0–200)

## 2024-07-10 LAB — RPR, QUANT+TP ABS (REFLEX)
Rapid Plasma Reagin, Quant: 1:2 {titer} — ABNORMAL HIGH
T Pallidum Abs: REACTIVE — AB

## 2024-07-10 LAB — HEMOGLOBIN A1C
Est. average glucose Bld gHb Est-mCnc: 111 mg/dL
Hgb A1c MFr Bld: 5.5 % (ref 4.8–5.6)

## 2024-07-10 LAB — HCV INTERPRETATION

## 2024-07-11 LAB — COMPREHENSIVE METABOLIC PANEL WITH GFR
ALT: 8 IU/L (ref 0–32)
AST: 11 IU/L (ref 0–40)
Albumin: 4.2 g/dL (ref 4.0–5.0)
Alkaline Phosphatase: 62 IU/L (ref 44–121)
BUN/Creatinine Ratio: 9 (ref 9–23)
BUN: 5 mg/dL — ABNORMAL LOW (ref 6–20)
Bilirubin Total: <0.2 mg/dL (ref 0.0–1.2)
CO2: 19 mmol/L — ABNORMAL LOW (ref 20–29)
Calcium: 9.4 mg/dL (ref 8.7–10.2)
Chloride: 100 mmol/L (ref 96–106)
Creatinine, Ser: 0.54 mg/dL — ABNORMAL LOW (ref 0.57–1.00)
Globulin, Total: 3.2 g/dL (ref 1.5–4.5)
Glucose: 74 mg/dL (ref 70–99)
Potassium: 4 mmol/L (ref 3.5–5.2)
Sodium: 136 mmol/L (ref 134–144)
Total Protein: 7.4 g/dL (ref 6.0–8.5)
eGFR: 128 mL/min/1.73 (ref >59)

## 2024-07-11 LAB — CULTURE, OB URINE

## 2024-07-11 LAB — SPECIMEN STATUS REPORT

## 2024-07-11 LAB — URINE CULTURE, OB REFLEX

## 2024-07-14 LAB — CERVICOVAGINAL ANCILLARY ONLY
Bacterial Vaginitis (gardnerella): NEGATIVE
Candida Glabrata: NEGATIVE
Candida Vaginitis: NEGATIVE
Chlamydia: NEGATIVE
Comment: NEGATIVE
Comment: NEGATIVE
Comment: NEGATIVE
Comment: NEGATIVE
Comment: NEGATIVE
Comment: NORMAL
Neisseria Gonorrhea: NEGATIVE
Trichomonas: NEGATIVE

## 2024-07-17 ENCOUNTER — Ambulatory Visit: Payer: Self-pay | Admitting: Family Medicine

## 2024-07-17 DIAGNOSIS — Z3492 Encounter for supervision of normal pregnancy, unspecified, second trimester: Secondary | ICD-10-CM

## 2024-07-17 LAB — CYTOLOGY - PAP
Chlamydia: NEGATIVE
Comment: NEGATIVE
Comment: NEGATIVE
Comment: NORMAL
Diagnosis: UNDETERMINED — AB
High risk HPV: POSITIVE — AB
Neisseria Gonorrhea: NEGATIVE

## 2024-07-17 NOTE — Progress Notes (Signed)
 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

## 2024-07-19 LAB — PANORAMA PRENATAL TEST FULL PANEL:PANORAMA TEST PLUS 5 ADDITIONAL MICRODELETIONS

## 2024-08-06 ENCOUNTER — Ambulatory Visit: Admitting: Family Medicine

## 2024-08-06 ENCOUNTER — Other Ambulatory Visit (HOSPITAL_COMMUNITY)
Admission: RE | Admit: 2024-08-06 | Discharge: 2024-08-06 | Disposition: A | Discharge status: Home / Self Care | Source: Ambulatory Visit | Attending: Family Medicine | Admitting: Family Medicine

## 2024-08-06 ENCOUNTER — Other Ambulatory Visit: Payer: Self-pay

## 2024-08-06 VITALS — BP 116/76 | HR 80 | Wt 179.0 lb

## 2024-08-06 DIAGNOSIS — Z3480 Encounter for supervision of other normal pregnancy, unspecified trimester: Secondary | ICD-10-CM | POA: Diagnosis present

## 2024-08-06 DIAGNOSIS — R87810 Cervical high risk human papillomavirus (HPV) DNA test positive: Secondary | ICD-10-CM

## 2024-08-06 DIAGNOSIS — O30002 Twin pregnancy, unspecified number of placenta and unspecified number of amniotic sacs, second trimester: Secondary | ICD-10-CM

## 2024-08-06 DIAGNOSIS — O09892 Supervision of other high risk pregnancies, second trimester: Secondary | ICD-10-CM

## 2024-08-06 DIAGNOSIS — A5149 Other secondary syphilitic conditions: Secondary | ICD-10-CM

## 2024-08-06 DIAGNOSIS — Z3482 Encounter for supervision of other normal pregnancy, second trimester: Secondary | ICD-10-CM

## 2024-08-06 DIAGNOSIS — Z8759 Personal history of other complications of pregnancy, childbirth and the puerperium: Secondary | ICD-10-CM

## 2024-08-06 DIAGNOSIS — O09899 Supervision of other high risk pregnancies, unspecified trimester: Secondary | ICD-10-CM

## 2024-08-06 DIAGNOSIS — R87610 Atypical squamous cells of undetermined significance on cytologic smear of cervix (ASC-US): Secondary | ICD-10-CM

## 2024-08-06 DIAGNOSIS — Z8679 Personal history of other diseases of the circulatory system: Secondary | ICD-10-CM

## 2024-08-06 DIAGNOSIS — Z3A16 16 weeks gestation of pregnancy: Secondary | ICD-10-CM

## 2024-08-07 LAB — CERVICOVAGINAL ANCILLARY ONLY
Bacterial Vaginitis (gardnerella): NEGATIVE
Candida Glabrata: NEGATIVE
Candida Vaginitis: POSITIVE — AB
Chlamydia: NEGATIVE
Comment: NEGATIVE
Comment: NEGATIVE
Comment: NEGATIVE
Comment: NEGATIVE
Comment: NEGATIVE
Comment: NORMAL
Neisseria Gonorrhea: NEGATIVE
Trichomonas: NEGATIVE

## 2024-08-08 ENCOUNTER — Ambulatory Visit: Payer: Self-pay | Admitting: Family Medicine

## 2024-08-08 MED ORDER — MICONAZOLE NITRATE 2 % VA CREA: 1.0000 | TOPICAL_CREAM | Freq: Every day | VAGINAL | 0 refills | Status: AC

## 2024-08-08 NOTE — Progress Notes (Signed)
   PRENATAL VISIT NOTE  Subjective:  Darlene Ortiz is a 28 y.o. G3P1011 at [redacted]w[redacted]d being seen today for ongoing prenatal care.  She is currently monitored for the following issues for this high-risk pregnancy and has ASCUS with positive high risk HPV cervical; Secondary syphilis, relapse (treated); Supervision of low-risk pregnancy; History of postpartum hypertension; and History of maternal syphilis, currently pregnant on their problem list.  Patient reports no bleeding, no contractions, no cramping, and no leaking.  Contractions: Not present. Vag. Bleeding: None.  Movement: Absent. Denies leaking of fluid.   The following portions of the patient's history were reviewed and updated as appropriate: allergies, current medications, past family history, past medical history, past social history, past surgical history and problem list.   Objective:    Vitals:   08/06/24 1538  BP: 116/76  Pulse: 80  Weight: 179 lb (81.2 kg)    Fetal Status:      Movement: Absent    General: Alert, oriented and cooperative. Patient is in no acute distress.  Skin: Skin is warm and dry. No rash noted.   Cardiovascular: Normal heart rate noted  Respiratory: Normal respiratory effort, no problems with respiration noted  Abdomen: Soft, gravid, appropriate for gestational age.  Pain/Pressure: Absent     Pelvic: Cervical exam deferred        Extremities: Normal range of motion.  Edema: None  Mental Status: Normal mood and affect. Normal behavior. Normal judgment and thought content.   Assessment and Plan:  Pregnancy: G3P1011 at 103w2d 1. Supervision of other normal pregnancy, antepartum (Primary) FHR BP appropriate today - Cervicovaginal ancillary only( )  2. History of postpartum hypertension On ASA  3. History of maternal syphilis, currently pregnant RPR consistent with previous infection  4. Twin gestation in second trimester, unspecified multiple gestation type FHR within normal limits on  both babies  5. ASCUS with positive high risk HPV cervical She has had multiple Pap smears with this.  Discussed with Dr. Lola LEEP.  Will need postpartum  6. Secondary syphilis, relapse (treated)  7. [redacted] weeks gestation of pregnancy  8. Encounter for supervision of other normal pregnancy in second trimester - PANORAMA PRENATAL TEST  Preterm labor symptoms and general obstetric precautions including but not limited to vaginal bleeding, contractions, leaking of fluid and fetal movement were reviewed in detail with the patient. Please refer to After Visit Summary for other counseling recommendations.   No follow-ups on file.  Future Appointments  Date Time Provider Department Center  09/03/2024  3:15 PM Eldonna Suzen Octave, MD [facility]  09/05/2024  2:00 PM [facility] PROVIDER 1 [facility]  09/05/2024  2:30 PM [facility] US2 [facility]    Norleen LULLA Rover, MD

## 2024-08-27 DIAGNOSIS — O30049 Twin pregnancy, dichorionic/diamniotic, unspecified trimester: Secondary | ICD-10-CM | POA: Insufficient documentation

## 2024-09-03 ENCOUNTER — Ambulatory Visit: Admitting: Family Medicine

## 2024-09-03 ENCOUNTER — Other Ambulatory Visit: Payer: Self-pay

## 2024-09-03 VITALS — BP 124/78 | HR 100 | Wt 185.0 lb

## 2024-09-03 DIAGNOSIS — Z3A20 20 weeks gestation of pregnancy: Secondary | ICD-10-CM

## 2024-09-03 DIAGNOSIS — Z3492 Encounter for supervision of normal pregnancy, unspecified, second trimester: Secondary | ICD-10-CM

## 2024-09-03 DIAGNOSIS — O30042 Twin pregnancy, dichorionic/diamniotic, second trimester: Secondary | ICD-10-CM

## 2024-09-03 DIAGNOSIS — M549 Dorsalgia, unspecified: Secondary | ICD-10-CM

## 2024-09-03 DIAGNOSIS — O99891 Other specified diseases and conditions complicating pregnancy: Secondary | ICD-10-CM

## 2024-09-03 DIAGNOSIS — A5149 Other secondary syphilitic conditions: Secondary | ICD-10-CM

## 2024-09-03 NOTE — Progress Notes (Signed)
   PRENATAL VISIT NOTE  Subjective:  Darlene Ortiz is a 28 y.o. G3P1011 at [redacted]w[redacted]d being seen today for ongoing prenatal care.  She is currently monitored for the following issues for this high-risk pregnancy and has ASCUS with positive high risk HPV cervical; Secondary syphilis, relapse (treated); Supervision of low-risk pregnancy; History of postpartum hypertension; History of maternal syphilis, currently pregnant; and Twin pregnancy, twins dichorionic and diamniotic on their problem list.  Patient reports no complaints.  Contractions: Not present. Vag. Bleeding: None.  Movement: Present. Denies leaking of fluid.   The following portions of the patient's history were reviewed and updated as appropriate: allergies, current medications, past family history, past medical history, past social history, past surgical history and problem list.   Objective:    Vitals:   09/03/24 1528  BP: 124/78  Pulse: 100  Weight: 185 lb (83.9 kg)    Fetal Status:  Fetal Heart Rate (bpm): 152/154 Fundal Height: 25 cm Movement: Present    General: Alert, oriented and cooperative. Patient is in no acute distress.  Skin: Skin is warm and dry. No rash noted.   Cardiovascular: Normal heart rate noted  Respiratory: Normal respiratory effort, no problems with respiration noted  Abdomen: Soft, gravid, appropriate for gestational age.  Pain/Pressure: Present     Pelvic: Cervical exam deferred        Extremities: Normal range of motion.  Edema: None  Mental Status: Normal mood and affect. Normal behavior. Normal judgment and thought content.   Assessment and Plan:  Pregnancy: G3P1011 at [redacted]w[redacted]d 1. Encounter for supervision of low-risk pregnancy in second trimester (Primary) Up to date Feeling movement x2  FH elevated but expected Concerns; back and pelvic pain. Referral to PT made. Discussed use of pregnancy support belt  2. Dichorionic diamniotic twin pregnancy in second trimester Discussed that twins are  contraindicated for WB Has MFM scans upcoming FHR WNL x2  3. [redacted] weeks gestation of pregnancy  4. Secondary syphilis, relapse (treated) Treated in 09/2023  Preterm labor symptoms and general obstetric precautions including but not limited to vaginal bleeding, contractions, leaking of fluid and fetal movement were reviewed in detail with the patient. Please refer to After Visit Summary for other counseling recommendations.   Return in about 4 weeks (around 10/01/2024) for Routine prenatal care, [facility].  Future Appointments  Date Time Provider Department Center  09/17/2024  8:45 AM Sophie Burnard HERO, PT [facility] None  10/01/2024  3:55 PM Eldonna Suzen Octave, MD [facility]  10/17/2024  9:00 AM [facility] PROVIDER 1 [facility]  10/17/2024  9:30 AM [facility] US4 [facility]  10/28/2024  8:55 AM Lola Donnice HERO, MD [facility]  11/25/2024 11:15 AM [facility] PROVIDER [facility]  12/09/2024 11:15 AM [facility] PROVIDER [facility]  12/23/2024 10:35 AM [facility] PROVIDER [facility]    Suzen Octave Eldonna, MD

## 2024-09-04 ENCOUNTER — Telehealth: Payer: Self-pay

## 2024-09-05 ENCOUNTER — Other Ambulatory Visit: Payer: Self-pay

## 2024-09-05 ENCOUNTER — Ambulatory Visit

## 2024-09-05 ENCOUNTER — Other Ambulatory Visit

## 2024-09-05 DIAGNOSIS — O30042 Twin pregnancy, dichorionic/diamniotic, second trimester: Secondary | ICD-10-CM

## 2024-09-05 LAB — AFP, SERUM, OPEN SPINA BIFIDA
AFP MoM: 1.29
AFP Value: 69.5 ng/mL
Gest. Age on Collection Date: 20 wk
Maternal Age At EDD: 29.6 a
OSBR Risk 1 IN: 10000
Test Results:: NEGATIVE
Weight: 185 [lb_av]

## 2024-09-08 ENCOUNTER — Ambulatory Visit: Payer: Self-pay | Admitting: Family Medicine

## 2024-09-16 NOTE — Therapy (Signed)
 OUTPATIENT PHYSICAL THERAPY THORACOLUMBAR EVALUATION   Patient Name: Darlene Ortiz MRN: [id number] DOB:[birth year], 28 y.o., female Today's Date: 09/17/2024  END OF SESSION:  PT End of Session - 09/17/24 0926     Visit Number 1    Date for Recertification  11/12/24    Authorization Type [employer]-auth requested    PT Start Time 0855    PT Stop Time 0928    PT Time Calculation (min) 33 min    Activity Tolerance Patient tolerated treatment well    Behavior During Therapy [facility] for tasks assessed/performed          Past Medical History:  Diagnosis Date   Syphilis 11/2022   Past Surgical History:  Procedure Laterality Date   TOOTH EXTRACTION     Patient Active Problem List   Diagnosis Date Noted   Twin pregnancy, twins dichorionic and diamniotic 08/27/2024   Supervision of low-risk pregnancy 07/02/2024   History of postpartum hypertension 07/02/2024   History of maternal syphilis, currently pregnant 07/02/2024   Secondary syphilis, relapse (treated) 10/02/2023   ASCUS with positive high risk HPV cervical 12/22/2022      REFERRING PROVIDER: Eldonna Iha MD  REFERRING DIAG:  Diagnosis  O99.891,M54.9 (ICD-10-CM) - Back pain affecting pregnancy in second trimester    Rationale for Evaluation and Treatment: Rehabilitation  THERAPY DIAG:  Other low back pain - Plan: PT plan of care cert/re-cert  Muscle weakness (generalized) - Plan: PT plan of care cert/re-cert  Cramp and spasm - Plan: PT plan of care cert/re-cert  ONSET DATE: 06/2024  SUBJECTIVE:                                                                                                                                                                                           SUBJECTIVE STATEMENT: Pt presents to PT with LBP.  She is pregnant with twins and due 01/22/24.  She reports stabbing pain on the Lt side of the low back that radiates to the Lt LE.   PERTINENT HISTORY:  Pt is pregnant due  01/22/24  PAIN: 09/17/24 Are you having pain? Yes: NPRS scale: 5-10/10 Pain location: Bil low back and down legs Pain description: stabbing, ache sore  Aggravating factors: standing, holding child, carrying groceries  Relieving factors: relaxation, stretching, heat  PRECAUTIONS: Other: pt is pregnant with twins  RED FLAGS: None   WEIGHT BEARING RESTRICTIONS: No  FALLS:  Has patient fallen in last 6 months? No  LIVING ENVIRONMENT: Lives with: lives with their family Lives in: House/apartment  Has following equipment at home: None  OCCUPATION: works in Administrator, arts for [employer]  PLOF: Independent, Vocation/Vocational  requirements: standing for work in pharmacy, and Leisure: takes daughter to park  PATIENT GOALS: reduce LBP, perform job and home duties with less pain  NEXT MD VISIT: 10/01/24  OBJECTIVE:  Note: Objective measures were completed at Evaluation unless otherwise noted.  DIAGNOSTIC FINDINGS:  None   PATIENT SURVEYS:  09/16/24: Modified Oswestry: 16/50=32% disability   COGNITION: Overall cognitive status: Within functional limits for tasks assessed     SENSATION: WFL  MUSCLE LENGTH: WFLs  POSTURE: rounded shoulders, forward head, and anterior pelvic tilt  PALPATION: Trigger points and pain over bil lumbar paraspinals and gluteals (Rt>Lt)  LUMBAR ROM:   Full lumbar A/ROM   LOWER EXTREMITY MMT:    4+/5 hip and LE strength, 4/5 core strength     GAIT: Distance walked: 50 feet  Assistive device utilized: None Level of assistance: Complete Independence Comments: WFLs  TREATMENT DATE:  09/16/24:  Findings from evaluation discussed, pt educated on plan of care, HEP initiated.                                                                                                                                  If treatment provided at initial evaluation, no treatment charged due to lack of authorization.        PATIENT EDUCATION:  Education  details: [email or web address], postural changes at work Person educated: Patient Education method: Programmer, multimedia, Facilities manager, and Handouts Education comprehension: verbalized understanding and returned demonstration  HOME EXERCISE PROGRAM: Access Code: [email or web address] URL: [email or web address] Date: 09/17/2024 Prepared by: Burnard  Exercises - Cat-Camel to Child's Pose  - 2 x daily - 7 x weekly - 1 sets - 10 reps - 5 hold - Child's Pose Stretch  - 1 x daily - 7 x weekly - 1 sets - 3 reps - 20 hold - Child's Pose with Sidebending  - 2 x daily - 7 x weekly - 10 reps - 20 hold - Standing 'L' Stretch at Counter  - 3 x daily - 7 x weekly - 1 sets - 5 reps - 20 hold - Seated Hamstring Stretch  - 3 x daily - 7 x weekly - 1 sets - 3 reps - 20 hold - Seated Piriformis Stretch  - 3 x daily - 7 x weekly - 1 sets - 3 reps - 20 hold  ASSESSMENT:  CLINICAL IMPRESSION: Patient is a 28 y.o. female who was seen today for physical therapy evaluation and treatment for LBP. Pt is pregnant with twins and due date is 01/22/24.  Pain began ~3 months ago and pt rates low back pain as 5-10/10 and describes it as stabbing and burning and this extends down the legs at times.  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  She also has a 66 year old child that she cares for.  Pt with tension and trigger points in lumbar paraspinals and gluteals Rt>Lt.  Standing is  limited to 1.5 hours at work and they are allowing her to sit for short periods.  Patient will benefit from skilled PT to address the below impairments and improve overall function.   OBJECTIVE IMPAIRMENTS: decreased activity tolerance, decreased endurance, difficulty walking, decreased ROM, hypomobility, increased muscle spasms, impaired flexibility, improper body mechanics, postural dysfunction, and pain.   ACTIVITY LIMITATIONS: carrying, lifting, standing, stairs, locomotion level, and caring for others  PARTICIPATION  LIMITATIONS: meal prep, cleaning, laundry, shopping, community activity, and occupation  PERSONAL FACTORS: 1 comorbidity: pt is pregnant with twins are also affecting patient's functional outcome.   REHAB POTENTIAL: Good  CLINICAL DECISION MAKING: Stable/uncomplicated  EVALUATION COMPLEXITY: Low   GOALS: Goals reviewed with patient? Yes  SHORT TERM GOALS: Target date: 10/15/2024    Be independent in initial HEP Baseline: Goal status: INITIAL  2.  Demonstrate and verbalize body mechanics modifications for lumbar protection with daily tasks  Baseline:  Goal status: INITIAL  3.  Report < or = to 7/10 LBP with standing for work to improve tolerance  Baseline: 10/10 Goal status: INITIAL    LONG TERM GOALS: Target date: 11/12/2024    Be independent in advanced HEP  Baseline:  Goal status: INITIAL  2.  Improve Modified Oswestry to < or = to 10/50 (20% disability) to improve function at work and with care of her child Baseline: 16/50 Goal status: INITIAL  3.  Report > or = to 70% reduction in LBP with standing at work Baseline: 10/10 Goal status: INITIAL  4.  Stand for 2-2.5 hours at work before LBP limits this  Baseline: 1.5 hours max Goal status: INITIAL   PLAN:  PT FREQUENCY: 2x/week  PT DURATION: 8 weeks  PLANNED INTERVENTIONS: 97110-Therapeutic exercises, 97530- Therapeutic activity, 97112- Neuromuscular re-education, 97535- Self Care, 02859- Manual therapy, [phone]- Canalith repositioning, [id number]- Aquatic Therapy, [id number]- Electrical stimulation (unattended), [phone]- Electrical stimulation (manual), 20560 (1-2 muscles), 20561 (3+ muscles)- Dry Needling, Patient/Family education, Balance training, Taping, Joint mobilization, Vestibular training, Cryotherapy, Moist heat, and Biofeedback.  PLAN FOR NEXT SESSION: review HEP, begin core strength, add open book, tissue mobility (addaday or other manual work)  Burnard Joy, PT 09/17/24 10:02 AM   [facility] [address], [address] [place], [place] [address] Phone # [phone] Fax [phone]

## 2024-09-17 ENCOUNTER — Other Ambulatory Visit: Payer: Self-pay

## 2024-09-17 ENCOUNTER — Ambulatory Visit: Attending: Family Medicine

## 2024-09-17 DIAGNOSIS — R252 Cramp and spasm: Secondary | ICD-10-CM | POA: Insufficient documentation

## 2024-09-17 DIAGNOSIS — M5459 Other low back pain: Secondary | ICD-10-CM | POA: Insufficient documentation

## 2024-09-17 DIAGNOSIS — O99891 Other specified diseases and conditions complicating pregnancy: Secondary | ICD-10-CM | POA: Diagnosis not present

## 2024-09-17 DIAGNOSIS — M6281 Muscle weakness (generalized): Secondary | ICD-10-CM | POA: Diagnosis present

## 2024-09-17 DIAGNOSIS — M549 Dorsalgia, unspecified: Secondary | ICD-10-CM | POA: Insufficient documentation

## 2024-09-23 NOTE — Therapy (Signed)
 OUTPATIENT PHYSICAL THERAPY THORACOLUMBAR TREATMENT   Patient Name: Darlene Ortiz MRN: [id number] DOB:[birth year], 28 y.o., female Today's Date: 09/24/2024  END OF SESSION:  PT End of Session - 09/24/24 0848     Visit Number 2    Date for Recertification  11/12/24    Authorization Type [employer]-auth requested    PT Start Time 0805    PT Stop Time 0845    PT Time Calculation (min) 40 min    Activity Tolerance Patient tolerated treatment well    Behavior During Therapy [facility] for tasks assessed/performed           Past Medical History:  Diagnosis Date   Syphilis 11/2022   Past Surgical History:  Procedure Laterality Date   TOOTH EXTRACTION     Patient Active Problem List   Diagnosis Date Noted   Twin pregnancy, twins dichorionic and diamniotic 08/27/2024   Supervision of low-risk pregnancy 07/02/2024   History of postpartum hypertension 07/02/2024   History of maternal syphilis, currently pregnant 07/02/2024   Secondary syphilis, relapse (treated) 10/02/2023   ASCUS with positive high risk HPV cervical 12/22/2022      REFERRING PROVIDER: Eldonna Iha MD  REFERRING DIAG:  Diagnosis  O99.891,M54.9 (ICD-10-CM) - Back pain affecting pregnancy in second trimester    Rationale for Evaluation and Treatment: Rehabilitation  THERAPY DIAG:  Other low back pain  Muscle weakness (generalized)  Cramp and spasm  ONSET DATE: 06/2024  SUBJECTIVE:                                                                                                                                                                                           SUBJECTIVE STATEMENT: The exercises are helpful, but they havent helped the pain.    Eval: Pt presents to PT with LBP.  She is pregnant with twins and due 01/22/24.  She reports stabbing pain on the Lt side of the low back that radiates to the Lt LE.   PERTINENT HISTORY:  Pt is pregnant due 01/22/24  PAIN: 09/17/24 Are you having  pain? Yes: NPRS scale: 8/10 Pain location: Bil low back and down legs Pain description: stabbing, ache sore  Aggravating factors: standing, holding child, carrying groceries  Relieving factors: relaxation, stretching, heat  PRECAUTIONS: Other: pt is pregnant with twins  RED FLAGS: None   WEIGHT BEARING RESTRICTIONS: No  FALLS:  Has patient fallen in last 6 months? 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  DIAGNOSTIC FINDINGS:  None   PATIENT SURVEYS:  09/16/24: Modified Oswestry: 16/50=32% disability   COGNITION: Overall cognitive status: Within functional limits for tasks assessed     SENSATION: WFL  MUSCLE LENGTH: WFLs  POSTURE: rounded shoulders, forward head, and anterior pelvic tilt  PALPATION: Trigger points and pain over bil lumbar paraspinals and gluteals (Rt>Lt)  LUMBAR ROM:   Full lumbar A/ROM   LOWER EXTREMITY MMT:    4+/5 hip and LE strength, 4/5 core strength     GAIT: Distance walked: 50 feet  Assistive device utilized: None Level of assistance: Complete Independence Comments: WFLs  TREATMENT DATE:  09/24/24 Standing L at barre Seated HS stretch with leg on mat table 2x30 sec Seated fig 4 2 x 30 sec Seated cat/cow Cat camel to child's pose Childs pose stretch 3 way Open book x 10 B with knee on foam SL hip IR with knee on foam x 10 B SL hip ER x 10 B Hooklying with green band pull downs 2x10 IASTM x 4 min with ADDaday   09/16/24:  Findings from evaluation discussed, pt educated on plan  of care, HEP initiated.                                                                                                                                  If treatment provided at initial evaluation, no treatment charged due to lack of authorization.        PATIENT EDUCATION:  Education details: [email or web address], postural changes at work Person educated: Patient Education method: Programmer, multimedia, Facilities manager, and Handouts Education comprehension: verbalized understanding and returned demonstration  HOME EXERCISE PROGRAM: Access Code: [email or web address] URL: [email or web address] Date: 09/24/2024 Prepared by: Mliss  Exercises - Cat-Camel to Child's Pose  - 2 x daily - 7 x weekly - 1 sets - 10 reps - 5 hold - Child's Pose Stretch  - 1 x daily - 7 x weekly - 1 sets - 3 reps - 20 hold - Child's Pose with Sidebending  - 2 x daily - 7 x weekly - 10 reps - 20 hold - Standing 'L' Stretch at [employer]  - 3 x daily - 7 x weekly - 1 sets - 5 reps - 20 hold - Seated Hamstring Stretch  - 3 x daily - 7 x weekly - 1 sets - 3 reps - 20 hold - Seated Piriformis Stretch  - 3 x daily - 7 x weekly - 1 sets - 3 reps - 20 hold - Sidelying Open Book Thoracic Rotation with Knee on Foam Roll  - 1 x daily - 7 x weekly - 2 sets - 10 reps - Deep Squat with Pelvic Floor Relaxation  - 1 x daily - 7  x weekly - 2 sets - 10 reps - Sidelying Reverse Clamshell  - 1 x daily - 7 x weekly - 2 sets - 10 reps  ASSESSMENT:  CLINICAL IMPRESSION: Patient presents for first visit after evaluation. She tolerated all TE without increased pain. She gets relief with neutral spine positioning and really likes childs pose, open book aand the L stretch. Still demonstrates antalgic gait at end of session.   Eval: Patient is a 28 y.o. female who was seen today for physical therapy evaluation and treatment for LBP. Pt is pregnant with twins and due date is 01/22/24.  Pain began ~3 months ago and pt rates low back pain as 5-10/10 and describes  it as stabbing and burning and this extends down the legs at times.  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  She also has a 60 year old child that she cares for.  Pt with tension and trigger points in lumbar paraspinals and gluteals Rt>Lt.  Standing is limited to 1.5 hours at work and they are allowing her to sit for short periods.  Patient will benefit from skilled PT to address the below impairments and improve overall function.   OBJECTIVE IMPAIRMENTS: decreased activity tolerance, decreased endurance, difficulty walking, decreased ROM, hypomobility, increased muscle spasms, impaired flexibility, improper body mechanics, postural dysfunction, and pain.   ACTIVITY LIMITATIONS: carrying, lifting, standing, stairs, locomotion level, and caring for others  PARTICIPATION LIMITATIONS: meal prep, cleaning, laundry, shopping, community activity, and occupation  PERSONAL FACTORS: 1 comorbidity: pt is pregnant with twins are also affecting patient's functional outcome.   REHAB POTENTIAL: Good  CLINICAL DECISION MAKING: Stable/uncomplicated  EVALUATION COMPLEXITY: Low   GOALS: Goals reviewed with patient? Yes  SHORT TERM GOALS: Target date: 10/15/2024    Be independent in initial HEP Baseline: Goal status: INITIAL  2.  Demonstrate and verbalize body mechanics modifications for lumbar protection with daily tasks  Baseline:  Goal status: INITIAL  3.  Report < or = to 7/10 LBP with standing for work to improve tolerance  Baseline: 10/10 Goal status: INITIAL    LONG TERM GOALS: Target date: 11/12/2024    Be independent in advanced HEP  Baseline:  Goal status: INITIAL  2.  Improve Modified Oswestry to < or = to 10/50 (20% disability) to improve function at work and with care of her child Baseline: 16/50 Goal status: INITIAL  3.  Report > or = to 70% reduction in LBP with standing at work Baseline: 10/10 Goal status: INITIAL  4.   Stand for 2-2.5 hours at work before LBP limits this  Baseline: 1.5 hours max Goal status: INITIAL   PLAN:  PT FREQUENCY: 2x/week  PT DURATION: 8 weeks  PLANNED INTERVENTIONS: 97110-Therapeutic exercises, 97530- Therapeutic activity, 97112- Neuromuscular re-education, 97535- Self Care, 02859- Manual therapy, [phone]- Canalith repositioning, [id number]- Aquatic Therapy, H9716- Electrical stimulation (unattended), [phone]- Electrical stimulation (manual), 20560 (1-2 muscles), 20561 (3+ muscles)- Dry Needling, Patient/Family education, Balance training, Taping, Joint mobilization, Vestibular training, Cryotherapy, Moist heat, and Biofeedback.  PLAN FOR NEXT SESSION: review HEP, begin core strength, add open book, tissue mobility (addaday or other manual work)  Mliss Cummins, PT  09/24/24 8:48 AM   [facility] [address], [address] [place], [place] [address] Phone # [phone] Fax [phone]

## 2024-09-24 ENCOUNTER — Ambulatory Visit: Admitting: Physical Therapy

## 2024-09-24 ENCOUNTER — Encounter: Payer: Self-pay | Admitting: Physical Therapy

## 2024-09-24 DIAGNOSIS — M6281 Muscle weakness (generalized): Secondary | ICD-10-CM

## 2024-09-24 DIAGNOSIS — M5459 Other low back pain: Secondary | ICD-10-CM

## 2024-09-24 DIAGNOSIS — R252 Cramp and spasm: Secondary | ICD-10-CM

## 2024-09-25 DIAGNOSIS — Z0289 Encounter for other administrative examinations: Secondary | ICD-10-CM

## 2024-09-26 ENCOUNTER — Other Ambulatory Visit: Payer: Self-pay

## 2024-09-26 ENCOUNTER — Ambulatory Visit (INDEPENDENT_AMBULATORY_CARE_PROVIDER_SITE_OTHER): Admitting: Family Medicine

## 2024-09-26 ENCOUNTER — Encounter: Payer: Self-pay | Admitting: Family Medicine

## 2024-09-26 VITALS — BP 118/76 | HR 102 | Wt 184.0 lb

## 2024-09-26 DIAGNOSIS — R87810 Cervical high risk human papillomavirus (HPV) DNA test positive: Secondary | ICD-10-CM

## 2024-09-26 DIAGNOSIS — O30042 Twin pregnancy, dichorionic/diamniotic, second trimester: Secondary | ICD-10-CM

## 2024-09-26 DIAGNOSIS — Z8679 Personal history of other diseases of the circulatory system: Secondary | ICD-10-CM

## 2024-09-26 DIAGNOSIS — M545 Low back pain, unspecified: Secondary | ICD-10-CM

## 2024-09-26 DIAGNOSIS — R87610 Atypical squamous cells of undetermined significance on cytologic smear of cervix (ASC-US): Secondary | ICD-10-CM

## 2024-09-26 DIAGNOSIS — Z3A23 23 weeks gestation of pregnancy: Secondary | ICD-10-CM | POA: Diagnosis not present

## 2024-09-26 DIAGNOSIS — O0992 Supervision of high risk pregnancy, unspecified, second trimester: Secondary | ICD-10-CM | POA: Diagnosis not present

## 2024-09-26 DIAGNOSIS — Z8759 Personal history of other complications of pregnancy, childbirth and the puerperium: Secondary | ICD-10-CM

## 2024-09-26 DIAGNOSIS — A5149 Other secondary syphilitic conditions: Secondary | ICD-10-CM

## 2024-09-26 DIAGNOSIS — G8929 Other chronic pain: Secondary | ICD-10-CM

## 2024-09-26 MED ORDER — CYCLOBENZAPRINE HCL 10 MG PO TABS: 10.0000 mg | ORAL_TABLET | Freq: Three times a day (TID) | ORAL | 1 refills | Status: DC | PRN

## 2024-09-26 NOTE — Progress Notes (Signed)
   Subjective:  Darlene Ortiz is a 28 y.o. G3P1011 at [redacted]w[redacted]d being seen today for ongoing prenatal care.  She is currently monitored for the following issues for this high-risk pregnancy and has ASCUS with positive high risk HPV cervical; Secondary syphilis, relapse (treated); Supervision of high risk pregnancy, antepartum, second trimester; History of postpartum hypertension; History of maternal syphilis, currently pregnant; and Twin pregnancy, twins dichorionic and diamniotic on their problem list.  Patient reports no complaints.  Contractions: Not present. Vag. Bleeding: None.  Movement: Present. Denies leaking of fluid.   The following portions of the patient's history were reviewed and updated as appropriate: allergies, current medications, past family history, past medical history, past social history, past surgical history and problem list. Problem list updated.  Objective:   Vitals:   09/26/24 1055  BP: 118/76  Pulse: (!) 102  Weight: 184 lb (83.5 kg)    Fetal Status: Fetal Heart Rate (bpm): 141/137 (bedside US . A cephalic, B transverse)   Movement: Present     General:  Alert, oriented and cooperative. Patient is in no acute distress.  Skin: Skin is warm and dry. No rash noted.   Cardiovascular: Normal heart rate noted  Respiratory: Normal respiratory effort, no problems with respiration noted  Abdomen: Soft, gravid, appropriate for gestational age. Pain/Pressure: Absent     Pelvic: Vag. Bleeding: None     Cervical exam deferred        Extremities: Normal range of motion.  Edema: None  Mental Status: Normal mood and affect. Normal behavior. Normal judgment and thought content.   Urinalysis:      Assessment and Plan:  Pregnancy: G3P1011 at [redacted]w[redacted]d  1. [redacted] weeks gestation of pregnancy (Primary)   2. Supervision of high risk pregnancy, antepartum, second trimester BP and both FHR normal Has not had anatomy scan yet and doesn't appear to have one scheduled, called MFM and she  is top of the waitlist, they will call her asap  PT is helping her back pain but still really struggling at work, would like to come out, discussed this is fine, submitted FMLA already to front desk, discussed 7-10 business day turnaround Discussed trial of flexeril for ongoing back pain  3. Dichorionic diamniotic twin pregnancy in second trimester Has only had 11wk dating US , will get anatomy scan scheduled asap Taking ASA 162  4. Secondary syphilis, relapse (treated) Stable titer of 1:2 c/w adequate treatment  5. History of postpartum hypertension On ASA  6. ASCUS with positive high risk HPV cervical Plan for diagnostic/therapeutic LEEP pp  Preterm labor symptoms and general obstetric precautions including but not limited to vaginal bleeding, contractions, leaking of fluid and fetal movement were reviewed in detail with the patient. Please refer to After Visit Summary for other counseling recommendations.  Return in 4 weeks (on 10/24/2024) for Dyad patient, ob visit, 28 wk labs.   Lola Donnice HERO, MD

## 2024-09-26 NOTE — Patient Instructions (Signed)

## 2024-09-29 ENCOUNTER — Other Ambulatory Visit (HOSPITAL_COMMUNITY): Payer: Self-pay

## 2024-09-30 ENCOUNTER — Ambulatory Visit

## 2024-09-30 ENCOUNTER — Telehealth: Payer: Self-pay | Admitting: Rehabilitative and Restorative Service Providers"

## 2024-09-30 DIAGNOSIS — M5459 Other low back pain: Secondary | ICD-10-CM

## 2024-09-30 DIAGNOSIS — M6281 Muscle weakness (generalized): Secondary | ICD-10-CM

## 2024-09-30 DIAGNOSIS — R252 Cramp and spasm: Secondary | ICD-10-CM

## 2024-09-30 NOTE — Therapy (Addendum)
 OUTPATIENT PHYSICAL THERAPY THORACOLUMBAR TREATMENT   Patient Name: Rainn Zupko MRN: [id number] DOB:November 08, 1996, 28 y.o., female Today's Date: 09/30/2024  END OF SESSION:  PT End of Session - 09/30/24 0858     Visit Number 3    Date for Recertification  11/12/24    Authorization Type [employer]    Authorization Time Period 09/17/24-11/15/24; dn form signed 09/30/24    Authorization - Visit Number 2    Authorization - Number of Visits 6    PT Start Time 0857    PT Stop Time 0929    PT Time Calculation (min) 32 min    Activity Tolerance Patient tolerated treatment well    Behavior During Therapy [facility] for tasks assessed/performed            Past Medical History:  Diagnosis Date   Syphilis 11/2022   Past Surgical History:  Procedure Laterality Date   TOOTH EXTRACTION     Patient Active Problem List   Diagnosis Date Noted   Twin pregnancy, twins dichorionic and diamniotic 08/27/2024   Supervision of high risk pregnancy, antepartum, second trimester 07/02/2024   History of postpartum hypertension 07/02/2024   History of maternal syphilis, currently pregnant 07/02/2024   Secondary syphilis, relapse (treated) 10/02/2023   ASCUS with positive high risk HPV cervical 12/22/2022      REFERRING PROVIDER: Eldonna Iha MD  REFERRING DIAG:  Diagnosis  O99.891,M54.9 (ICD-10-CM) - Back pain affecting pregnancy in second trimester    Rationale for Evaluation and Treatment: Rehabilitation  THERAPY DIAG:  Other low back pain  Muscle weakness (generalized)  Cramp and spasm  ONSET DATE: 06/2024  SUBJECTIVE:                                                                                                                                                                                           SUBJECTIVE STATEMENT: Pt states that she feels like PT is helpful, but there's only so much progress she will see coming once a week. She does not feel like right now she has the  time to be seen more than 1x/week.    Eval: Pt presents to PT with LBP.  She is pregnant with twins and due 01/22/24.  She reports stabbing pain on the Lt side of the low back that radiates to the Lt LE.   PERTINENT HISTORY:  Pt is pregnant due 01/22/24  PAIN: 09/30/24 Are you having pain? Yes: NPRS scale: 8/10 Pain location: Bil low back and down legs Pain description: stabbing, ache sore  Aggravating factors: standing, holding child, carrying groceries  Relieving factors: relaxation, stretching, heat  PRECAUTIONS: Other: pt is  pregnant with twins  RED FLAGS: None   WEIGHT BEARING RESTRICTIONS: No  FALLS:  Has patient fallen in last 6 months? 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  DIAGNOSTIC FINDINGS:  None   PATIENT SURVEYS:  09/16/24: Modified Oswestry: 16/50=32% disability   COGNITION: Overall cognitive status: Within functional limits for tasks assessed     SENSATION: WFL  MUSCLE LENGTH: WFLs  POSTURE: rounded shoulders, forward head, and anterior pelvic tilt  PALPATION: Trigger points and pain over bil lumbar paraspinals and gluteals (Rt>Lt)  LUMBAR ROM:   Full lumbar A/ROM   LOWER EXTREMITY MMT:    4+/5 hip and LE strength, 4/5 core strength     GAIT: Distance walked: 50 feet  Assistive device utilized: None Level of assistance: Complete Independence Comments: WFLs  TREATMENT DATE:  09/30/24 Manual: Bil sidelying gluteal trigger point release and soft tissue mobilization to lumbar paraspinals   Neuromuscular re-education: Transversus abdominus training with multimodal cues for improved motor control and breath coordination 10x Posterior pelvic tilts in standing again wall - flattening back against wall - exhale contract transversus abdominus to posteriorly tilt 2 x 10 Therapeutic activities: Pt education: Pelvic stability belt Standing positions with more support- propping one foot on stool Self massage in standing using tennis ball to bil glutes Practiced bed mobility with use of breathing and transversus abdominus to help reduce pain and improve ease   09/24/24 Standing L at barre Seated HS stretch with leg on mat table 2x30 sec Seated fig 4 2 x 30 sec Seated cat/cow Cat camel to child's pose Childs pose stretch 3 way Open book x 10 B with knee on foam SL hip IR with knee on foam x 10 B SL hip ER x 10 B Hooklying with green band pull downs 2x10 IASTM x 4 min with ADDaday   09/16/24:  Findings from evaluation discussed, pt educated on plan of care, HEP initiated.                                                                                                                                  If treatment provided at initial evaluation, no treatment charged due to lack of authorization.        PATIENT EDUCATION:  Education details: [email or web address], postural changes at work Person educated: Patient Education method: Programmer, Multimedia, Facilities Manager, and Handouts Education comprehension: verbalized understanding and returned demonstration  HOME EXERCISE PROGRAM: Access Code: [email or web address] URL: [email or web address] Date: 09/24/2024 Prepared by: Mliss  Exercises - Cat-Camel to Child's Pose  - 2 x daily -  7 x weekly - 1 sets - 10 reps - 5 hold - Child's Pose Stretch  - 1 x daily - 7 x weekly - 1 sets - 3 reps - 20 hold - Child's Pose with Sidebending  - 2 x daily - 7 x weekly - 10 reps - 20 hold - Standing 'L' Stretch at [employer]  - 3 x daily - 7 x weekly - 1 sets -  5 reps - 20 hold - Seated Hamstring Stretch  - 3 x daily - 7 x weekly - 1 sets - 3 reps - 20 hold - Seated Piriformis Stretch  - 3 x daily - 7 x weekly - 1 sets - 3 reps - 20 hold - Sidelying Open Book Thoracic Rotation with Knee on Foam Roll  - 1 x daily - 7 x weekly - 2 sets - 10 reps - Deep Squat with Pelvic Floor Relaxation  - 1 x daily - 7 x weekly - 2 sets - 10 reps - Sidelying Reverse Clamshell  - 1 x daily - 7 x weekly - 2 sets - 10 reps  ASSESSMENT:  CLINICAL IMPRESSION: Pt reported no pressure or pain after wearing pelvic stability belt for 10 minutes and performing posterior pelvic tilts against wall. We discussed using stool to prop foot on standing at work to help reduce pain and increase lumbar flexion. We went over core activation and practiced using through bed mobility to decrease pain and improve ease. Manual techniques performed to low back and bil glutes to help reduce painful restriction and trigger points. We discussed benefit of dry needling and will consider in future treatment sessions. She will continue to benefit from skilled PT intervention in order to address deficits and improve pelvic/low back pain.    Eval: Patient is a 28 y.o. female who was seen today for physical therapy evaluation and treatment for LBP. Pt is pregnant with twins and due date is 01/22/24.  Pain began ~3 months ago and pt rates low back pain as 5-10/10 and describes it as stabbing and burning and this extends down the legs at times.  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  She also has a 88 year old child that she cares for.  Pt with tension and trigger points in lumbar paraspinals and gluteals Rt>Lt.  Standing is limited to 1.5 hours at work and they are allowing her to sit for short periods.  Patient will benefit from skilled PT to address the below impairments and improve overall function.   OBJECTIVE IMPAIRMENTS: decreased activity tolerance, decreased  endurance, difficulty walking, decreased ROM, hypomobility, increased muscle spasms, impaired flexibility, improper body mechanics, postural dysfunction, and pain.   ACTIVITY LIMITATIONS: carrying, lifting, standing, stairs, locomotion level, and caring for others  PARTICIPATION LIMITATIONS: meal prep, cleaning, laundry, shopping, community activity, and occupation  PERSONAL FACTORS: 1 comorbidity: pt is pregnant with twins are also affecting patient's functional outcome.   REHAB POTENTIAL: Good  CLINICAL DECISION MAKING: Stable/uncomplicated  EVALUATION COMPLEXITY: Low   GOALS: Goals reviewed with patient? Yes  SHORT TERM GOALS: Target date: 10/15/2024    Be independent in initial HEP Baseline: Goal status: INITIAL  2.  Demonstrate and verbalize body mechanics modifications for lumbar protection with daily tasks  Baseline:  Goal status: INITIAL  3.  Report < or = to 7/10 LBP with standing for work to improve tolerance  Baseline: 10/10 Goal status: INITIAL    LONG TERM GOALS:  Target date: 11/12/2024    Be independent in advanced HEP  Baseline:  Goal status: INITIAL  2.  Improve Modified Oswestry to < or = to 10/50 (20% disability) to improve function at work and with care of her child Baseline: 16/50 Goal status: INITIAL  3.  Report > or = to 70% reduction in LBP with standing at work Baseline: 10/10 Goal status: INITIAL  4.  Stand for 2-2.5 hours at work before LBP limits this  Baseline: 1.5 hours max Goal status: INITIAL   PLAN:  PT FREQUENCY: 2x/week  PT DURATION: 8 weeks  PLANNED INTERVENTIONS: 97110-Therapeutic exercises, 97530- Therapeutic activity, 97112- Neuromuscular re-education, 97535- Self Care, 02859- Manual therapy, [phone]- Canalith repositioning, [id number]- Aquatic Therapy, H9716- Electrical stimulation (unattended), [phone]- Electrical stimulation (manual), 20560 (1-2 muscles), 20561 (3+ muscles)- Dry Needling, Patient/Family education, Balance  training, Taping, Joint mobilization, Vestibular training, Cryotherapy, Moist heat, and Biofeedback.  PLAN FOR NEXT SESSION: review HEP, begin core strength, add open book, tissue mobility (addaday or other manual work)  Mliss Cummins, PT  09/30/24 9:56 AM   [facility] [address] [place], [place] [address] Phone # [phone] Fax [phone]

## 2024-09-30 NOTE — Telephone Encounter (Signed)
 Called patient secondary to missed PT appointment on 09/30/2024 at 8:00 AM.  Patient stated that she was not going to be able to make the appointment.  Reminded patient of the attendance policy and that she needs to call 24 hours in advance to cancel appointments.  She verbalizes understanding.  Patient offered an 8:45 AM appointment and she states that she can make that and will be leaving her home now to arrive for the appointment.

## 2024-09-30 NOTE — Patient Instructions (Addendum)
 Stability belt Stand with one foot propped on stool - switch frequently Practice activating core when you are rolling in bed Standing massage to your butt muscles with tennis ball    Trigger Point Dry Needling  What is Trigger Point Dry Needling (DN)? DN is a physical therapy technique used to treat muscle pain and dysfunction. Specifically, DN helps deactivate muscle trigger points (muscle knots).  A thin filiform needle is used to penetrate the skin and stimulate the underlying trigger point. The goal is for a local twitch response (LTR) to occur and for the trigger point to relax. No medication of any kind is injected during the procedure.   What Does Trigger Point Dry Needling Feel Like?  The procedure feels different for each individual patient. Some patients report that they do not actually feel the needle enter the skin and overall the process is not painful. Very mild bleeding may occur. However, many patients feel a deep cramping in the muscle in which the needle was inserted. This is the local twitch response.   How Will I feel after the treatment? Soreness is normal, and the onset of soreness may not occur for a few hours. Typically this soreness does not last longer than two days.  Bruising is uncommon, however; ice can be used to decrease any possible bruising.  In rare cases feeling tired or nauseous after the treatment is normal. In addition, your symptoms may get worse before they get better, this period will typically not last longer than 24 hours.   What Can I do After My Treatment? Increase your hydration by drinking more water  for the next 24 hours.  You may place ice or heat on the areas treated that have become sore, however, do not use heat on inflamed or bruised areas. Heat often brings more relief post needling. You can continue your regular activities, but vigorous activity is not recommended initially after the treatment for 24 hours. DN is best combined with other  physical therapy such as strengthening, stretching, and other therapies.   What are the complications? While your therapist has had extensive training in minimizing the risks of trigger point dry needling, it is important to understand the risks of any procedure.  Risks include bleeding, pain, fatigue, hematoma, infection, vertigo, nausea or nerve involvement. Monitor for any changes to your skin or sensation. Contact your therapist or MD with concerns.  A rare but serious complication is a pneumothorax over or near your middle and upper chest and back If you have dry needling in this area, monitor for the following symptoms: Shortness of breath on exertion and/or Difficulty taking a deep breath and/or Chest Pain and/or A dry cough If any of the above symptoms develop, please go to the nearest emergency room or call 911. Tell them you had dry needling over your thorax and report any symptoms you are having. Please follow-up with your treating therapist after you complete the medical evaluation.   [facility]  [address] [place] [address].  [phone]

## 2024-10-01 ENCOUNTER — Encounter: Admitting: Family Medicine

## 2024-10-01 ENCOUNTER — Ambulatory Visit (INDEPENDENT_AMBULATORY_CARE_PROVIDER_SITE_OTHER)

## 2024-10-01 VITALS — BP 118/74 | HR 86 | Wt 185.0 lb

## 2024-10-01 DIAGNOSIS — Z23 Encounter for immunization: Secondary | ICD-10-CM | POA: Diagnosis not present

## 2024-10-01 NOTE — Progress Notes (Signed)
 Patient seen in office for vaccine only visit. Received flu vaccine in left deltoid. Next prenatal appointment is 11/25.

## 2024-10-06 NOTE — Telephone Encounter (Signed)
 This encounter has been addressed on an encounter on  10/02/24

## 2024-10-07 ENCOUNTER — Other Ambulatory Visit

## 2024-10-07 ENCOUNTER — Ambulatory Visit

## 2024-10-07 ENCOUNTER — Ambulatory Visit: Attending: Family Medicine

## 2024-10-07 DIAGNOSIS — M5459 Other low back pain: Secondary | ICD-10-CM | POA: Insufficient documentation

## 2024-10-07 DIAGNOSIS — R252 Cramp and spasm: Secondary | ICD-10-CM | POA: Insufficient documentation

## 2024-10-07 DIAGNOSIS — M6281 Muscle weakness (generalized): Secondary | ICD-10-CM | POA: Insufficient documentation

## 2024-10-07 NOTE — Therapy (Signed)
 OUTPATIENT PHYSICAL THERAPY THORACOLUMBAR TREATMENT   Patient Name: Darlene Ortiz MRN: [id number] DOB:Sep 05, 1996, 28 y.o., female Today's Date: 10/07/2024  END OF SESSION:  PT End of Session - 10/07/24 0925     Visit Number 4    Date for Recertification  11/12/24    Authorization Type [employer]    Authorization Time Period 09/17/24-11/15/24; dn form signed 09/30/24    Authorization - Visit Number 3    Authorization - Number of Visits 6    PT Start Time 0807    PT Stop Time 0847    PT Time Calculation (min) 40 min    Activity Tolerance Patient tolerated treatment well    Behavior During Therapy [facility] for tasks assessed/performed             Past Medical History:  Diagnosis Date   Syphilis 11/2022   Past Surgical History:  Procedure Laterality Date   TOOTH EXTRACTION     Patient Active Problem List   Diagnosis Date Noted   Twin pregnancy, twins dichorionic and diamniotic 08/27/2024   Supervision of high risk pregnancy, antepartum, second trimester 07/02/2024   History of postpartum hypertension 07/02/2024   History of maternal syphilis, currently pregnant 07/02/2024   Secondary syphilis, relapse (treated) 10/02/2023   ASCUS with positive high risk HPV cervical 12/22/2022      REFERRING PROVIDER: Eldonna Iha MD  REFERRING DIAG:  Diagnosis  O99.891,M54.9 (ICD-10-CM) - Back pain affecting pregnancy in second trimester    Rationale for Evaluation and Treatment: Rehabilitation  THERAPY DIAG:  Other low back pain  Muscle weakness (generalized)  Cramp and spasm  ONSET DATE: 06/2024  SUBJECTIVE:                                                                                                                                                                                           SUBJECTIVE STATEMENT: I am wearing the SI belt and it relieves the pressure from my pelvic area.  I wear it for 1/2 of my work shift.  It doesn't change my pain in the back. I  have more pain when I wake up, I think it is my bed.    Eval: Pt presents to PT with LBP.  She is pregnant with twins and due 01/22/24.  She reports stabbing pain on the Lt side of the low back that radiates to the Lt LE.   PERTINENT HISTORY:  Pt is pregnant due 01/22/24  PAIN: 10/07/24 Are you having pain? Yes: NPRS scale: 7/10 Pain location: Bil low back and down legs Pain description: stabbing, ache sore  Aggravating factors: standing, holding child, carrying groceries  Relieving factors: relaxation, stretching, heat  PRECAUTIONS: Other: pt is pregnant with twins  RED FLAGS: None   WEIGHT BEARING RESTRICTIONS: No  FALLS:  Has patient fallen in last 6 months? 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  DIAGNOSTIC FINDINGS:  None   PATIENT SURVEYS:  09/16/24: Modified Oswestry: 16/50=32% disability   COGNITION: Overall cognitive status: Within functional limits for tasks assessed     SENSATION: WFL  MUSCLE LENGTH: WFLs  POSTURE: rounded shoulders, forward head, and anterior pelvic tilt  PALPATION: Trigger points and pain over bil lumbar paraspinals and gluteals (Rt>Lt)  LUMBAR ROM:   Full lumbar A/ROM   LOWER EXTREMITY MMT:    4+/5 hip and LE strength, 4/5 core strength     GAIT: Distance walked: 50 feet  Assistive device utilized: None Level of assistance: Complete Independence Comments: WFLs  TREATMENT DATE:  10/07/24 Manual: With Addaday  Bil sidelying gluteals with PT monitoring for  response Neuromuscular re-education: Clam and reverse clam with TA activation x10 bil each Therapeutic exercise: Standing hamstring stretch 3x20 seconds Ball roll outs 3 ways x 8 reps NuStep: level 3x 6 min-PT present to discuss progress   09/30/24 Manual: Bil sidelying gluteal trigger point release and soft tissue mobilization to lumbar paraspinals  Neuromuscular re-education: Transversus abdominus training with multimodal cues for improved motor control and breath coordination 10x Posterior pelvic tilts in standing again wall - flattening back against wall - exhale contract transversus abdominus to posteriorly tilt 2 x 10 Therapeutic activities: Pt education: Pelvic stability belt Standing positions with more support- propping one foot on stool Self massage in standing using tennis ball to bil glutes Practiced bed mobility with use of breathing and transversus abdominus to help reduce pain and improve ease   09/24/24 Standing L at barre Seated HS stretch with leg on mat table 2x30 sec Seated fig 4 2 x 30 sec Seated cat/cow Cat camel to child's pose Childs pose stretch 3 way Open book x 10 B with knee on foam SL hip IR with knee on foam x 10 B SL hip ER x 10 B Hooklying with green band pull downs 2x10 IASTM x 4 min with ADDaday   09/16/24:  Findings from evaluation discussed, pt educated on plan of care, HEP initiated.                                                                                                                                  If treatment provided at initial evaluation, no treatment charged due to lack of  authorization.        PATIENT EDUCATION:  Education details: [email or web address], postural changes at work Person educated: Patient Education method: Programmer, Multimedia, Facilities Manager, and Handouts Education comprehension: verbalized understanding and returned demonstration  HOME EXERCISE PROGRAM: Access Code: [email or web address] URL:  [email or web address] Date: 09/24/2024 Prepared by: Mliss  Exercises - Cat-Camel to Child's Pose  - 2 x daily - 7 x weekly - 1 sets - 10 reps - 5 hold - Child's Pose Stretch  - 1 x daily - 7 x weekly - 1 sets - 3 reps - 20 hold - Child's Pose with Sidebending  - 2 x daily - 7 x weekly - 10 reps - 20 hold - Standing 'L' Stretch at Counter  - 3 x daily - 7 x weekly - 1 sets - 5 reps - 20 hold - Seated Hamstring Stretch  - 3 x daily - 7 x weekly - 1 sets - 3 reps - 20 hold - Seated Piriformis Stretch  - 3 x daily - 7 x weekly - 1 sets - 3 reps - 20 hold - Sidelying Open Book Thoracic Rotation with Knee on Foam Roll  - 1 x daily - 7 x weekly - 2 sets - 10 reps - Deep Squat with Pelvic Floor Relaxation  - 1 x daily - 7 x weekly - 2 sets - 10 reps - Sidelying Reverse Clamshell  - 1 x daily - 7 x weekly - 2 sets - 10 reps  ASSESSMENT:  CLINICAL IMPRESSION: Pt reports that she is wearing SI belt during the day at work and is using postural modifications and using position changes to manage pain.  PT reports minimal compliance with exercises due to time constraints.  PT emphasized importance of compliance for symptom management.   No significant change in her overall pain. She does well with all exercise in the clinic and PT was present to provide verbal and tactile cueing for technique. She is more challenged with clamshell on the Lt today. She will continue to benefit from skilled PT intervention in order to address deficits and improve pelvic/low back pain.    Eval: Patient is a 28 y.o. female who was seen today for physical therapy evaluation and treatment for LBP. Pt is pregnant with twins and due date is 01/22/24.  Pain began ~3 months ago and pt rates low back pain as 5-10/10 and describes it as stabbing and burning and this extends down the legs at times.  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  She also has a 62 year old child that she  cares for.  Pt with tension and trigger points in lumbar paraspinals and gluteals Rt>Lt.  Standing is limited to 1.5 hours at work and they are allowing her to sit for short periods.  Patient will benefit from skilled PT to address the below impairments and improve overall function.   OBJECTIVE IMPAIRMENTS: decreased activity tolerance, decreased endurance, difficulty walking, decreased ROM, hypomobility, increased muscle spasms, impaired flexibility, improper body mechanics, postural dysfunction, and pain.   ACTIVITY LIMITATIONS: carrying, lifting, standing, stairs, locomotion level, and caring for others  PARTICIPATION LIMITATIONS: meal prep, cleaning, laundry, shopping, community activity, and occupation  PERSONAL FACTORS: 1 comorbidity: pt is pregnant with twins are also affecting patient's functional outcome.   REHAB POTENTIAL: Good  CLINICAL DECISION MAKING: Stable/uncomplicated  EVALUATION COMPLEXITY: Low   GOALS: Goals reviewed with patient? Yes  SHORT TERM GOALS: Target date: 10/15/2024  Be independent in initial HEP Baseline: moderate compliance (10/07/24) Goal status: in progress   2.  Demonstrate and verbalize body mechanics modifications for lumbar protection with daily tasks  Baseline: she is making modifications at work and home (10/07/24) Goal status: MET  3.  Report < or = to 7/10 LBP with standing for work to improve tolerance  Baseline: 10/10 Goal status: INITIAL    LONG TERM GOALS: Target date: 11/12/2024    Be independent in advanced HEP  Baseline:  Goal status: INITIAL  2.  Improve Modified Oswestry to < or = to 10/50 (20% disability) to improve function at work and with care of her child Baseline: 16/50 Goal status: INITIAL  3.  Report > or = to 70% reduction in LBP with standing at work Baseline: 10/10 Goal status: INITIAL  4.  Stand for 2-2.5 hours at work before LBP limits this  Baseline: 1.5 hours max Goal status:  INITIAL   PLAN:  PT FREQUENCY: 2x/week  PT DURATION: 8 weeks  PLANNED INTERVENTIONS: 97110-Therapeutic exercises, 97530- Therapeutic activity, 97112- Neuromuscular re-education, 97535- Self Care, 02859- Manual therapy, [phone]- Canalith repositioning, [id number]- Aquatic Therapy, H9716- Electrical stimulation (unattended), [phone]- Electrical stimulation (manual), 20560 (1-2 muscles), 20561 (3+ muscles)- Dry Needling, Patient/Family education, Balance training, Taping, Joint mobilization, Vestibular training, Cryotherapy, Moist heat, and Biofeedback.  PLAN FOR NEXT SESSION: core/glute strength, flexibility,  tissue mobility (addaday or other manual work)  Burnard Joy, PT 10/07/24 9:26 AM   [facility] [address], [address] [place], [place] [address] Phone # [phone] Fax [phone]

## 2024-10-13 ENCOUNTER — Encounter (HOSPITAL_COMMUNITY): Payer: Self-pay | Admitting: Obstetrics and Gynecology

## 2024-10-13 ENCOUNTER — Inpatient Hospital Stay (HOSPITAL_COMMUNITY)
Admission: AD | Admit: 2024-10-13 | Discharge: 2024-10-13 | Disposition: A | Discharge status: Home / Self Care | Attending: Obstetrics and Gynecology | Admitting: Obstetrics and Gynecology

## 2024-10-13 DIAGNOSIS — O23592 Infection of other part of genital tract in pregnancy, second trimester: Secondary | ICD-10-CM | POA: Diagnosis not present

## 2024-10-13 DIAGNOSIS — B9689 Other specified bacterial agents as the cause of diseases classified elsewhere: Secondary | ICD-10-CM | POA: Diagnosis not present

## 2024-10-13 DIAGNOSIS — N760 Acute vaginitis: Secondary | ICD-10-CM | POA: Diagnosis not present

## 2024-10-13 DIAGNOSIS — Z3A25 25 weeks gestation of pregnancy: Secondary | ICD-10-CM

## 2024-10-13 DIAGNOSIS — O26892 Other specified pregnancy related conditions, second trimester: Secondary | ICD-10-CM | POA: Diagnosis not present

## 2024-10-13 DIAGNOSIS — O30042 Twin pregnancy, dichorionic/diamniotic, second trimester: Secondary | ICD-10-CM

## 2024-10-13 DIAGNOSIS — R109 Unspecified abdominal pain: Secondary | ICD-10-CM | POA: Diagnosis not present

## 2024-10-13 DIAGNOSIS — M549 Dorsalgia, unspecified: Secondary | ICD-10-CM | POA: Insufficient documentation

## 2024-10-13 DIAGNOSIS — Z8619 Personal history of other infectious and parasitic diseases: Secondary | ICD-10-CM | POA: Diagnosis not present

## 2024-10-13 DIAGNOSIS — B3731 Acute candidiasis of vulva and vagina: Secondary | ICD-10-CM | POA: Diagnosis not present

## 2024-10-13 LAB — URINALYSIS, ROUTINE W REFLEX MICROSCOPIC
Bilirubin Urine: NEGATIVE
Glucose, UA: NEGATIVE mg/dL
Hgb urine dipstick: NEGATIVE
Ketones, ur: NEGATIVE mg/dL
Nitrite: NEGATIVE
Protein, ur: 30 mg/dL — AB
Specific Gravity, Urine: 1.027 (ref 1.005–1.030)
pH: 5 (ref 5.0–8.0)

## 2024-10-13 LAB — WET PREP, GENITAL
Sperm: NONE SEEN
Trich, Wet Prep: NONE SEEN
WBC, Wet Prep HPF POC: <10 (ref <10)

## 2024-10-13 MED ORDER — METRONIDAZOLE 0.75 % VA GEL
1.0000 | Freq: Every day | VAGINAL | 0 refills | Status: AC
Start: 2024-10-13 — End: 2024-10-18

## 2024-10-13 MED ORDER — FLUCONAZOLE 150 MG PO TABS: 150.0000 mg | ORAL_TABLET | Freq: Once | ORAL | 0 refills | Status: AC

## 2024-10-13 MED ORDER — MICONAZOLE NITRATE 2 % VA CREA: 1.0000 | TOPICAL_CREAM | Freq: Every day | VAGINAL | 0 refills | Status: AC

## 2024-10-13 NOTE — MAU Note (Signed)
 Bedside u/s started at 2005 by Jomarie, MD to assist RN in finding fetal heart tones. On u/s babies are moving frequently and are right bedside eachother. MD and RN visualized fetal movement. RN palpated fetal movement while applying the FHR monitors. Bedside u/s completed @2016 .  Difficulty tracing FHR consistently due to frequent fetal movements and gestational age.

## 2024-10-13 NOTE — MAU Provider Note (Signed)
 History     [id number]  Arrival date and time: 10/13/24 1806    Chief Complaint  Patient presents with   Back Pain     HPI Darlene Ortiz is a 28 y.o. at [redacted]w[redacted]d by 11 wk US  with PMHx notable for PP HTN, syphilis (s/p treatment), twin gestation, who presents for back pain.   Patient well known to me from [facility] clinic Last seen 09/26/2024, at that time planning to go on ADA leave due to horrible back pain, FMLA paperwork had been submitted Has been going to her PT  Today reports she has been having a sharp R sided pain since this morning Mostly in the lower abdomen She has also been having her ongoing back pain, has not been taking the flexeril I sent her Pain was constant earlier but then it went away, it had lasted for about 5 hours and went away around 1630 Was also feeling nauseous during that time but not anymore Does not have the pain right now, but is confident it would come back if she moved in certain ways Having some vaginal discharge with odor, wondering if she has BV No burning or pain with urination  Fetal movement is vigorous for both No contractions No bleeding or leaking fluid   A/Positive/-- (08/06 1632)  OB History     Gravida  3   Para  1   Term  1   Preterm      AB  1   Living  1      SAB      IAB  1   Ectopic      Multiple  0   Live Births  1           Past Medical History:  Diagnosis Date   Syphilis 11/2022    Past Surgical History:  Procedure Laterality Date   TOOTH EXTRACTION      Family History  Problem Relation Age of Onset   Diabetes Mother    Breast cancer Maternal Grandmother     Social History   Socioeconomic History   Marital status: Single    Spouse name: Not on file   Number of children: Not on file   Years of education: Not on file   Highest education level: Not on file  Occupational History   Not on file  Tobacco Use   Smoking status: Never   Smokeless tobacco: Never  Vaping Use   Vaping  status: Never Used  Substance and Sexual Activity   Alcohol use: Not Currently    Comment: occassionally   Drug use: Not Currently    Types: Marijuana   Sexual activity: Yes    Birth control/protection: None    Comment: none  Other Topics Concern   Not on file  Social History Narrative   Not on file   Social Drivers of Health   Financial Resource Strain: Not on file  Food Insecurity: No Food Insecurity (09/27/2021)   Hunger Vital Sign    Worried About Running Out of Food in the Last Year: Never true    Ran Out of Food in the Last Year: Never true  Transportation Needs: No Transportation Needs (09/27/2021)   PRAPARE - Administrator, Civil Service (Medical): No    Lack of Transportation (Non-Medical): No  Physical Activity: Not on file  Stress: Not on file  Social Connections: Not on file  Intimate Partner Violence: Not on file    No Known Allergies  No  current facility-administered medications on file prior to encounter.   Current Outpatient Medications on File Prior to Encounter  Medication Sig Dispense Refill   acetaminophen  (TYLENOL ) 325 MG tablet Take 650 mg by mouth every 6 (six) hours as needed.     aspirin  EC 81 MG tablet Take 2 tablets (162 mg total) by mouth daily. Swallow whole. 60 tablet 12   Blood Pressure Monitoring (BLOOD PRESSURE KIT) DEVI 1 Device by Does not apply route as needed. (Patient not taking: Reported on 09/26/2024) 1 each 0   cetirizine  (ZYRTEC  ALLERGY) 10 MG tablet Take 1 tablet (10 mg total) by mouth daily. (Patient not taking: Reported on 09/26/2024) 30 tablet 0   cyclobenzaprine (FLEXERIL) 10 MG tablet Take 1 tablet (10 mg total) by mouth every 8 (eight) hours as needed for muscle spasms. 30 tablet 1   Drospirenone  (SLYND ) 4 MG TABS Take 1 tablet (4 mg total) by mouth daily. (Patient not taking: Reported on 09/26/2024) 84 tablet 4   Prenatal Vit-Fe Fumarate-FA (PRENATAL VITAMINS) 28-0.8 MG TABS Take 1 tablet by mouth daily at 6  (six) AM. 90 tablet 3     ROS Pertinent positives and negative per HPI, all others reviewed and negative  Physical Exam   BP 117/74 (BP Location: Right Arm)   Pulse 92   Temp 98.7 F (37.1 C) (Oral)   Resp 19   Ht 5' 7 (1.702 m)   Wt 86.5 kg   LMP 04/23/2024 (Exact Date)   SpO2 100%   BMI 29.87 kg/m   Patient Vitals for the past 24 hrs:  BP Temp Temp src Pulse Resp SpO2 Height Weight  10/13/24 1855 117/74 98.7 F (37.1 C) Oral 92 19 100 % -- --  10/13/24 1838 -- -- -- -- -- -- 5' 7 (1.702 m) 86.5 kg    Physical Exam Vitals reviewed.  Constitutional:      General: She is not in acute distress.    Appearance: She is well-developed. She is not diaphoretic.  Eyes:     General: No scleral icterus. Pulmonary:     Effort: Pulmonary effort is normal. No respiratory distress.  Abdominal:     General: There is no distension.     Palpations: Abdomen is soft.     Tenderness: There is no abdominal tenderness. There is no guarding or rebound.  Skin:    General: Skin is warm and dry.  Neurological:     Mental Status: She is alert.     Coordination: Coordination normal.      Cervical Exam Dilation: Closed Effacement (%): Thick Exam by:: Dr. Lola  Bedside Ultrasound Not performed.  My interpretation: n/a  FHT Baby A Baseline: 140 bpm Variability: Good {> 6 bpm) Accelerations: Reactive Decelerations: Absent Uterine activity: irritability Cat: I  Baby B Baseline: 135 bpm Variability: Good {> 6 bpm) Accelerations: Reactive Decelerations: Absent Uterine activity: irritability Cat: I  Labs Results for orders placed or performed during the hospital encounter of 10/13/24 (from the past 24 hours)  Urinalysis, Routine w reflex microscopic -Urine, Clean Catch     Status: Abnormal   Collection Time: 10/13/24  7:02 PM  Result Value Ref Range   Color, Urine YELLOW YELLOW   APPearance HAZY (A) CLEAR   Specific Gravity, Urine 1.027 1.005 - 1.030   pH 5.0 5.0 -  8.0   Glucose, UA NEGATIVE NEGATIVE mg/dL   Hgb urine dipstick NEGATIVE NEGATIVE   Bilirubin Urine NEGATIVE NEGATIVE   Ketones, ur NEGATIVE NEGATIVE mg/dL  Protein, ur 30 (A) NEGATIVE mg/dL   Nitrite NEGATIVE NEGATIVE   Leukocytes,Ua MODERATE (A) NEGATIVE   RBC / HPF 0-5 0 - 5 RBC/hpf   WBC, UA 0-5 0 - 5 WBC/hpf   Bacteria, UA RARE (A) NONE SEEN   Squamous Epithelial / HPF 11-20 0 - 5 /HPF   Mucus PRESENT   Wet prep, genital     Status: Abnormal   Collection Time: 10/13/24  7:50 PM  Result Value Ref Range   Yeast Wet Prep HPF POC PRESENT (A) NONE SEEN   Trich, Wet Prep NONE SEEN NONE SEEN   Clue Cells Wet Prep HPF POC PRESENT (A) NONE SEEN   WBC, Wet Prep HPF POC <10 <10   Sperm NONE SEEN     Imaging No results found.  MAU Course  Procedures Lab Orders         Wet prep, genital         Urinalysis, Routine w reflex microscopic -Urine, Clean Catch    Meds ordered this encounter  Medications   fluconazole  (DIFLUCAN ) 150 MG tablet    Sig: Take 1 tablet (150 mg total) by mouth once for 1 dose.    Dispense:  1 tablet    Refill:  0   miconazole  (MONISTAT  7) 2 % vaginal cream    Sig: Place 1 Applicatorful vaginally at bedtime for 7 days.    Dispense:  45 g    Refill:  0   metroNIDAZOLE  (METROGEL ) 0.75 % vaginal gel    Sig: Place 1 Applicatorful vaginally at bedtime for 5 days.    Dispense:  70 g    Refill:  0    Please discontinue prior RX for Metronidazole  tablets.   Imaging Orders  No imaging studies ordered today    MDM Moderate (Level 3-4)  Assessment and Plan  #Abdominal pain in pregnancy, second trimester #Di/di twin gestation #BV #Yeast #[redacted] weeks gestation of pregnancy Cervix closed on exam, suspect pain is due to advancing gestational age. Have discussed trial of flexeril with her in the past, she has rx already, also has maternity belt at home. Incidental finding of yeast/BV. Recommended diflucan  and metrogel , she is hesitant to take diflucan  and would  like miconazole , all of the above sent to her pharmacy.   #FWB Discontinuous tracing but overall reassuring for gestational age 41 Cat I NST: Reactive   Dispo: discharged to home in stable condition    Donnice CHRISTELLA Carolus, MD/MPH 10/13/24 8:45 PM  Allergies as of 10/13/2024   No Known Allergies      Medication List     TAKE these medications    acetaminophen  325 MG tablet Commonly known as: TYLENOL  Take 650 mg by mouth every 6 (six) hours as needed.   aspirin  EC 81 MG tablet Take 2 tablets (162 mg total) by mouth daily. Swallow whole.   Blood Pressure Kit Devi 1 Device by Does not apply route as needed.   cetirizine  10 MG tablet Commonly known as: ZyrTEC  Allergy Take 1 tablet (10 mg total) by mouth daily.   cyclobenzaprine 10 MG tablet Commonly known as: FLEXERIL Take 1 tablet (10 mg total) by mouth every 8 (eight) hours as needed for muscle spasms.   fluconazole  150 MG tablet Commonly known as: DIFLUCAN  Take 1 tablet (150 mg total) by mouth once for 1 dose.   metroNIDAZOLE  0.75 % vaginal gel Commonly known as: METROGEL  Place 1 Applicatorful vaginally at bedtime for 5 days.   miconazole  2 %  vaginal cream Commonly known as: MONISTAT  7 Place 1 Applicatorful vaginally at bedtime for 7 days.   Prenatal Vitamins 28-0.8 MG Tabs Take 1 tablet by mouth daily at 6 (six) AM.   Slynd  4 MG Tabs Generic drug: Drospirenone  Take 1 tablet (4 mg total) by mouth daily.

## 2024-10-13 NOTE — MAU Note (Signed)
 MAU Triage Note  Darlene Ortiz is a 28 y.o. at [redacted]w[redacted]d here in MAU reporting: reports around 11:30am/12pm she noticed a sharp pain on the right side of her abd while she was at work.So she came here to be seen. Reports it's come and go a few times since then. Also reports back pain, Reports she's currently in PT but it isn't really helping.  Pt also reports thick yellow discharge that has a mild odor. Denies VB, endorses +FM.    Onset of complaint: 11:30am/12pm Pain score: 9/10 Vitals:   10/13/24 1855  BP: 117/74  Pulse: 92  Resp: 19  Temp: 98.7 F (37.1 C)  SpO2: 100%     FHT: 138   Lab orders placed from triage: UA

## 2024-10-13 NOTE — Discharge Instructions (Signed)
 You were seen in MAU for abdominal pain. This is likely due to your pregnancy getting bigger. We checked your cervix and it was closed, so you are not in labor.   We also found you have yeast and BV infections. I sent medicine for both to your pharmacy.

## 2024-10-14 ENCOUNTER — Ambulatory Visit

## 2024-10-14 DIAGNOSIS — M5459 Other low back pain: Secondary | ICD-10-CM | POA: Diagnosis not present

## 2024-10-14 DIAGNOSIS — R252 Cramp and spasm: Secondary | ICD-10-CM

## 2024-10-14 DIAGNOSIS — M6281 Muscle weakness (generalized): Secondary | ICD-10-CM

## 2024-10-14 LAB — GC/CHLAMYDIA PROBE AMP (~~LOC~~) NOT AT ARMC
Chlamydia: NEGATIVE
Comment: NEGATIVE
Comment: NORMAL
Neisseria Gonorrhea: NEGATIVE

## 2024-10-14 NOTE — Therapy (Signed)
 OUTPATIENT PHYSICAL THERAPY THORACOLUMBAR TREATMENT   Patient Name: Darlene Ortiz MRN: [id number] DOB:[birth year], 28 y.o., female Today's Date: 10/14/2024  END OF SESSION:  PT End of Session - 10/14/24 0847     Visit Number 5    Date for Recertification  11/12/24    Authorization Type [employer]    Authorization Time Period 09/17/24-11/15/24; dn form signed 09/30/24    Authorization - Visit Number 4    Authorization - Number of Visits 6    PT Start Time 0811    PT Stop Time 0846    PT Time Calculation (min) 35 min    Activity Tolerance Patient limited by pain    Behavior During Therapy [facility] for tasks assessed/performed              Past Medical History:  Diagnosis Date   Syphilis 11/2022   Past Surgical History:  Procedure Laterality Date   TOOTH EXTRACTION     Patient Active Problem List   Diagnosis Date Noted   Twin pregnancy, twins dichorionic and diamniotic 08/27/2024   Supervision of high risk pregnancy, antepartum, second trimester 07/02/2024   History of postpartum hypertension 07/02/2024   History of maternal syphilis, currently pregnant 07/02/2024   Secondary syphilis, relapse (treated) 10/02/2023   ASCUS with positive high risk HPV cervical 12/22/2022      REFERRING PROVIDER: Eldonna Iha MD  REFERRING DIAG:  Diagnosis  O99.891,M54.9 (ICD-10-CM) - Back pain affecting pregnancy in second trimester    Rationale for Evaluation and Treatment: Rehabilitation  THERAPY DIAG:  Other low back pain  Muscle weakness (generalized)  Cramp and spasm  ONSET DATE: 06/2024  SUBJECTIVE:                                                                                                                                                                                           SUBJECTIVE STATEMENT: I went to the maternity ED yesterday after work.  I was having sharp pains in my low pelvis.  I was feeling nauseated.  Everything checked out and babies are ok.   I am not sure how much longer I will be able to work.     Eval: Pt presents to PT with LBP.  She is pregnant with twins and due 01/22/24.  She reports stabbing pain on the Lt side of the low back that radiates to the Lt LE.   PERTINENT HISTORY:  Pt is pregnant due 01/22/24  PAIN: 10/14/24 Are you having pain? Yes: NPRS scale: 7/10 Pain location: Bil low back and down legs Pain description: stabbing, ache sore  Aggravating factors: standing, holding child, carrying groceries  Relieving factors: relaxation, stretching, heat  PRECAUTIONS: Other: pt is pregnant with twins  RED FLAGS: None   WEIGHT BEARING RESTRICTIONS: No  FALLS:  Has patient fallen in last 6 months? 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  DIAGNOSTIC FINDINGS:  None   PATIENT SURVEYS:  09/16/24: Modified Oswestry: 16/50=32% disability   COGNITION: Overall cognitive status: Within functional limits for tasks assessed     SENSATION: WFL  MUSCLE LENGTH: WFLs  POSTURE: rounded shoulders, forward head, and anterior pelvic tilt  PALPATION: Trigger points and pain over bil lumbar paraspinals and gluteals (Rt>Lt)  LUMBAR ROM:   Full lumbar A/ROM   LOWER EXTREMITY MMT:    4+/5 hip and LE strength, 4/5 core strength     GAIT: Distance walked: 50 feet  Assistive device utilized: None Level of assistance: Complete Independence Comments: WFLs  TREATMENT DATE:  10/14/24 Manual: Kinesiotape: 2 I strips over lumbar paraspinals and X strips over SI joints   Therapeutic exercise:  NuStep: level 3x 6 min-PT present to discuss progress Supine: hip flexor stretch on Lt x30 seconds Open book x5 each Hip IR/ER- could only go to the Lt   10/07/24 Manual: With Addaday  Bil sidelying gluteals with PT monitoring for response Neuromuscular re-education: Clam and reverse clam with TA activation x10 bil each Therapeutic exercise: Standing hamstring stretch 3x20 seconds Ball roll outs 3 ways x 8 reps NuStep: level 3x 6 min-PT present to discuss progress   09/30/24 Manual: Bil sidelying gluteal trigger point release and soft tissue mobilization to lumbar paraspinals  Neuromuscular re-education: Transversus abdominus training with multimodal cues for improved motor control and breath coordination 10x Posterior pelvic tilts in standing again wall - flattening back against wall - exhale contract transversus abdominus to posteriorly tilt 2 x 10 Therapeutic activities: Pt education: Pelvic stability belt Standing positions with more support- propping one foot on stool Self massage in standing using tennis ball to bil glutes Practiced bed mobility with use of breathing and transversus abdominus to help reduce pain and improve ease   09/24/24 Standing L at barre Seated HS stretch with leg on mat table 2x30 sec Seated fig 4 2 x 30 sec Seated cat/cow Cat camel to child's pose Childs pose stretch 3 way Open book x 10 B with knee on foam SL hip IR with knee on foam x 10 B SL hip ER x 10 B Hooklying with green band pull downs 2x10 IASTM x 4 min with JIIjijb        PATIENT EDUCATION:  Education details: [email or web address], postural changes at work Person educated: Patient Education method: Programmer, Multimedia, Facilities Manager, and Handouts Education comprehension: verbalized understanding and returned demonstration  HOME EXERCISE PROGRAM: Access Code: [email or web address] URL: [email or web address] Date: 09/24/2024 Prepared by: Mliss  Exercises -  Cat-Camel to Child's Pose  - 2 x daily - 7 x weekly - 1 sets - 10 reps - 5 hold - Child's Pose Stretch  - 1 x daily - 7 x weekly - 1 sets - 3 reps - 20 hold - Child's Pose with Sidebending  - 2 x daily - 7 x  weekly - 10 reps - 20 hold - Standing 'L' Stretch at [employer]  - 3 x daily - 7 x weekly - 1 sets - 5 reps - 20 hold - Seated Hamstring Stretch  - 3 x daily - 7 x weekly - 1 sets - 3 reps - 20 hold - Seated Piriformis Stretch  - 3 x daily - 7 x weekly - 1 sets - 3 reps - 20 hold - Sidelying Open Book Thoracic Rotation with Knee on Foam Roll  - 1 x daily - 7 x weekly - 2 sets - 10 reps - Deep Squat with Pelvic Floor Relaxation  - 1 x daily - 7 x weekly - 2 sets - 10 reps - Sidelying Reverse Clamshell  - 1 x daily - 7 x weekly - 2 sets - 10 reps  ASSESSMENT:  CLINICAL IMPRESSION: PT arrived with increased pain and had to go to ED yesterday due to pain in lower pelvis at work.  She was experiencing 9/10 pain during her work shift.  Today, she was able to participate in gentle mobility exercises with some modifications.  Trial of kinesiotape for low back support and pt will wear Belly Band as needed. Demo of Belly Band in the clinic and she is wearing hers appropriately.  Discussed activity modifications for work to reduce lumbar stress.   She will continue to benefit from skilled PT intervention in order to address deficits and improve pelvic/low back pain.    Eval: Patient is a 28 y.o. female who was seen today for physical therapy evaluation and treatment for LBP. Pt is pregnant with twins and due date is 01/22/24.  Pain began ~3 months ago and pt rates low back pain as 5-10/10 and describes it as stabbing and burning and this extends down the legs at times.  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  She also has a 27 year old child that she cares for.  Pt with tension and trigger points in lumbar paraspinals and gluteals Rt>Lt.  Standing is limited to 1.5  hours at work and they are allowing her to sit for short periods.  Patient will benefit from skilled PT to address the below impairments and improve overall function.   OBJECTIVE IMPAIRMENTS: decreased activity tolerance, decreased endurance, difficulty walking, decreased ROM, hypomobility, increased muscle spasms, impaired flexibility, improper body mechanics, postural dysfunction, and pain.   ACTIVITY LIMITATIONS: carrying, lifting, standing, stairs, locomotion level, and caring for others  PARTICIPATION LIMITATIONS: meal prep, cleaning, laundry, shopping, community activity, and occupation  PERSONAL FACTORS: 1 comorbidity: pt is pregnant with twins are also affecting patient's functional outcome.   REHAB POTENTIAL: Good  CLINICAL DECISION MAKING: Stable/uncomplicated  EVALUATION COMPLEXITY: Low   GOALS: Goals reviewed with patient? Yes  SHORT TERM GOALS: Target date: 10/15/2024    Be independent in initial HEP Baseline: moderate compliance (10/07/24) Goal status: in progress   2.  Demonstrate and verbalize body mechanics modifications for lumbar protection with daily tasks  Baseline: she is making modifications at work and home (10/07/24) Goal status: MET  3.  Report < or = to 7/10 LBP with standing for work to improve tolerance  Baseline: 10/10 Goal status: INITIAL    LONG TERM GOALS: Target date: 11/12/2024    Be independent in advanced HEP  Baseline:  Goal status: INITIAL  2.  Improve Modified Oswestry to < or = to 10/50 (20% disability) to improve function at work and  with care of her child Baseline: 16/50 Goal status: INITIAL  3.  Report > or = to 70% reduction in LBP with standing at work Baseline: 10/10 Goal status: INITIAL  4.  Stand for 2-2.5 hours at work before LBP limits this  Baseline: 1.5 hours max Goal status: INITIAL   PLAN:  PT FREQUENCY: 2x/week  PT DURATION: 8 weeks  PLANNED INTERVENTIONS: 97110-Therapeutic exercises, 97530-  Therapeutic activity, 97112- Neuromuscular re-education, 97535- Self Care, 02859- Manual therapy, [phone]- Canalith repositioning, [id number]- Aquatic Therapy, H9716- Electrical stimulation (unattended), [phone]- Electrical stimulation (manual), 20560 (1-2 muscles), 20561 (3+ muscles)- Dry Needling, Patient/Family education, Balance training, Taping, Joint mobilization, Vestibular training, Cryotherapy, Moist heat, and Biofeedback.  PLAN FOR NEXT SESSION: core/glute strength, flexibility,  tissue mobility (addaday or other manual work)  Burnard Joy, PT 10/14/24 9:26 AM   [facility] [address] [place], [place] [address] Phone # [phone] Fax [phone]

## 2024-10-17 ENCOUNTER — Ambulatory Visit

## 2024-10-17 ENCOUNTER — Other Ambulatory Visit

## 2024-10-20 ENCOUNTER — Ambulatory Visit

## 2024-10-23 DIAGNOSIS — Z0289 Encounter for other administrative examinations: Secondary | ICD-10-CM

## 2024-10-27 ENCOUNTER — Other Ambulatory Visit: Payer: Self-pay

## 2024-10-27 ENCOUNTER — Ambulatory Visit

## 2024-10-27 DIAGNOSIS — Z3A27 27 weeks gestation of pregnancy: Secondary | ICD-10-CM

## 2024-10-28 ENCOUNTER — Ambulatory Visit

## 2024-10-28 ENCOUNTER — Encounter: Payer: Self-pay | Admitting: Family Medicine

## 2024-10-28 ENCOUNTER — Other Ambulatory Visit (HOSPITAL_COMMUNITY)
Admission: RE | Admit: 2024-10-28 | Discharge: 2024-10-28 | Disposition: A | Discharge status: Home / Self Care | Source: Ambulatory Visit | Attending: Family Medicine | Admitting: Family Medicine

## 2024-10-28 ENCOUNTER — Ambulatory Visit: Attending: Obstetrics and Gynecology | Admitting: Maternal & Fetal Medicine

## 2024-10-28 ENCOUNTER — Other Ambulatory Visit: Payer: Self-pay | Admitting: Family Medicine

## 2024-10-28 ENCOUNTER — Other Ambulatory Visit: Payer: Self-pay | Admitting: *Deleted

## 2024-10-28 ENCOUNTER — Other Ambulatory Visit: Payer: Self-pay

## 2024-10-28 ENCOUNTER — Other Ambulatory Visit

## 2024-10-28 ENCOUNTER — Ambulatory Visit: Admitting: Family Medicine

## 2024-10-28 VITALS — BP 122/73 | HR 111 | Wt 192.0 lb

## 2024-10-28 VITALS — BP 119/67

## 2024-10-28 DIAGNOSIS — Z3A27 27 weeks gestation of pregnancy: Secondary | ICD-10-CM

## 2024-10-28 DIAGNOSIS — N898 Other specified noninflammatory disorders of vagina: Secondary | ICD-10-CM | POA: Insufficient documentation

## 2024-10-28 DIAGNOSIS — O358XX2 Maternal care for other (suspected) fetal abnormality and damage, fetus 2: Secondary | ICD-10-CM

## 2024-10-28 DIAGNOSIS — O358XX1 Maternal care for other (suspected) fetal abnormality and damage, fetus 1: Secondary | ICD-10-CM

## 2024-10-28 DIAGNOSIS — Z8679 Personal history of other diseases of the circulatory system: Secondary | ICD-10-CM

## 2024-10-28 DIAGNOSIS — O26899 Other specified pregnancy related conditions, unspecified trimester: Secondary | ICD-10-CM | POA: Diagnosis present

## 2024-10-28 DIAGNOSIS — O365921 Maternal care for other known or suspected poor fetal growth, second trimester, fetus 1: Secondary | ICD-10-CM

## 2024-10-28 DIAGNOSIS — O30043 Twin pregnancy, dichorionic/diamniotic, third trimester: Secondary | ICD-10-CM

## 2024-10-28 DIAGNOSIS — O30042 Twin pregnancy, dichorionic/diamniotic, second trimester: Secondary | ICD-10-CM

## 2024-10-28 DIAGNOSIS — O0992 Supervision of high risk pregnancy, unspecified, second trimester: Secondary | ICD-10-CM

## 2024-10-28 DIAGNOSIS — O365920 Maternal care for other known or suspected poor fetal growth, second trimester, not applicable or unspecified: Secondary | ICD-10-CM | POA: Diagnosis not present

## 2024-10-28 DIAGNOSIS — O09292 Supervision of pregnancy with other poor reproductive or obstetric history, second trimester: Secondary | ICD-10-CM | POA: Diagnosis not present

## 2024-10-28 DIAGNOSIS — Z364 Encounter for antenatal screening for fetal growth retardation: Secondary | ICD-10-CM | POA: Diagnosis not present

## 2024-10-28 DIAGNOSIS — O365922 Maternal care for other known or suspected poor fetal growth, second trimester, fetus 2: Secondary | ICD-10-CM

## 2024-10-28 DIAGNOSIS — Z8759 Personal history of other complications of pregnancy, childbirth and the puerperium: Secondary | ICD-10-CM

## 2024-10-28 DIAGNOSIS — O365990 Maternal care for other known or suspected poor fetal growth, unspecified trimester, not applicable or unspecified: Secondary | ICD-10-CM | POA: Insufficient documentation

## 2024-10-28 DIAGNOSIS — R87610 Atypical squamous cells of undetermined significance on cytologic smear of cervix (ASC-US): Secondary | ICD-10-CM

## 2024-10-28 DIAGNOSIS — A5149 Other secondary syphilitic conditions: Secondary | ICD-10-CM

## 2024-10-28 NOTE — Progress Notes (Signed)
   Subjective:  Darlene Ortiz is a 28 y.o. G3P1011 at [redacted]w[redacted]d being seen today for ongoing prenatal care.  She is currently monitored for the following issues for this high-risk pregnancy and has ASCUS with positive high risk HPV cervical; Secondary syphilis, relapse (treated); Supervision of high risk pregnancy, antepartum, second trimester; History of postpartum hypertension; and Twin pregnancy, twins dichorionic and diamniotic on their problem list.  Patient reports no complaints.  Contractions: Not present. Vag. Bleeding: None.  Movement: Present. Denies leaking of fluid.   The following portions of the patient's history were reviewed and updated as appropriate: allergies, current medications, past family history, past medical history, past social history, past surgical history and problem list. Problem list updated.  Objective:   Vitals:   10/28/24 0929  BP: 122/73  Pulse: (!) 111  Weight: 192 lb (87.1 kg)    Fetal Status: Fetal Heart Rate (bpm): 153/147   Movement: Present     General:  Alert, oriented and cooperative. Patient is in no acute distress.  Skin: Skin is warm and dry. No rash noted.   Cardiovascular: Normal heart rate noted  Respiratory: Normal respiratory effort, no problems with respiration noted  Abdomen: Soft, gravid, appropriate for gestational age. Pain/Pressure: Absent     Pelvic: Vag. Bleeding: None Vag D/C Character: White   Cervical exam deferred        Extremities: Normal range of motion.  Edema: None  Mental Status: Normal mood and affect. Normal behavior. Normal judgment and thought content.   Urinalysis:      Assessment and Plan:  Pregnancy: G3P1011 at [redacted]w[redacted]d  1. Supervision of high risk pregnancy, antepartum, second trimester (Primary) BP and FHR normal Has anatomy scan scheduled for this afternoon Third trimester labs today TDAP given today Persistent vaginitis symptoms, has taken diflucan  and metrogel , repeat self swab today  2. Dichorionic  diamniotic twin pregnancy in third trimester Anatomy scan later today On ASA 162 mg  3. Secondary syphilis, relapse (treated) Stable titer 1:2 c/w adequate treatment  4. History of postpartum hypertension On ASA  5. ASCUS with positive high risk HPV cervical Plan for LEEP PP  Preterm labor symptoms and general obstetric precautions including but not limited to vaginal bleeding, contractions, leaking of fluid and fetal movement were reviewed in detail with the patient. Please refer to After Visit Summary for other counseling recommendations.  Return in 2 weeks (on 11/11/2024) for Dyad patient, ob visit.   Lola Donnice HERO, MD

## 2024-10-28 NOTE — Progress Notes (Signed)
  Diamniotic Dichorionic pregnancy at an advanced gestational age of 67w 6d   A detailed examination for DiDi twin pregnancyw as performed with noteable FGR for both twins. Normal anatomy with good amniotic fluid and fetal movement was observed in Twin A and B. Suboptimal views of the fetal anatomy were obtained secondary to multiple gestation and advance gestational age.  Twin discordance of 4% However we observed FGR in Twin A of 3.2% and Twin B of 6%. The UAD was normal in Twin A and borderline elevated in Twin B at the 95th%. There was no evidence of AEDF or REDF. NST reactive for twin A and B.  I reviewed the normal nature of today's ultrasound with Ms. Flury she conveyed that she is not taking low dose aspirin  for preeclampsia prevention, as she was further along in gestation at the time of her prenatal care.  She had a low risk NIPS and negative horizon.   We reviewed the sonographic findings and limitations of ultrasound. The potential risks associated with a twin gestation were discussed.  This discussion included a review of the increased risk of miscarriages, anomalies, preterm labor, and/or delivery, malpresentation, delivery via cesarean section, gestational diabetes, and/or preeclampsia.  With regards to fetal risks, there is an increased risk for fetal growth restriction of one or both twins, preterm labor, and associated morbidity, and intrauterine fetal demise.    We recommend growth scans every 4 weeks starting at 24 weeks with the initation of weekly antenatal testing in the form of twice weekly NST or weekly BPP should abnormal fetal growth or intertwin discordance of greater than 20-25% is noted.   Regarding her new diagnosis of IUGR. I explained that the etiology includes placental insufficiency, chronic disease, infection, aneuploidy and other genetic syndromes. She has a low risk NIPS. She has no additional risk factors for chronic disease. At this time I explained the  diagnosis, evaluation and management to include on seriagl growth every 3 weeks with and weekly antenatal testing to include NST/AFI/UA Dopplers until 32 weeks at which time we will add BPP's.  I recommend delivery between 36-37 weeks.   Following counseling, all questions were addressed.    I spent 45 minutes with > 50% in face to face consultation  Nathanel DOROTHA Fetters, MD

## 2024-10-28 NOTE — Addendum Note (Signed)
 Addended by: TRUDY SHORES on: 10/28/2024 10:26 AM   Modules accepted: Orders

## 2024-10-28 NOTE — Procedures (Signed)
 Chantal Worthey Mar 18, 1996 [redacted]w[redacted]d  Fetus A Non-Stress Test Interpretation for 10/28/24  Indication: Di/Di twin pregnancy  Fetal Heart Rate A Mode: External Baseline Rate (A): 140 bpm Variability: Moderate Accelerations: 10 x 10 Decelerations: None Multiple birth?: Yes  Uterine Activity Mode: Palpation Contraction Frequency (min): none noted Resting Tone Palpated: Relaxed  Interpretation (Fetal Testing) Nonstress Test Interpretation: Reactive Comments: Reviewed with Dr. Kizzie Beverlie Remington [birth year] [redacted]w[redacted]d   Fetus B Non-Stress Test Interpretation for 10/28/24  Indication: IUGR and Di/Di twin pregnancy  Fetal Heart Rate Fetus B Mode: External, Doppler Baseline Rate (B): 135 BPM Variability: Moderate Accelerations: 10 x 10 Decelerations: None  Uterine Activity Mode: Palpation Contraction Frequency (min): none noted Resting Tone Palpated: Relaxed  Interpretation (Baby B - Fetal Testing) Nonstress Test Interpretation (Baby B): Reactive Comments (Baby B): Reviewed with Dr. Kizzie

## 2024-10-28 NOTE — Progress Notes (Signed)
 [employer] accomodation form completed and signed by Dr. Lola.  Faxed to [employer], patient given copy while in office for her appointment.    [place], RN

## 2024-10-28 NOTE — Patient Instructions (Signed)

## 2024-10-29 ENCOUNTER — Ambulatory Visit: Payer: Self-pay | Admitting: Family Medicine

## 2024-10-29 LAB — RPR, QUANT+TP ABS (REFLEX)
Rapid Plasma Reagin, Quant: 1:2 {titer} — ABNORMAL HIGH
T Pallidum Abs: REACTIVE — AB

## 2024-10-29 LAB — CBC
Hematocrit: 28.2 % — ABNORMAL LOW (ref 34.0–46.6)
Hemoglobin: 8.5 g/dL — ABNORMAL LOW (ref 11.1–15.9)
MCH: 23.8 pg — ABNORMAL LOW (ref 26.6–33.0)
MCHC: 30.1 g/dL — ABNORMAL LOW (ref 31.5–35.7)
MCV: 79 fL (ref 79–97)
Platelets: 340 x10E3/uL (ref 150–450)
RBC: 3.57 x10E6/uL — ABNORMAL LOW (ref 3.77–5.28)
RDW: 13.5 % (ref 11.7–15.4)
WBC: 8.4 x10E3/uL (ref 3.4–10.8)

## 2024-10-29 LAB — GLUCOSE TOLERANCE, 2 HOURS W/ 1HR
Glucose, 1 hour: 114 mg/dL (ref 70–179)
Glucose, 2 hour: 102 mg/dL (ref 70–152)
Glucose, Fasting: 71 mg/dL (ref 70–91)

## 2024-10-29 LAB — CERVICOVAGINAL ANCILLARY ONLY
Bacterial Vaginitis (gardnerella): NEGATIVE
Candida Glabrata: NEGATIVE
Candida Vaginitis: POSITIVE — AB
Chlamydia: NEGATIVE
Comment: NEGATIVE
Comment: NEGATIVE
Comment: NEGATIVE
Comment: NEGATIVE
Comment: NEGATIVE
Comment: NORMAL
Neisseria Gonorrhea: NEGATIVE
Trichomonas: NEGATIVE

## 2024-10-29 LAB — HIV ANTIBODY (ROUTINE TESTING W REFLEX): HIV Screen 4th Generation wRfx: NONREACTIVE

## 2024-10-29 LAB — SYPHILIS: RPR W/REFLEX TO RPR TITER AND TREPONEMAL ANTIBODIES, TRADITIONAL SCREENING AND DIAGNOSIS ALGORITHM: RPR Ser Ql: REACTIVE — AB

## 2024-10-29 MED ORDER — FLUCONAZOLE 150 MG PO TABS: 150.0000 mg | ORAL_TABLET | Freq: Once | ORAL | 0 refills | Status: AC

## 2024-11-03 ENCOUNTER — Telehealth: Payer: Self-pay

## 2024-11-03 ENCOUNTER — Ambulatory Visit: Payer: Self-pay | Admitting: Family Medicine

## 2024-11-03 DIAGNOSIS — O0992 Supervision of high risk pregnancy, unspecified, second trimester: Secondary | ICD-10-CM

## 2024-11-03 NOTE — Telephone Encounter (Signed)
 Dr. Lola, patient will be scheduled as soon as possible.  Auth Submission: NO AUTH NEEDED Site of care: Site of care: [facility] Payer: [place] [employer] medicaid Medication & CPT/J Code(s) submitted: Venofer  (Iron  Sucrose) J1756 Diagnosis Code:  Route of submission (phone, fax, portal):  Phone # Fax # Auth type: Buy/Bill PB Units/visits requested: 200mg  x 5 doses Reference number:  Approval from: 11/03/24 to 12/03/24

## 2024-11-04 ENCOUNTER — Ambulatory Visit

## 2024-11-05 ENCOUNTER — Ambulatory Visit

## 2024-11-05 VITALS — BP 107/62 | HR 93 | Temp 98.8°F | Resp 18 | Ht 67.0 in | Wt 195.6 lb

## 2024-11-05 DIAGNOSIS — Z3A29 29 weeks gestation of pregnancy: Secondary | ICD-10-CM

## 2024-11-05 DIAGNOSIS — O99013 Anemia complicating pregnancy, third trimester: Secondary | ICD-10-CM

## 2024-11-05 MED ORDER — IRON SUCROSE 200 MG IVPB - SIMPLE MED
200.0000 mg | Freq: Once | Status: AC
  Administered 2024-11-05: 200 mg via INTRAVENOUS
  Filled 2024-11-05 (×2): qty 110

## 2024-11-05 NOTE — Progress Notes (Signed)
 Diagnosis: Acute Anemia  Provider:  Mannam, Praveen MD  Procedure: IV Infusion  IV Type: Peripheral, IV Location: L Antecubital  Venofer  (Iron  Sucrose), Dose: 200 mg  Infusion Start Time: 1532  Infusion Stop Time: 1552  Post Infusion IV Care: Patient declined observation and Peripheral IV Discontinued  Discharge: Condition: Good, Destination: Home . AVS Declined  Performed by:  Rocky FORBES Sar, RN

## 2024-11-06 ENCOUNTER — Encounter: Payer: Self-pay | Admitting: Obstetrics

## 2024-11-06 ENCOUNTER — Ambulatory Visit: Attending: Obstetrics and Gynecology

## 2024-11-06 ENCOUNTER — Other Ambulatory Visit: Payer: Self-pay

## 2024-11-06 ENCOUNTER — Ambulatory Visit

## 2024-11-06 ENCOUNTER — Other Ambulatory Visit

## 2024-11-06 ENCOUNTER — Other Ambulatory Visit: Payer: Self-pay | Admitting: Obstetrics

## 2024-11-06 VITALS — BP 122/64 | HR 102

## 2024-11-06 DIAGNOSIS — O30043 Twin pregnancy, dichorionic/diamniotic, third trimester: Secondary | ICD-10-CM

## 2024-11-06 DIAGNOSIS — Z3A29 29 weeks gestation of pregnancy: Secondary | ICD-10-CM

## 2024-11-06 DIAGNOSIS — O365931 Maternal care for other known or suspected poor fetal growth, third trimester, fetus 1: Secondary | ICD-10-CM | POA: Diagnosis not present

## 2024-11-06 DIAGNOSIS — Z8679 Personal history of other diseases of the circulatory system: Secondary | ICD-10-CM | POA: Diagnosis not present

## 2024-11-06 DIAGNOSIS — O0992 Supervision of high risk pregnancy, unspecified, second trimester: Secondary | ICD-10-CM

## 2024-11-06 DIAGNOSIS — O30042 Twin pregnancy, dichorionic/diamniotic, second trimester: Secondary | ICD-10-CM

## 2024-11-06 DIAGNOSIS — O365922 Maternal care for other known or suspected poor fetal growth, second trimester, fetus 2: Secondary | ICD-10-CM

## 2024-11-06 DIAGNOSIS — O365921 Maternal care for other known or suspected poor fetal growth, second trimester, fetus 1: Secondary | ICD-10-CM

## 2024-11-06 DIAGNOSIS — O358XX2 Maternal care for other (suspected) fetal abnormality and damage, fetus 2: Secondary | ICD-10-CM

## 2024-11-06 DIAGNOSIS — Z8759 Personal history of other complications of pregnancy, childbirth and the puerperium: Secondary | ICD-10-CM

## 2024-11-06 DIAGNOSIS — O365990 Maternal care for other known or suspected poor fetal growth, unspecified trimester, not applicable or unspecified: Secondary | ICD-10-CM

## 2024-11-06 DIAGNOSIS — O365932 Maternal care for other known or suspected poor fetal growth, third trimester, fetus 2: Secondary | ICD-10-CM

## 2024-11-06 DIAGNOSIS — O358XX1 Maternal care for other (suspected) fetal abnormality and damage, fetus 1: Secondary | ICD-10-CM

## 2024-11-06 NOTE — Procedures (Signed)
 Darlene Ortiz Jul 17, 1996 [redacted]w[redacted]d  Fetus A Non-Stress Test Interpretation for 11/06/24  Indication: IUGR and Twin pregnancy  Fetal Heart Rate A Mode: External Baseline Rate (A): 155 bpm Variability: Moderate Accelerations: 10 x 10, 15 x 15 Decelerations: None Multiple birth?: Yes  Uterine Activity Mode: Palpation Contraction Frequency (min): none noted Resting Tone Palpated: Relaxed  Interpretation (Fetal Testing) Nonstress Test Interpretation: Reactive Comments: Reviewed with Dr. Ileana Beverlie Remington 03-Jul-1996 [redacted]w[redacted]d   Fetus B Non-Stress Test Interpretation for 11/06/24  Indication: IUGR and Twin pregnancy  Fetal Heart Rate Fetus B Mode: External Baseline Rate (B): 150 BPM Variability: Moderate Accelerations: 10 x 10 Decelerations: None  Uterine Activity Mode: Palpation Contraction Frequency (min): none noted Resting Tone Palpated: Relaxed  Interpretation (Baby B - Fetal Testing) Nonstress Test Interpretation (Baby B): Reactive Comments (Baby B): Reviewed with Dr. Ileana

## 2024-11-07 ENCOUNTER — Ambulatory Visit

## 2024-11-07 NOTE — Progress Notes (Signed)
 MFM Consult Note  Darlene Ortiz is currently at [redacted]w[redacted]d. She has been followed due to IUGR of both twin A and twin B in a spontaneously conceived dichorionic, diamniotic twin gestation.   She denies any problems since her last exam and reports feeling fetal movements of both fetuses throughout the day.    There was normal amniotic fluid noted on today's ultrasound exam around both twin A and twin B.  Doppler studies of the umbilical arteries performed due to IUGR showed a normal S/D ratio for both twin A and twin B.  There were no signs of absent or reversed end-diastolic flow noted today in either fetus.  She had a reactive NST for both twin A and twin B today.  Fetal movements and fetal breathing movements were also noted on today's ultrasound exam in both fetuses.  An echogenic focus was noted in the left ventricle of the fetal heart in twin A.  Echogenic Focus in twin A  The small association between an echogenic focus and Down syndrome was discussed.   It appears that her [facility] cell free DNA test that was drawn in September 2025 only reported results as a singleton gestation.  I had our genetic counselor contact the lab, [facility] to have them recalculate her cell free DNA test for a twin gestation  Due to the echogenic focus noted today, the patient was offered and declined an amniocentesis today for definitive diagnosis of fetal aneuploidy.    We will await the results from her cell free DNA test.  IUGR of twin A and twin B  Due to IUGR, we will continue to follow her with weekly fetal testing and umbilical artery Doppler studies.    Fetal kick count instructions were reviewed.    She understands that should IUGR continue to be noted later in her pregnancy, delivery will be recommended at around 36 weeks.  She will return in 1 week for another NST and umbilical artery Doppler study.   The patient stated that all of her questions were answered.   A total of 20 minutes was spent  counseling and coordinating the care for this patient.  Greater than 50% of the time was spent in direct face-to-face contact.

## 2024-11-08 LAB — PANORAMA PRENATAL TEST FULL PANEL:PANORAMA TEST PLUS 5 ADDITIONAL MICRODELETIONS: FETAL FRACTION: 6.7

## 2024-11-10 ENCOUNTER — Ambulatory Visit

## 2024-11-10 VITALS — BP 102/65 | HR 98 | Temp 98.3°F | Resp 16 | Ht 67.0 in | Wt 197.0 lb

## 2024-11-10 DIAGNOSIS — D509 Iron deficiency anemia, unspecified: Secondary | ICD-10-CM

## 2024-11-10 DIAGNOSIS — Z3A29 29 weeks gestation of pregnancy: Secondary | ICD-10-CM

## 2024-11-10 DIAGNOSIS — O99013 Anemia complicating pregnancy, third trimester: Secondary | ICD-10-CM

## 2024-11-10 MED ORDER — IRON SUCROSE 200 MG IVPB - SIMPLE MED
200.0000 mg | Freq: Once | Status: AC
  Administered 2024-11-10: 200 mg via INTRAVENOUS
  Filled 2024-11-10: qty 110

## 2024-11-10 NOTE — Progress Notes (Signed)
 Diagnosis: Iron  Deficiency Anemia  Provider:  Praveen Mannam MD  Procedure: IV Infusion  IV Type: Peripheral, IV Location: R Antecubital   Venofer  (Iron  Sucrose), Dose: 200 mg  Infusion Start Time: 1435  Infusion Stop Time: 1452  Post Infusion IV Care: Patient declined observation and Peripheral IV Discontinued  Discharge: Condition: Good, Destination: Home . AVS Declined  Performed by:  Leita FORBES Miles, LPN

## 2024-11-11 ENCOUNTER — Other Ambulatory Visit

## 2024-11-11 ENCOUNTER — Ambulatory Visit

## 2024-11-12 ENCOUNTER — Ambulatory Visit

## 2024-11-12 VITALS — BP 106/60 | HR 78 | Temp 98.0°F | Resp 16 | Ht 67.0 in | Wt 200.8 lb

## 2024-11-12 DIAGNOSIS — O99013 Anemia complicating pregnancy, third trimester: Secondary | ICD-10-CM

## 2024-11-12 DIAGNOSIS — Z3A30 30 weeks gestation of pregnancy: Secondary | ICD-10-CM

## 2024-11-12 MED ORDER — IRON SUCROSE 200 MG IVPB - SIMPLE MED
200.0000 mg | Freq: Once | Status: AC
  Administered 2024-11-12: 200 mg via INTRAVENOUS
  Filled 2024-11-12: qty 110

## 2024-11-12 NOTE — Progress Notes (Signed)
 Diagnosis:  Acute Anemia  Provider:  Mannam, Praveen MD  Procedure: IV Infusion  IV Type: Peripheral, IV Location: R Antecubital  Venofer  (Iron  Sucrose), Dose: 200 mg  Infusion Start Time: 1434  Infusion Stop Time: 1453  Post Infusion IV Care: Patient declined observation and Peripheral IV Discontinued  Discharge: Condition: Stable, Destination: Home . AVS Declined  Performed by:  Rocky FORBES Sar, RN

## 2024-11-13 ENCOUNTER — Other Ambulatory Visit: Attending: Obstetrics and Gynecology

## 2024-11-13 ENCOUNTER — Other Ambulatory Visit: Payer: Self-pay | Admitting: *Deleted

## 2024-11-13 ENCOUNTER — Ambulatory Visit: Admitting: Obstetrics and Gynecology

## 2024-11-13 ENCOUNTER — Ambulatory Visit

## 2024-11-13 ENCOUNTER — Other Ambulatory Visit: Payer: Self-pay | Admitting: Maternal & Fetal Medicine

## 2024-11-13 ENCOUNTER — Encounter: Payer: Self-pay | Admitting: Obstetrics and Gynecology

## 2024-11-13 VITALS — BP 119/60 | HR 96

## 2024-11-13 DIAGNOSIS — O365990 Maternal care for other known or suspected poor fetal growth, unspecified trimester, not applicable or unspecified: Secondary | ICD-10-CM

## 2024-11-13 DIAGNOSIS — Z8679 Personal history of other diseases of the circulatory system: Secondary | ICD-10-CM

## 2024-11-13 DIAGNOSIS — O358XX1 Maternal care for other (suspected) fetal abnormality and damage, fetus 1: Secondary | ICD-10-CM | POA: Insufficient documentation

## 2024-11-13 DIAGNOSIS — O365921 Maternal care for other known or suspected poor fetal growth, second trimester, fetus 1: Secondary | ICD-10-CM | POA: Insufficient documentation

## 2024-11-13 DIAGNOSIS — O30043 Twin pregnancy, dichorionic/diamniotic, third trimester: Secondary | ICD-10-CM

## 2024-11-13 DIAGNOSIS — Z8759 Personal history of other complications of pregnancy, childbirth and the puerperium: Secondary | ICD-10-CM | POA: Insufficient documentation

## 2024-11-13 DIAGNOSIS — O0992 Supervision of high risk pregnancy, unspecified, second trimester: Secondary | ICD-10-CM | POA: Insufficient documentation

## 2024-11-13 DIAGNOSIS — O358XX2 Maternal care for other (suspected) fetal abnormality and damage, fetus 2: Secondary | ICD-10-CM | POA: Diagnosis present

## 2024-11-13 DIAGNOSIS — O365922 Maternal care for other known or suspected poor fetal growth, second trimester, fetus 2: Secondary | ICD-10-CM

## 2024-11-13 DIAGNOSIS — Z3A30 30 weeks gestation of pregnancy: Secondary | ICD-10-CM | POA: Insufficient documentation

## 2024-11-13 DIAGNOSIS — O30042 Twin pregnancy, dichorionic/diamniotic, second trimester: Secondary | ICD-10-CM | POA: Diagnosis present

## 2024-11-13 NOTE — Progress Notes (Signed)
 Maternal-Fetal Medicine Consultation  Name: Darlene Ortiz  MRN: [id number]  GA: [id number] [redacted]w[redacted]d   Dichorionic-diamniotic twin pregnancy with fetal growth restrictions in both twins.  On ultrasound performed 2 weeks ago, the estimated fetal weight of twin A was at the 3rd percentile and that of twin B was at the 6th percentile. She does not have gestational diabetes.  Blood pressure today at our office is 119/60 mmHg.  Ultrasound Twin A: Maternal left, cephalic presentation, anterior placenta, female fetus.  Normal amniotic fluid.  Umbilical artery Doppler showed normal forward diastolic flow.  NST is reactive. Twin B: Maternal right, breech presentation, anterior placenta, female fetus. Normal amniotic fluid.  Umbilical artery Doppler showed normal forward diastolic flow.  NST is reactive.  Chorionicity and cell-free fetal DNA screening Patient is unaware of the cell-free fetal DNA screening results.  I gave her a copy of cell-free fetal DNA screening result and reassured her that there is no increased risk for fetal aneuploidies including Down syndrome.  The report mention monozygotic twins.  I reviewed her 8-week ultrasound images that confirmed dichorionic-diamniotic twin pregnancy.  I explained zygosity and chorionicity with the help of ultrasound images and diagrams.  I counseled the patient that monozygotic twins are present in 25% of dichorionic-diamniotic twin pregnancies.  Low risk for aneuploidies applies to both twins and discordant karyotype (heterokaryotypia) is only rarely present.  Both fetuses are likely to carry same genetic information (identical twins).  However, dichorionic-diamniotic twin pregnancies carry for less risks than monochorionic-diamniotic twin pregnancies.  Fetal growth restriction I discussed the importance of weekly antenatal testing till delivery.  I discussed timing of delivery.  If fetal growth restriction persists, we will recommend delivery at 2- or  37-weeks' gestation.  If severe fetal growth restriction is seen in either twin, delivery at [redacted] weeks gestation is reasonable.  Recommendations -Continue weekly antenatal testing till delivery. - If fetal growth restriction persists, delivery at 42- or 37-weeks' gestation should be considered. - If severe fetal growth restriction is present in either twin, we recommend delivery at [redacted] weeks gestation.   Consultation including face-to-face (more than 50%) counseling 30 minutes.

## 2024-11-13 NOTE — Procedures (Addendum)
 Darlene Ortiz Apr 23, 1996 [redacted]w[redacted]d  Fetus A Non-Stress Test Interpretation for 11/13/2024  Indication: IUGR and Twin pregnancy  Fetal Heart Rate A Mode: External Baseline Rate (A): 140 bpm Variability: Moderate Accelerations: 15 x 15, 10 x 10 Decelerations: None Multiple birth?: No  Uterine Activity Mode: Palpation Contraction Frequency (min): none noted Resting Tone Palpated: Relaxed  Interpretation (Fetal Testing) Nonstress Test Interpretation: Reactive Comments: Reviewed with Dr. Arna Beverlie Remington 11/22/1996 [redacted]w[redacted]d   Fetus B Non-Stress Test Interpretation for 11/13/2024  Indication: IUGR and Twin pregnancy  Fetal Heart Rate Fetus B Mode: External Baseline Rate (B): 130 BPM Variability: Moderate Accelerations: 15 x 15, 10 x 10 Decelerations: None  Uterine Activity Mode: Palpation Contraction Frequency (min): none noted Resting Tone Palpated: Relaxed  Interpretation (Baby B - Fetal Testing) Nonstress Test Interpretation (Baby B): Reactive Comments (Baby B): Reviewed with Dr. Arna

## 2024-11-14 ENCOUNTER — Encounter: Payer: Self-pay | Admitting: Family Medicine

## 2024-11-14 ENCOUNTER — Other Ambulatory Visit: Payer: Self-pay

## 2024-11-14 ENCOUNTER — Ambulatory Visit: Admitting: Family Medicine

## 2024-11-14 ENCOUNTER — Ambulatory Visit

## 2024-11-14 VITALS — BP 111/76 | HR 98 | Wt 200.0 lb

## 2024-11-14 VITALS — BP 121/70 | HR 100 | Temp 98.0°F | Resp 18 | Ht 67.0 in | Wt 202.0 lb

## 2024-11-14 DIAGNOSIS — O365990 Maternal care for other known or suspected poor fetal growth, unspecified trimester, not applicable or unspecified: Secondary | ICD-10-CM

## 2024-11-14 DIAGNOSIS — Z3A30 30 weeks gestation of pregnancy: Secondary | ICD-10-CM

## 2024-11-14 DIAGNOSIS — O0992 Supervision of high risk pregnancy, unspecified, second trimester: Secondary | ICD-10-CM

## 2024-11-14 DIAGNOSIS — O30043 Twin pregnancy, dichorionic/diamniotic, third trimester: Secondary | ICD-10-CM

## 2024-11-14 DIAGNOSIS — Z8679 Personal history of other diseases of the circulatory system: Secondary | ICD-10-CM

## 2024-11-14 DIAGNOSIS — O99013 Anemia complicating pregnancy, third trimester: Secondary | ICD-10-CM | POA: Diagnosis not present

## 2024-11-14 DIAGNOSIS — O0993 Supervision of high risk pregnancy, unspecified, third trimester: Secondary | ICD-10-CM

## 2024-11-14 DIAGNOSIS — Z8759 Personal history of other complications of pregnancy, childbirth and the puerperium: Secondary | ICD-10-CM

## 2024-11-14 DIAGNOSIS — O365930 Maternal care for other known or suspected poor fetal growth, third trimester, not applicable or unspecified: Secondary | ICD-10-CM

## 2024-11-14 MED ORDER — IRON SUCROSE 200 MG IVPB - SIMPLE MED
200.0000 mg | Freq: Once | Status: AC
  Administered 2024-11-14: 200 mg via INTRAVENOUS
  Filled 2024-11-14: qty 110

## 2024-11-14 NOTE — Progress Notes (Signed)
 PRENATAL VISIT NOTE  Subjective:  Darlene Ortiz is a 28 y.o. G3P1011 at [redacted]w[redacted]d being seen today for ongoing prenatal care.  She is currently monitored for the following issues for this low-risk pregnancy and has ASCUS with positive high risk HPV cervical; Anemia complicating pregnancy, third trimester; Secondary syphilis, relapse (treated); Supervision of high risk pregnancy, antepartum, second trimester; History of postpartum hypertension; Twin pregnancy, twins dichorionic and diamniotic; and Fetal growth restriction antepartum on their problem list.  Patient reports no complaints.  Contractions: Not present. Vag. Bleeding: None.  Movement: Present. Denies leaking of fluid.   The following portions of the patient's history were reviewed and updated as appropriate: allergies, current medications, past family history, past medical history, past social history, past surgical history and problem list.   Objective:   Vitals:   11/14/24 0935  BP: 111/76  Pulse: 98  Weight: 200 lb (90.7 kg)    Fetal Status:  Fetal Heart Rate (bpm): 148/144   Movement: Present    General: Alert, oriented and cooperative. Patient is in no acute distress.  Skin: Skin is warm and dry. No rash noted.   Cardiovascular: Normal heart rate noted  Respiratory: Normal respiratory effort, no problems with respiration noted  Abdomen: Soft, gravid, appropriate for gestational age.  Pain/Pressure: Absent     Pelvic: Cervical exam deferred        Extremities: Normal range of motion.  Edema: None  Mental Status: Normal mood and affect. Normal behavior. Normal judgment and thought content.      07/09/2024    5:07 PM 05/14/2023   11:32 AM 01/05/2023    9:24 AM  Depression screen PHQ 2/9  Decreased Interest 0 0 0  Down, Depressed, Hopeless 0 0 0  PHQ - 2 Score 0 0 0  Altered sleeping 0 0 0  Tired, decreased energy 0 0 1  Change in appetite 0 0 0  Feeling bad or failure about yourself  0 0 0  Trouble concentrating 0 0 0   Moving slowly or fidgety/restless 0 0 0  Suicidal thoughts 0 0 0  PHQ-9 Score 0  0  1   Difficult doing work/chores   Not difficult at all     Data saved with a previous flowsheet row definition        07/09/2024    5:07 PM 05/14/2023   11:33 AM 01/05/2023    9:25 AM 11/01/2022    1:52 PM  GAD 7 : Generalized Anxiety Score  Nervous, Anxious, on Edge 0 0 0 1  Control/stop worrying 0 0 0 0  Worry too much - different things 0 0 2 1  Trouble relaxing 0 0 0 0  Restless 0 0 0 0  Easily annoyed or irritable 0 0 0 0  Afraid - awful might happen 0 0 0 0  Total GAD 7 Score 0 0 2 2  Anxiety Difficulty   Not difficult at all     Assessment and Plan:  Pregnancy: G3P1011 at [redacted]w[redacted]d 1. Dichorionic diamniotic twin pregnancy in third trimester (Primary) Regular growth US  are planned Antenatal testng starting at 32 weeks IOL at 86-37 given FGR  2. Supervision of high risk pregnancy, antepartum, second trimester Up to date FH elevated as expected FHR WNL x 2 and vigorous movement x2 Concerns: None  3. History of postpartum hypertension Monitor PP  4. Fetal growth restriction antepartum Twin A 3rd% and Twin B 6th% Will need IOL at 36-37 week  Will be about 1/21-1/28-->  plan to schedule in January  5. Anemia complicating pregnancy, third trimester Started infusions this week Will get additional today and monday  Preterm labor symptoms and general obstetric precautions including but not limited to vaginal bleeding, contractions, leaking of fluid and fetal movement were reviewed in detail with the patient. Please refer to After Visit Summary for other counseling recommendations.   No follow-ups on file.  Future Appointments  Date Time Provider Department Center  11/14/2024  3:15 PM [facility]-CHAIR 8 [facility] None  11/17/2024  3:00 PM [facility]-CHAIR 7 [facility] None  11/20/2024  2:15 PM [facility] PROVIDER 1 [facility]  11/20/2024  3:30 PM [facility] US2 [facility]  11/25/2024 11:15 AM  Lola Donnice HERO, MD [facility]  12/09/2024 11:15 AM Lola Donnice HERO, MD [facility]  12/23/2024 10:35 AM Lola Donnice HERO, MD [facility]    Suzen Maryan Masters, MD

## 2024-11-14 NOTE — Progress Notes (Signed)
 Diagnosis: Anemia complicating pregnancy   Provider:  Lonna Coder MD  Procedure: IV Infusion  IV Type: Peripheral, IV Location: L Antecubital  , Venofer  (Iron  Sucrose), Dose: 200 mg  Infusion Start Time: 1150  Infusion Stop Time: 1206  Post Infusion IV Care: Patient declined observation and Peripheral IV Discontinued  Discharge: Condition: Good, Destination: Home . AVS Declined  Performed by:  Lafawn Lenoir, RN

## 2024-11-17 ENCOUNTER — Ambulatory Visit

## 2024-11-19 ENCOUNTER — Ambulatory Visit

## 2024-11-19 VITALS — BP 93/56 | HR 102 | Temp 97.7°F | Resp 16 | Ht 67.0 in | Wt 204.4 lb

## 2024-11-19 DIAGNOSIS — O99013 Anemia complicating pregnancy, third trimester: Secondary | ICD-10-CM

## 2024-11-19 DIAGNOSIS — Z3A31 31 weeks gestation of pregnancy: Secondary | ICD-10-CM

## 2024-11-19 MED ORDER — IRON SUCROSE 200 MG IVPB - SIMPLE MED
200.0000 mg | Freq: Once | Status: AC
  Administered 2024-11-19: 15:00:00 200 mg via INTRAVENOUS
  Filled 2024-11-19: qty 110

## 2024-11-19 NOTE — Progress Notes (Cosign Needed)
 Diagnosis:  Anemia complicating pregnancy    Provider:  Lonna Coder MD  Procedure: IV Infusion  IV Type: Peripheral, IV Location: R Antecubital  , Venofer  (Iron  Sucrose), Dose: 200 mg  Infusion Start Time: 1449  Infusion Stop Time: 1507  Post Infusion IV Care: Observation period completed and Peripheral IV Discontinued  Discharge: Condition: Good, Destination: Home . AVS Declined  Performed by:  Jvion Turgeon, RN

## 2024-11-20 ENCOUNTER — Other Ambulatory Visit

## 2024-11-20 ENCOUNTER — Other Ambulatory Visit: Payer: Self-pay | Admitting: Maternal & Fetal Medicine

## 2024-11-20 ENCOUNTER — Ambulatory Visit

## 2024-11-20 VITALS — BP 114/62 | HR 99

## 2024-11-20 DIAGNOSIS — O365922 Maternal care for other known or suspected poor fetal growth, second trimester, fetus 2: Secondary | ICD-10-CM

## 2024-11-20 DIAGNOSIS — O365990 Maternal care for other known or suspected poor fetal growth, unspecified trimester, not applicable or unspecified: Secondary | ICD-10-CM | POA: Insufficient documentation

## 2024-11-20 DIAGNOSIS — Z8679 Personal history of other diseases of the circulatory system: Secondary | ICD-10-CM | POA: Diagnosis present

## 2024-11-20 DIAGNOSIS — O358XX1 Maternal care for other (suspected) fetal abnormality and damage, fetus 1: Secondary | ICD-10-CM

## 2024-11-20 DIAGNOSIS — O365932 Maternal care for other known or suspected poor fetal growth, third trimester, fetus 2: Secondary | ICD-10-CM

## 2024-11-20 DIAGNOSIS — O30043 Twin pregnancy, dichorionic/diamniotic, third trimester: Secondary | ICD-10-CM

## 2024-11-20 DIAGNOSIS — O365921 Maternal care for other known or suspected poor fetal growth, second trimester, fetus 1: Secondary | ICD-10-CM | POA: Diagnosis present

## 2024-11-20 DIAGNOSIS — O358XX2 Maternal care for other (suspected) fetal abnormality and damage, fetus 2: Secondary | ICD-10-CM | POA: Diagnosis present

## 2024-11-20 DIAGNOSIS — O30042 Twin pregnancy, dichorionic/diamniotic, second trimester: Secondary | ICD-10-CM | POA: Diagnosis present

## 2024-11-20 DIAGNOSIS — Z3A31 31 weeks gestation of pregnancy: Secondary | ICD-10-CM | POA: Diagnosis present

## 2024-11-20 DIAGNOSIS — O0992 Supervision of high risk pregnancy, unspecified, second trimester: Secondary | ICD-10-CM

## 2024-11-20 DIAGNOSIS — O365930 Maternal care for other known or suspected poor fetal growth, third trimester, not applicable or unspecified: Secondary | ICD-10-CM

## 2024-11-20 DIAGNOSIS — Z8759 Personal history of other complications of pregnancy, childbirth and the puerperium: Secondary | ICD-10-CM | POA: Insufficient documentation

## 2024-11-20 DIAGNOSIS — O365931 Maternal care for other known or suspected poor fetal growth, third trimester, fetus 1: Secondary | ICD-10-CM

## 2024-11-20 DIAGNOSIS — O09293 Supervision of pregnancy with other poor reproductive or obstetric history, third trimester: Secondary | ICD-10-CM

## 2024-11-20 NOTE — Progress Notes (Signed)
 Patient information  Patient Name: Darlene Ortiz  Patient MRN:   [id number]  Referring practice: MFM Referring Provider: [facility])  Problem List   Patient Active Problem List   Diagnosis Date Noted   Fetal growth restriction antepartum 10/28/2024   Twin pregnancy, twins dichorionic and diamniotic 08/27/2024   Supervision of high risk pregnancy, antepartum, second trimester 07/02/2024   History of postpartum hypertension 07/02/2024   Secondary syphilis, relapse (treated) 10/02/2023   Anemia complicating pregnancy, third trimester 01/09/2023   ASCUS with positive high risk HPV cervical 12/22/2022    Maternal Fetal medicine Consult  Darlene Ortiz is a 28 y.o. G3P1011 at [redacted]w[redacted]d here for ultrasound and consultation. Ellsie Brennen is doing well today with no acute concerns. Today we focused on the following:   The patient is doing well today.  She is here for a twin pregnancy complicated by growth restriction of twin A and then low growth but still normal of twin B.  Dopplers are normal.  BPP was 8 out of 8 for each fetus.  I discussed delivery timing is likely going to be on 36 weeks but will be determined by her 34-week growth ultrasound.  We discussed the route of delivery depends on fetal presentation as well as fetal growth discordance but at this time as long as the twin A remains cephalic there should be no reason a vaginal delivery cannot be considered unless the clinical picture changes.  She reports good fetal movement and we discussed the importance of monitoring this throughout her pregnancy and going to the MAU with any concerns. We discussed the goal of antenatal testing is to prevent stillbirth as long as the testing is normal.   Standard OB precautions were given to the patient. The patient had time to ask questions that were answered to her satisfaction.  She verbalized understanding and agrees to proceed with the plan above.   I spent 30 minutes  reviewing the patients chart, including labs and images as well as counseling the patient about her medical conditions. Greater than 50% of the time was spent in direct face-to-face patient counseling.  Delora Smaller  MFM, [place]   11/20/2024  3:36 PM   Review of Systems: A review of systems was performed and was negative except per HPI   Vitals and Physical Exam    11/20/2024    2:29 PM 11/19/2024    3:08 PM 11/19/2024    2:38 PM  Vitals with BMI  Height   5' 7  Weight   204 lbs 6 oz  BMI   32.01  Systolic 114 93 124  Diastolic 62 56 79  Pulse 99 102 105    Sitting comfortably on the sonogram table Nonlabored breathing Normal rate and rhythm Abdomen is nontender  Past pregnancies OB History  Gravida Para Term Preterm AB Living  3 1 1  1 1   SAB IAB Ectopic Multiple Live Births   1  0 1    # Outcome Date GA Lbr Len/2nd Weight Sex Type Anes PTL Lv  3 Current           2 Term 04/07/23 [redacted]w[redacted]d 03:29 / 01:10 8 lb 1.5 oz (3.67 kg) F Vag-Spont EPI  LIV     Birth Comments: WDL  1 IAB 04/20/20 [redacted]w[redacted]d            Future Appointments  Date Time Provider Department Center  11/25/2024 11:15 AM Lola Donnice HERO, MD [facility]  [facility]  12/01/2024  9:15 AM [facility] PROVIDER 1 [facility]  12/01/2024  9:30 AM [facility] US4 [facility]  12/09/2024 11:15 AM Lola Donnice HERO, MD [facility]  12/12/2024  3:15 PM [facility] PROVIDER 1 [facility]  12/12/2024  3:30 PM [facility] US3 [facility]  12/23/2024 10:35 AM Lola, Donnice HERO, MD [facility]

## 2024-11-25 ENCOUNTER — Ambulatory Visit (INDEPENDENT_AMBULATORY_CARE_PROVIDER_SITE_OTHER): Admitting: Family Medicine

## 2024-11-25 ENCOUNTER — Other Ambulatory Visit: Payer: Self-pay

## 2024-11-25 ENCOUNTER — Encounter: Payer: Self-pay | Admitting: Family Medicine

## 2024-11-25 VITALS — BP 119/76 | HR 105 | Wt 205.0 lb

## 2024-11-25 DIAGNOSIS — R87610 Atypical squamous cells of undetermined significance on cytologic smear of cervix (ASC-US): Secondary | ICD-10-CM

## 2024-11-25 DIAGNOSIS — A5149 Other secondary syphilitic conditions: Secondary | ICD-10-CM

## 2024-11-25 DIAGNOSIS — Z3A31 31 weeks gestation of pregnancy: Secondary | ICD-10-CM

## 2024-11-25 DIAGNOSIS — Z8759 Personal history of other complications of pregnancy, childbirth and the puerperium: Secondary | ICD-10-CM

## 2024-11-25 DIAGNOSIS — O99013 Anemia complicating pregnancy, third trimester: Secondary | ICD-10-CM

## 2024-11-25 DIAGNOSIS — O30043 Twin pregnancy, dichorionic/diamniotic, third trimester: Secondary | ICD-10-CM

## 2024-11-25 DIAGNOSIS — O365990 Maternal care for other known or suspected poor fetal growth, unspecified trimester, not applicable or unspecified: Secondary | ICD-10-CM

## 2024-11-25 DIAGNOSIS — O0993 Supervision of high risk pregnancy, unspecified, third trimester: Secondary | ICD-10-CM

## 2024-11-25 DIAGNOSIS — Z8679 Personal history of other diseases of the circulatory system: Secondary | ICD-10-CM

## 2024-11-25 DIAGNOSIS — O365930 Maternal care for other known or suspected poor fetal growth, third trimester, not applicable or unspecified: Secondary | ICD-10-CM

## 2024-11-25 DIAGNOSIS — R87810 Cervical high risk human papillomavirus (HPV) DNA test positive: Secondary | ICD-10-CM

## 2024-11-25 DIAGNOSIS — O0992 Supervision of high risk pregnancy, unspecified, second trimester: Secondary | ICD-10-CM

## 2024-11-25 NOTE — Progress Notes (Signed)
" ° °  Subjective:  Darlene Ortiz is a 28 y.o. G3P1011 at [redacted]w[redacted]d being seen today for ongoing prenatal care.  She is currently monitored for the following issues for this high-risk pregnancy and has ASCUS with positive high risk HPV cervical; Anemia complicating pregnancy, third trimester; Secondary syphilis, relapse (treated); Supervision of high risk pregnancy, antepartum, second trimester; History of postpartum hypertension; Twin pregnancy, twins dichorionic and diamniotic; and Fetal growth restriction antepartum on their problem list.  Patient reports no complaints.  Contractions: Not present. Vag. Bleeding: None.  Movement: Present. Denies leaking of fluid.   The following portions of the patient's history were reviewed and updated as appropriate: allergies, current medications, past family history, past medical history, past social history, past surgical history and problem list. Problem list updated.  Objective:   Vitals:   11/25/24 1134  BP: 119/76  Pulse: (!) 105  Weight: 205 lb (93 kg)    Fetal Status: Fetal Heart Rate (bpm): 139/143   Movement: Present     General:  Alert, oriented and cooperative. Patient is in no acute distress.  Skin: Skin is warm and dry. No rash noted.   Cardiovascular: Normal heart rate noted  Respiratory: Normal respiratory effort, no problems with respiration noted  Abdomen: Soft, gravid, appropriate for gestational age. Pain/Pressure: Absent     Pelvic: Vag. Bleeding: None     Cervical exam deferred        Extremities: Normal range of motion.  Edema: None  Mental Status: Normal mood and affect. Normal behavior. Normal judgment and thought content.   Urinalysis:      Assessment and Plan:  Pregnancy: G3P1011 at [redacted]w[redacted]d  1. Supervision of high risk pregnancy, antepartum, second trimester (Primary) BP and both FHR normal Offer RSV next visit given likely delivering around 36 weeks Obtain swabs next visit  2. Dichorionic diamniotic twin pregnancy in  third trimester See below F/w MFM  3. Fetal growth restriction antepartum Last growth US  11/20/2024 Twin A EFW 1442g 7%, AC 19%, AFI wnl, normal UAD Twin B EFW 1500g 11%, AC 24%, AFI wnl, normal UAD Discordance 4% F/w MFM, delivery likely around 36 wks pending next growth US   4. ASCUS with positive high risk HPV cervical Plan for post partum LEEP  5. Anemia complicating pregnancy, third trimester Lab Results  Component Value Date   HGB 8.5 (L) 10/28/2024   S/p IV iron , recheck prior to delivery  6. Secondary syphilis, relapse (treated) Stable titer 1:2 c/w adequate treatment  7. History of postpartum hypertension Normotensive, on ASA  Preterm labor symptoms and general obstetric precautions including but not limited to vaginal bleeding, contractions, leaking of fluid and fetal movement were reviewed in detail with the patient. Please refer to After Visit Summary for other counseling recommendations.  Return in 2 weeks (on 12/09/2024) for Dyad patient, ob visit.   Lola Donnice HERO, MD "

## 2024-11-25 NOTE — Patient Instructions (Signed)

## 2024-12-01 ENCOUNTER — Encounter: Payer: Self-pay | Admitting: Obstetrics

## 2024-12-01 ENCOUNTER — Ambulatory Visit (HOSPITAL_BASED_OUTPATIENT_CLINIC_OR_DEPARTMENT_OTHER): Admitting: Obstetrics

## 2024-12-01 ENCOUNTER — Ambulatory Visit: Attending: Obstetrics and Gynecology

## 2024-12-01 ENCOUNTER — Other Ambulatory Visit: Payer: Self-pay | Admitting: *Deleted

## 2024-12-01 VITALS — BP 122/61 | HR 86

## 2024-12-01 DIAGNOSIS — O365990 Maternal care for other known or suspected poor fetal growth, unspecified trimester, not applicable or unspecified: Secondary | ICD-10-CM | POA: Insufficient documentation

## 2024-12-01 DIAGNOSIS — O365931 Maternal care for other known or suspected poor fetal growth, third trimester, fetus 1: Secondary | ICD-10-CM | POA: Insufficient documentation

## 2024-12-01 DIAGNOSIS — Z3A32 32 weeks gestation of pregnancy: Secondary | ICD-10-CM | POA: Insufficient documentation

## 2024-12-01 DIAGNOSIS — O0992 Supervision of high risk pregnancy, unspecified, second trimester: Secondary | ICD-10-CM | POA: Diagnosis present

## 2024-12-01 DIAGNOSIS — O09293 Supervision of pregnancy with other poor reproductive or obstetric history, third trimester: Secondary | ICD-10-CM

## 2024-12-01 DIAGNOSIS — O365932 Maternal care for other known or suspected poor fetal growth, third trimester, fetus 2: Secondary | ICD-10-CM | POA: Insufficient documentation

## 2024-12-01 DIAGNOSIS — O30043 Twin pregnancy, dichorionic/diamniotic, third trimester: Secondary | ICD-10-CM | POA: Insufficient documentation

## 2024-12-01 DIAGNOSIS — Z8759 Personal history of other complications of pregnancy, childbirth and the puerperium: Secondary | ICD-10-CM | POA: Diagnosis present

## 2024-12-01 DIAGNOSIS — Z8679 Personal history of other diseases of the circulatory system: Secondary | ICD-10-CM | POA: Diagnosis present

## 2024-12-02 NOTE — Progress Notes (Signed)
 MFM Consult Note  Darlene Ortiz is currently at [redacted]w[redacted]d. She has been followed due to IUGR of both fetuses in a dichorionic, diamniotic twin gestation.    She denies any problems since her last exam and reports feeling vigorous fetal movements of both fetuses throughout the day.  There was normal amniotic fluid noted on today's ultrasound exam around both twin A and twin B.  Doppler studies of the umbilical arteries performed due to fetal growth restriction showed a normal S/D ratio for both twin A and twin B.  There were no signs of absent or reversed end-diastolic flow noted today in either fetus.  A biophysical profile performed today was 8 out of 8 for both twin A and twin B.  She will return in 1 week for growth scan, BPP, and umbilical artery Doppler study.    Should IUGR continue to be noted next week, delivery will be recommended at around 36 to 37 weeks as long as the fetal testing and umbilical artery Doppler studies remain within normal limits. .  The patient stated that all of her questions were answered.   A total of 20 minutes was spent counseling and coordinating the care for this patient.  Greater than 50% of the time was spent in direct face-to-face contact.

## 2024-12-03 ENCOUNTER — Other Ambulatory Visit: Payer: Self-pay | Admitting: *Deleted

## 2024-12-03 DIAGNOSIS — O365990 Maternal care for other known or suspected poor fetal growth, unspecified trimester, not applicable or unspecified: Secondary | ICD-10-CM

## 2024-12-04 NOTE — L&D Delivery Note (Signed)
 OB/GYN Faculty Practice Delivery Note  Darlene Ortiz is a 29 y.o. G3P1011 s/p SVD of di/di twins (cephalic -> breech) at [redacted]w[redacted]d. She was admitted for IOL due to di/di twin pregnancy.   ROM (Twin A): 4h 38m with clear fluid ROM (Twin B): at delivery, clear fluid GBS Status: Positive/-- (01/06 1213) adequately treated   Labor Progress: Initial SVE: 1/50/-3. She then progressed to complete.   Delivery Date/Time:   Twin A: 01/02/25 at 2102 Twin B: 01/02/25 at 2122  Delivery:   Twin A:  Called to room and patient was complete and pushing. Head delivered LOA. No nuchal cord present. Shoulder and body delivered in usual fashion. Infant with spontaneous cry, placed on mother's abdomen, dried and stimulated. Cord clamped x 2 after 1-minute delay, and cut by FOB.   Twin B:  Twin B found to be in breech position on bedside US  with head to maternal right. Attempted to facilitate bringing infant down but patient unable to tolerate due to pain. She began pushing. Infant presented frank breech en caul to the umbilicus when SROM occurred. Infant rotated to anterior sacrum position and delivery continued. Axilla visualized and infant rotated to facilicate delivery of the arms. Head flexed and delivered without difficulty.  Infant with spontaneous cry, placed on mother's abdomen, dried and stimulated. Cord clamped x 2 after 1-minute delay, and cut by provider.   Both placentas delivered spontaneously with gentle cord traction. Fundus firm with massage and Pitocin . TXA given for prophylaxis. Labia, perineum, vagina, and cervix inspected inspected with 2nd degree perineal laceration and bilateral labial lacerations noted.   Baby A Weight: 2590g Baby B Weight: 2670g   Placenta (both): Sent to pathology Complications: None Lacerations: 2nd degree perineal repaired with 3-0 vicryl. Bilateral labial lacerations, hemostatic.  EBL: 800 mL Analgesia: Epidural   Infant:  APGAR (1 MIN):    Darlene, Ortiz  [[id number]]  [address] [place] [[id number]]  6   APGAR (5 MINS):    Darlene, Ortiz [[id number]]  [address] [place] [[id number]]  9

## 2024-12-09 ENCOUNTER — Other Ambulatory Visit: Payer: Self-pay

## 2024-12-09 ENCOUNTER — Other Ambulatory Visit (HOSPITAL_COMMUNITY)
Admission: RE | Admit: 2024-12-09 | Discharge: 2024-12-09 | Disposition: A | Discharge status: Home / Self Care | Source: Ambulatory Visit | Attending: Family Medicine | Admitting: Family Medicine

## 2024-12-09 ENCOUNTER — Ambulatory Visit: Admitting: Family Medicine

## 2024-12-09 ENCOUNTER — Encounter: Payer: Self-pay | Admitting: Family Medicine

## 2024-12-09 VITALS — BP 120/80 | HR 109 | Wt 213.5 lb

## 2024-12-09 DIAGNOSIS — Z8759 Personal history of other complications of pregnancy, childbirth and the puerperium: Secondary | ICD-10-CM

## 2024-12-09 DIAGNOSIS — O0992 Supervision of high risk pregnancy, unspecified, second trimester: Secondary | ICD-10-CM

## 2024-12-09 DIAGNOSIS — O99013 Anemia complicating pregnancy, third trimester: Secondary | ICD-10-CM

## 2024-12-09 DIAGNOSIS — R87610 Atypical squamous cells of undetermined significance on cytologic smear of cervix (ASC-US): Secondary | ICD-10-CM

## 2024-12-09 DIAGNOSIS — O0993 Supervision of high risk pregnancy, unspecified, third trimester: Secondary | ICD-10-CM

## 2024-12-09 DIAGNOSIS — Z8679 Personal history of other diseases of the circulatory system: Secondary | ICD-10-CM

## 2024-12-09 DIAGNOSIS — O30043 Twin pregnancy, dichorionic/diamniotic, third trimester: Secondary | ICD-10-CM

## 2024-12-09 DIAGNOSIS — Z3A33 33 weeks gestation of pregnancy: Secondary | ICD-10-CM

## 2024-12-09 DIAGNOSIS — O2243 Hemorrhoids in pregnancy, third trimester: Secondary | ICD-10-CM

## 2024-12-09 DIAGNOSIS — A5149 Other secondary syphilitic conditions: Secondary | ICD-10-CM

## 2024-12-09 DIAGNOSIS — O365990 Maternal care for other known or suspected poor fetal growth, unspecified trimester, not applicable or unspecified: Secondary | ICD-10-CM

## 2024-12-09 DIAGNOSIS — R87810 Cervical high risk human papillomavirus (HPV) DNA test positive: Secondary | ICD-10-CM

## 2024-12-09 DIAGNOSIS — O365930 Maternal care for other known or suspected poor fetal growth, third trimester, not applicable or unspecified: Secondary | ICD-10-CM

## 2024-12-09 MED ORDER — PROCTOCORT 1 % EX CREA: 1.0000 | TOPICAL_CREAM | CUTANEOUS | 1 refills | Status: DC | PRN

## 2024-12-09 MED ORDER — POLYETHYLENE GLYCOL 3350 17 GM/SCOOP PO POWD: 17.0000 g | Freq: Every day | ORAL | 1 refills | Status: DC | PRN

## 2024-12-09 NOTE — Patient Instructions (Signed)

## 2024-12-09 NOTE — Progress Notes (Signed)
" ° °  Subjective:  Darlene Ortiz is a 29 y.o. G3P1011 at [redacted]w[redacted]d being seen today for ongoing prenatal care.  She is currently monitored for the following issues for this high-risk pregnancy and has ASCUS with positive high risk HPV cervical; Anemia complicating pregnancy, third trimester; Secondary syphilis, relapse (treated); Supervision of high risk pregnancy, antepartum, second trimester; History of postpartum hypertension; Twin pregnancy, twins dichorionic and diamniotic; and Fetal growth restriction antepartum on their problem list.  Patient reports hemorrhoids.  Contractions: Not present. Vag. Bleeding: None.  Movement: Present. Denies leaking of fluid.   The following portions of the patient's history were reviewed and updated as appropriate: allergies, current medications, past family history, past medical history, past social history, past surgical history and problem list. Problem list updated.  Objective:   Vitals:   12/09/24 1133  BP: 120/80  Pulse: (!) 109  Weight: 213 lb 8 oz (96.8 kg)    Fetal Status: Fetal Heart Rate (bpm): 148/164   Movement: Present     General:  Alert, oriented and cooperative. Patient is in no acute distress.  Skin: Skin is warm and dry. No rash noted.   Cardiovascular: Normal heart rate noted  Respiratory: Normal respiratory effort, no problems with respiration noted  Abdomen: Soft, gravid, appropriate for gestational age. Pain/Pressure: Present     Pelvic: Vag. Bleeding: None     Cervical exam deferred        Extremities: Normal range of motion.  Edema: None  Mental Status: Normal mood and affect. Normal behavior. Normal judgment and thought content.   Urinalysis:      Assessment and Plan:  Pregnancy: G3P1011 at [redacted]w[redacted]d  1. Supervision of high risk pregnancy, antepartum, second trimester (Primary) BP and both FHR normal Self swab collection of vaginitis panel and GBS per patient preference Offered RSV, accepts but unfortunately we are out today,  will give at next visit if feasible based on delivery timing Dealing with hemorrhoids, rx sent for preparation H and miralax   2. Dichorionic diamniotic twin pregnancy in third trimester Last growth US  11/20/2024 Twin A EFW 1442g 7%, AC 19%, AFI wnl, normal UAD Twin B EFW 1500g 11%, AC 24%, AFI wnl, normal UAD Discordance 4% F/w MFM, delivery likely around 36 wks pending next growth US  Normal UAD and BPP to date Per patient she is getting growth scan at next  Discussed possibility of breech twin B extraction if needed, hopefully avoid c section  3. Fetal growth restriction antepartum See above  4. ASCUS with positive high risk HPV cervical Plan for postpartum LEEP  5. Anemia complicating pregnancy, third trimester Lab Results  Component Value Date   HGB 8.5 (L) 10/28/2024   S/p IV iron   6. Secondary syphilis, relapse (treated) Stable titer 1:2 c/w adequate treatment Requests RPR today for reassurance  7. History of postpartum hypertension Normotensive, on ASA  Preterm labor symptoms and general obstetric precautions including but not limited to vaginal bleeding, contractions, leaking of fluid and fetal movement were reviewed in detail with the patient. Please refer to After Visit Summary for other counseling recommendations.  Return in 2 weeks (on 12/23/2024) for Dyad patient, ob visit.   Lola Donnice HERO, MD "

## 2024-12-10 ENCOUNTER — Ambulatory Visit: Payer: Self-pay | Admitting: Family Medicine

## 2024-12-10 DIAGNOSIS — O0992 Supervision of high risk pregnancy, unspecified, second trimester: Secondary | ICD-10-CM

## 2024-12-10 LAB — CERVICOVAGINAL ANCILLARY ONLY
Bacterial Vaginitis (gardnerella): POSITIVE — AB
Candida Glabrata: NEGATIVE
Candida Vaginitis: NEGATIVE
Chlamydia: NEGATIVE
Comment: NEGATIVE
Comment: NEGATIVE
Comment: NEGATIVE
Comment: NEGATIVE
Comment: NEGATIVE
Comment: NORMAL
Neisseria Gonorrhea: NEGATIVE
Trichomonas: NEGATIVE

## 2024-12-10 LAB — SYPHILIS: RPR W/REFLEX TO RPR TITER AND TREPONEMAL ANTIBODIES, TRADITIONAL SCREENING AND DIAGNOSIS ALGORITHM: RPR Ser Ql: REACTIVE — AB

## 2024-12-10 LAB — HIV ANTIBODY (ROUTINE TESTING W REFLEX): HIV Screen 4th Generation wRfx: NONREACTIVE

## 2024-12-10 LAB — RPR, QUANT+TP ABS (REFLEX)
Rapid Plasma Reagin, Quant: 1:1 {titer} — ABNORMAL HIGH
T Pallidum Abs: REACTIVE — AB

## 2024-12-10 MED ORDER — METRONIDAZOLE 0.75 % VA GEL: 1.0000 | Freq: Every day | VAGINAL | 0 refills | Status: AC

## 2024-12-12 ENCOUNTER — Ambulatory Visit: Admitting: Obstetrics and Gynecology

## 2024-12-12 ENCOUNTER — Ambulatory Visit: Attending: Obstetrics and Gynecology

## 2024-12-12 VITALS — BP 129/66 | HR 92

## 2024-12-12 DIAGNOSIS — O365990 Maternal care for other known or suspected poor fetal growth, unspecified trimester, not applicable or unspecified: Secondary | ICD-10-CM | POA: Insufficient documentation

## 2024-12-12 DIAGNOSIS — O0992 Supervision of high risk pregnancy, unspecified, second trimester: Secondary | ICD-10-CM

## 2024-12-12 DIAGNOSIS — O365931 Maternal care for other known or suspected poor fetal growth, third trimester, fetus 1: Secondary | ICD-10-CM | POA: Diagnosis not present

## 2024-12-12 DIAGNOSIS — O30043 Twin pregnancy, dichorionic/diamniotic, third trimester: Secondary | ICD-10-CM | POA: Insufficient documentation

## 2024-12-12 DIAGNOSIS — Z3A34 34 weeks gestation of pregnancy: Secondary | ICD-10-CM | POA: Insufficient documentation

## 2024-12-12 DIAGNOSIS — Z8679 Personal history of other diseases of the circulatory system: Secondary | ICD-10-CM | POA: Diagnosis present

## 2024-12-12 DIAGNOSIS — Z8759 Personal history of other complications of pregnancy, childbirth and the puerperium: Secondary | ICD-10-CM | POA: Insufficient documentation

## 2024-12-12 DIAGNOSIS — O365932 Maternal care for other known or suspected poor fetal growth, third trimester, fetus 2: Secondary | ICD-10-CM

## 2024-12-12 DIAGNOSIS — O30042 Twin pregnancy, dichorionic/diamniotic, second trimester: Secondary | ICD-10-CM | POA: Diagnosis not present

## 2024-12-12 LAB — CULTURE, BETA STREP (GROUP B ONLY): Strep Gp B Culture: POSITIVE — AB

## 2024-12-12 NOTE — Progress Notes (Signed)
 Maternal-Fetal Medicine Consultation  Name: Darlene Ortiz  MRN: [id number]  GA: [id number] [redacted]w[redacted]d   Dichorionic-diamniotic twin pregnancy with selective fetal growth restriction in twin A. Cell-free fetal DNA screening showed monozygotic twins.  Twin A: Maternal left, cephalic presentation, anterior placenta.  Normal amniotic fluid.  The estimated fetal weight is at the 7th percentile and the abdominal circumference measurement is at the 4th percentile.  Interval weight gain is 546 g.  Umbilical artery Doppler showed normal forward diastolic flow.  BPP 8/8.  Twin B: Upper fetus, transverse lie and head to maternal right, anterior placenta.  Normal amniotic fluid.  Fetal growth is appropriate for gestational age.  Umbilical artery Doppler showed normal forward diastolic flow.  BPP 8/8.  Growth discordancy: 14% (normal.  I explained the finding of selective fetal growth restriction but good interval weight gain. Patient does not have hypertension.  Blood pressure today at our office is 129/66 mmHg. I counseled the patient that because of selective fetal growth restriction, we recommend delivery at [redacted] weeks gestation provided antenatal testing remains reassuring.  Recommendations - Continue weekly antenatal testing till delivery. - Delivery at [redacted] weeks gestation.     Consultation including face-to-face (more than 50%) counseling 10 minutes.

## 2024-12-17 ENCOUNTER — Ambulatory Visit: Attending: Obstetrics and Gynecology | Admitting: Obstetrics and Gynecology

## 2024-12-17 ENCOUNTER — Other Ambulatory Visit

## 2024-12-17 VITALS — BP 110/68

## 2024-12-17 DIAGNOSIS — O365932 Maternal care for other known or suspected poor fetal growth, third trimester, fetus 2: Secondary | ICD-10-CM | POA: Diagnosis not present

## 2024-12-17 DIAGNOSIS — Z3A35 35 weeks gestation of pregnancy: Secondary | ICD-10-CM

## 2024-12-17 DIAGNOSIS — Z3493 Encounter for supervision of normal pregnancy, unspecified, third trimester: Secondary | ICD-10-CM

## 2024-12-17 DIAGNOSIS — O09293 Supervision of pregnancy with other poor reproductive or obstetric history, third trimester: Secondary | ICD-10-CM | POA: Insufficient documentation

## 2024-12-17 DIAGNOSIS — O365931 Maternal care for other known or suspected poor fetal growth, third trimester, fetus 1: Secondary | ICD-10-CM | POA: Insufficient documentation

## 2024-12-17 DIAGNOSIS — Z8679 Personal history of other diseases of the circulatory system: Secondary | ICD-10-CM

## 2024-12-17 DIAGNOSIS — O30042 Twin pregnancy, dichorionic/diamniotic, second trimester: Secondary | ICD-10-CM

## 2024-12-17 DIAGNOSIS — O0992 Supervision of high risk pregnancy, unspecified, second trimester: Secondary | ICD-10-CM

## 2024-12-17 DIAGNOSIS — O365990 Maternal care for other known or suspected poor fetal growth, unspecified trimester, not applicable or unspecified: Secondary | ICD-10-CM

## 2024-12-17 NOTE — Progress Notes (Signed)
 After review, MFM consult with provider is not indicated for today  Arna Ranks, MD 12/17/2024 4:34 PM  [facility]

## 2024-12-23 ENCOUNTER — Other Ambulatory Visit: Payer: Self-pay

## 2024-12-23 ENCOUNTER — Encounter: Payer: Self-pay | Admitting: Family Medicine

## 2024-12-23 ENCOUNTER — Ambulatory Visit: Admitting: Family Medicine

## 2024-12-23 VITALS — BP 115/76 | HR 105 | Wt 217.0 lb

## 2024-12-23 DIAGNOSIS — Z8679 Personal history of other diseases of the circulatory system: Secondary | ICD-10-CM

## 2024-12-23 DIAGNOSIS — R87610 Atypical squamous cells of undetermined significance on cytologic smear of cervix (ASC-US): Secondary | ICD-10-CM

## 2024-12-23 DIAGNOSIS — A5149 Other secondary syphilitic conditions: Secondary | ICD-10-CM

## 2024-12-23 DIAGNOSIS — O365990 Maternal care for other known or suspected poor fetal growth, unspecified trimester, not applicable or unspecified: Secondary | ICD-10-CM

## 2024-12-23 DIAGNOSIS — O0992 Supervision of high risk pregnancy, unspecified, second trimester: Secondary | ICD-10-CM

## 2024-12-23 DIAGNOSIS — R87810 Cervical high risk human papillomavirus (HPV) DNA test positive: Secondary | ICD-10-CM

## 2024-12-23 DIAGNOSIS — Z8759 Personal history of other complications of pregnancy, childbirth and the puerperium: Secondary | ICD-10-CM

## 2024-12-23 DIAGNOSIS — Z3A35 35 weeks gestation of pregnancy: Secondary | ICD-10-CM

## 2024-12-23 DIAGNOSIS — O30043 Twin pregnancy, dichorionic/diamniotic, third trimester: Secondary | ICD-10-CM

## 2024-12-23 DIAGNOSIS — O365930 Maternal care for other known or suspected poor fetal growth, third trimester, not applicable or unspecified: Secondary | ICD-10-CM

## 2024-12-23 DIAGNOSIS — O0993 Supervision of high risk pregnancy, unspecified, third trimester: Secondary | ICD-10-CM

## 2024-12-23 DIAGNOSIS — O99820 Streptococcus B carrier state complicating pregnancy: Secondary | ICD-10-CM

## 2024-12-23 DIAGNOSIS — O99013 Anemia complicating pregnancy, third trimester: Secondary | ICD-10-CM

## 2024-12-23 NOTE — Progress Notes (Signed)
" ° °  Subjective:  Darlene Ortiz is a 29 y.o. G3P1011 at [redacted]w[redacted]d being seen today for ongoing prenatal care.  She is currently monitored for the following issues for this high-risk pregnancy and has ASCUS with positive high risk HPV cervical; Anemia complicating pregnancy, third trimester; GBS (group B Streptococcus carrier), +RV culture, currently pregnant; Secondary syphilis, relapse (treated); Supervision of high risk pregnancy, antepartum, second trimester; History of postpartum hypertension; Twin pregnancy, twins dichorionic and diamniotic; and Fetal growth restriction antepartum on their problem list.  Patient reports no complaints.  Contractions: Not present. Vag. Bleeding: None.  Movement: Present. Denies leaking of fluid.   The following portions of the patient's history were reviewed and updated as appropriate: allergies, current medications, past family history, past medical history, past social history, past surgical history and problem list. Problem list updated.  Objective:   Vitals:   12/23/24 1054  BP: 115/76  Pulse: (!) 105  Weight: 217 lb (98.4 kg)    Fetal Status: Fetal Heart Rate (bpm): 149/147   Movement: Present     General:  Alert, oriented and cooperative. Patient is in no acute distress.  Skin: Skin is warm and dry. No rash noted.   Cardiovascular: Normal heart rate noted  Respiratory: Normal respiratory effort, no problems with respiration noted  Abdomen: Soft, gravid, appropriate for gestational age. Pain/Pressure: Present (pressure)     Pelvic: Vag. Bleeding: None     Cervical exam deferred        Extremities: Normal range of motion.  Edema: None  Mental Status: Normal mood and affect. Normal behavior. Normal judgment and thought content.   Urinalysis:      Assessment and Plan:  Pregnancy: G3P1011 at [redacted]w[redacted]d  1. Supervision of high risk pregnancy, antepartum, second trimester (Primary) BP and both FHR normal  2. Dichorionic diamniotic twin pregnancy in third  trimester Last growth US  12/12/2024 Twin A: cephalic, 7%, 1988g, AC 4%, AFI wnl, normal UAD Twin B: transverse, 33%, 2305g, AC 60%, AFI wnl, normal UAD FWD 14% MFM recommend delivery at 37 weeks, scheduled for 01/02/2025 at [redacted]w[redacted]d as no sooner appt slots were available Form faxed, orders placed  3. Fetal growth restriction antepartum See above  4. ASCUS with positive high risk HPV cervical Plan for PP LEEP  5. Anemia complicating pregnancy, third trimester Lab Results  Component Value Date   HGB 8.5 (L) 10/28/2024   S/p IV iron   6. Secondary syphilis, relapse (treated) Titer checked last visit decreased from 1:2 to 1:1 c/w adequately treated infection  7. History of postpartum hypertension Normotensive, on ASA  8. GBS (group B Streptococcus carrier), +RV culture, currently pregnant Ppx in labor  Preterm labor symptoms and general obstetric precautions including but not limited to vaginal bleeding, contractions, leaking of fluid and fetal movement were reviewed in detail with the patient. Please refer to After Visit Summary for other counseling recommendations.  Return in about 1 week (around 12/30/2024) for Dyad patient, ob visit.   Lola Donnice HERO, MD "

## 2024-12-24 ENCOUNTER — Ambulatory Visit: Attending: Obstetrics and Gynecology | Admitting: Obstetrics and Gynecology

## 2024-12-24 ENCOUNTER — Ambulatory Visit

## 2024-12-24 DIAGNOSIS — O30043 Twin pregnancy, dichorionic/diamniotic, third trimester: Secondary | ICD-10-CM | POA: Diagnosis not present

## 2024-12-24 DIAGNOSIS — O365990 Maternal care for other known or suspected poor fetal growth, unspecified trimester, not applicable or unspecified: Secondary | ICD-10-CM

## 2024-12-24 DIAGNOSIS — O365931 Maternal care for other known or suspected poor fetal growth, third trimester, fetus 1: Secondary | ICD-10-CM

## 2024-12-24 DIAGNOSIS — Z3A36 36 weeks gestation of pregnancy: Secondary | ICD-10-CM

## 2024-12-24 DIAGNOSIS — O09293 Supervision of pregnancy with other poor reproductive or obstetric history, third trimester: Secondary | ICD-10-CM | POA: Insufficient documentation

## 2024-12-24 DIAGNOSIS — O365932 Maternal care for other known or suspected poor fetal growth, third trimester, fetus 2: Secondary | ICD-10-CM

## 2024-12-24 DIAGNOSIS — O30042 Twin pregnancy, dichorionic/diamniotic, second trimester: Secondary | ICD-10-CM

## 2024-12-24 NOTE — Progress Notes (Signed)
 After review, MFM consult with provider is not indicated for today  Arna Ranks, MD 12/24/2024 12:44 PM  [facility]

## 2024-12-25 ENCOUNTER — Encounter (HOSPITAL_COMMUNITY): Payer: Self-pay | Admitting: *Deleted

## 2024-12-25 ENCOUNTER — Telehealth (HOSPITAL_COMMUNITY): Payer: Self-pay | Admitting: *Deleted

## 2024-12-25 NOTE — Telephone Encounter (Signed)
 Preadmission screen

## 2024-12-31 ENCOUNTER — Ambulatory Visit

## 2025-01-01 ENCOUNTER — Ambulatory Visit: Admitting: Family Medicine

## 2025-01-01 ENCOUNTER — Encounter: Payer: Self-pay | Admitting: Family Medicine

## 2025-01-01 VITALS — BP 108/48 | HR 85 | Wt 220.3 lb

## 2025-01-01 DIAGNOSIS — O365990 Maternal care for other known or suspected poor fetal growth, unspecified trimester, not applicable or unspecified: Secondary | ICD-10-CM

## 2025-01-01 DIAGNOSIS — O365930 Maternal care for other known or suspected poor fetal growth, third trimester, not applicable or unspecified: Secondary | ICD-10-CM

## 2025-01-01 DIAGNOSIS — O99013 Anemia complicating pregnancy, third trimester: Secondary | ICD-10-CM

## 2025-01-01 DIAGNOSIS — O30043 Twin pregnancy, dichorionic/diamniotic, third trimester: Secondary | ICD-10-CM

## 2025-01-01 DIAGNOSIS — O0993 Supervision of high risk pregnancy, unspecified, third trimester: Secondary | ICD-10-CM

## 2025-01-01 DIAGNOSIS — Z8679 Personal history of other diseases of the circulatory system: Secondary | ICD-10-CM

## 2025-01-01 DIAGNOSIS — O99820 Streptococcus B carrier state complicating pregnancy: Secondary | ICD-10-CM

## 2025-01-01 DIAGNOSIS — Z8759 Personal history of other complications of pregnancy, childbirth and the puerperium: Secondary | ICD-10-CM

## 2025-01-01 DIAGNOSIS — R87810 Cervical high risk human papillomavirus (HPV) DNA test positive: Secondary | ICD-10-CM

## 2025-01-01 DIAGNOSIS — O0992 Supervision of high risk pregnancy, unspecified, second trimester: Secondary | ICD-10-CM

## 2025-01-01 DIAGNOSIS — Z3A37 37 weeks gestation of pregnancy: Secondary | ICD-10-CM

## 2025-01-01 DIAGNOSIS — R87610 Atypical squamous cells of undetermined significance on cytologic smear of cervix (ASC-US): Secondary | ICD-10-CM

## 2025-01-01 DIAGNOSIS — A5149 Other secondary syphilitic conditions: Secondary | ICD-10-CM

## 2025-01-01 NOTE — Progress Notes (Signed)
" ° °  Subjective:  Kierstynn Babich is a 29 y.o. G3P1011 at [redacted]w[redacted]d being seen today for ongoing prenatal care.  She is currently monitored for the following issues for this high-risk pregnancy and has ASCUS with positive high risk HPV cervical; Anemia complicating pregnancy, third trimester; GBS (group B Streptococcus carrier), +RV culture, currently pregnant; Secondary syphilis, relapse (treated); Supervision of high risk pregnancy, antepartum, second trimester; History of postpartum hypertension; Twin pregnancy, twins dichorionic and diamniotic; and Fetal growth restriction antepartum on their problem list.  Patient reports no complaints.  Contractions: Not present. Vag. Bleeding: None.  Movement: Present. Denies leaking of fluid.   The following portions of the patient's history were reviewed and updated as appropriate: allergies, current medications, past family history, past medical history, past social history, past surgical history and problem list. Problem list updated.  Objective:   Vitals:   01/01/25 1100  BP: (!) 108/48  Pulse: 85  Weight: 220 lb 5 oz (99.9 kg)    Fetal Status: Fetal Heart Rate (bpm): 150/158   Movement: Present     General:  Alert, oriented and cooperative. Patient is in no acute distress.  Skin: Skin is warm and dry. No rash noted.   Cardiovascular: Normal heart rate noted  Respiratory: Normal respiratory effort, no problems with respiration noted  Abdomen: Soft, gravid, appropriate for gestational age. Pain/Pressure: Present     Pelvic: Vag. Bleeding: None     Cervical exam deferred        Extremities: Normal range of motion.  Edema: None  Mental Status: Normal mood and affect. Normal behavior. Normal judgment and thought content.   Urinalysis:      Assessment and Plan:  Pregnancy: G3P1011 at [redacted]w[redacted]d  1. Supervision of high risk pregnancy, antepartum, second trimester (Primary) BP and both FHR normal  2. Dichorionic diamniotic twin pregnancy in third  trimester Last growth US  12/12/2024 Twin A: cephalic, 7%, 1988g, AC 4%, AFI wnl, normal UAD Twin B: transverse, 33%, 2305g, AC 60%, AFI wnl, normal UAD FWD 14% Scheduled for IOL tomorrow Discussed possibility of breech extraction with twin B, possible combination delivery, etc.   3. Fetal growth restriction antepartum See above   4. ASCUS with positive high risk HPV cervical Plan for PP LEEP   5. Anemia complicating pregnancy, third trimester Recent Labs       Lab Results  Component Value Date    HGB 8.5 (L) 10/28/2024      S/p IV iron    6. Secondary syphilis, relapse (treated) Titer checked last visit decreased from 1:2 to 1:1 c/w adequately treated infection   7. History of postpartum hypertension Normotensive, on ASA   8. GBS (group B Streptococcus carrier), +RV culture, currently pregnant Ppx in labor  Term labor symptoms and general obstetric precautions including but not limited to vaginal bleeding, contractions, leaking of fluid and fetal movement were reviewed in detail with the patient. Please refer to After Visit Summary for other counseling recommendations.  No follow-ups on file.   Lola Donnice HERO, MD "

## 2025-01-01 NOTE — Patient Instructions (Signed)

## 2025-01-02 ENCOUNTER — Encounter (HOSPITAL_COMMUNITY): Payer: Self-pay | Admitting: Family Medicine

## 2025-01-02 ENCOUNTER — Inpatient Hospital Stay (HOSPITAL_COMMUNITY)
Admission: RE | Admit: 2025-01-02 | Discharge: 2025-01-04 | DRG: 806 | Disposition: A | Attending: Family Medicine | Admitting: Family Medicine

## 2025-01-02 ENCOUNTER — Other Ambulatory Visit: Payer: Self-pay

## 2025-01-02 ENCOUNTER — Inpatient Hospital Stay (HOSPITAL_COMMUNITY): Admitting: Anesthesiology

## 2025-01-02 ENCOUNTER — Inpatient Hospital Stay (HOSPITAL_COMMUNITY)

## 2025-01-02 DIAGNOSIS — O321XX2 Maternal care for breech presentation, fetus 2: Secondary | ICD-10-CM | POA: Diagnosis present

## 2025-01-02 DIAGNOSIS — Z3A37 37 weeks gestation of pregnancy: Secondary | ICD-10-CM | POA: Diagnosis not present

## 2025-01-02 DIAGNOSIS — O99824 Streptococcus B carrier state complicating childbirth: Secondary | ICD-10-CM | POA: Diagnosis present

## 2025-01-02 DIAGNOSIS — O9812 Syphilis complicating childbirth: Secondary | ICD-10-CM | POA: Diagnosis present

## 2025-01-02 DIAGNOSIS — O30043 Twin pregnancy, dichorionic/diamniotic, third trimester: Secondary | ICD-10-CM | POA: Diagnosis present

## 2025-01-02 DIAGNOSIS — O9902 Anemia complicating childbirth: Secondary | ICD-10-CM | POA: Diagnosis present

## 2025-01-02 DIAGNOSIS — Z833 Family history of diabetes mellitus: Secondary | ICD-10-CM

## 2025-01-02 LAB — CBC
HCT: 29.9 % — ABNORMAL LOW (ref 36.0–46.0)
Hemoglobin: 9.4 g/dL — ABNORMAL LOW (ref 12.0–15.0)
MCH: 25.3 pg — ABNORMAL LOW (ref 26.0–34.0)
MCHC: 31.4 g/dL (ref 30.0–36.0)
MCV: 80.6 fL (ref 80.0–100.0)
Platelets: 236 10*3/uL (ref 150–400)
RBC: 3.71 MIL/uL — ABNORMAL LOW (ref 3.87–5.11)
RDW: 19.7 % — ABNORMAL HIGH (ref 11.5–15.5)
WBC: 6.4 10*3/uL (ref 4.0–10.5)
nRBC: 0.3 % — ABNORMAL HIGH (ref 0.0–0.2)

## 2025-01-02 LAB — PROTEIN / CREATININE RATIO, URINE
Creatinine, Urine: 59 mg/dL
Protein Creatinine Ratio: 0.1 mg/mg
Total Protein, Urine: 8 mg/dL

## 2025-01-02 LAB — COMPREHENSIVE METABOLIC PANEL WITH GFR
ALT: 8 U/L (ref 0–44)
AST: 18 U/L (ref 15–41)
Albumin: 3.5 g/dL (ref 3.5–5.0)
Alkaline Phosphatase: 150 U/L — ABNORMAL HIGH (ref 38–126)
Anion gap: 12 (ref 5–15)
BUN: 6 mg/dL (ref 6–20)
CO2: 21 mmol/L — ABNORMAL LOW (ref 22–32)
Calcium: 8.9 mg/dL (ref 8.9–10.3)
Chloride: 103 mmol/L (ref 98–111)
Creatinine, Ser: 0.53 mg/dL (ref 0.44–1.00)
GFR, Estimated: >60 mL/min
Glucose, Bld: 75 mg/dL (ref 70–99)
Potassium: 3.8 mmol/L (ref 3.5–5.1)
Sodium: 136 mmol/L (ref 135–145)
Total Bilirubin: 0.2 mg/dL (ref 0.0–1.2)
Total Protein: 6.8 g/dL (ref 6.5–8.1)

## 2025-01-02 LAB — TYPE AND SCREEN
ABO/RH(D): A POS
Antibody Screen: NEGATIVE

## 2025-01-02 LAB — SYPHILIS: RPR W/REFLEX TO RPR TITER AND TREPONEMAL ANTIBODIES, TRADITIONAL SCREENING AND DIAGNOSIS ALGORITHM
RPR Ser Ql: REACTIVE — AB
RPR Titer: 1:1 {titer}

## 2025-01-02 MED ORDER — EPHEDRINE 5 MG/ML INJ: 10.0000 mg | INTRAVENOUS | Status: DC | PRN

## 2025-01-02 MED ORDER — PHENYLEPHRINE 80 MCG/ML (10ML) SYRINGE FOR IV PUSH (FOR BLOOD PRESSURE SUPPORT): 80.0000 ug | PREFILLED_SYRINGE | INTRAVENOUS | Status: DC | PRN

## 2025-01-02 MED ORDER — OXYTOCIN-SODIUM CHLORIDE 30-0.9 UT/500ML-% IV SOLN: 2.5000 [IU]/h | INTRAVENOUS | Status: DC

## 2025-01-02 MED ORDER — SOD CITRATE-CITRIC ACID 500-334 MG/5ML PO SOLN: 30.0000 mL | ORAL | Status: DC | PRN

## 2025-01-02 MED ORDER — PENICILLIN G POT IN DEXTROSE 60000 UNIT/ML IV SOLN
3.0000 10*6.[IU] | INTRAVENOUS | Status: DC
  Administered 2025-01-02 (×2): 3 10*6.[IU] via INTRAVENOUS
  Filled 2025-01-02 (×2): qty 50

## 2025-01-02 MED ORDER — LIDOCAINE HCL (PF) 1 % IJ SOLN
INTRAMUSCULAR | Status: DC | PRN
  Administered 2025-01-02 (×2): 4 mL via EPIDURAL

## 2025-01-02 MED ORDER — BUPIVACAINE HCL (PF) 0.25 % IJ SOLN
INTRAMUSCULAR | Status: DC | PRN
  Administered 2025-01-02: 10 mL via EPIDURAL

## 2025-01-02 MED ORDER — FENTANYL-BUPIVACAINE-NACL 0.5-0.125-0.9 MG/250ML-% EP SOLN
12.0000 mL/h | EPIDURAL | Status: DC | PRN
  Administered 2025-01-02: 12 mL/h via EPIDURAL
  Filled 2025-01-02: qty 250

## 2025-01-02 MED ORDER — ACETAMINOPHEN 325 MG PO TABS: 650.0000 mg | ORAL_TABLET | ORAL | Status: DC | PRN

## 2025-01-02 MED ORDER — TERBUTALINE SULFATE 1 MG/ML IJ SOLN: 0.2500 mg | Freq: Once | INTRAMUSCULAR | Status: DC | PRN

## 2025-01-02 MED ORDER — LACTATED RINGERS IV SOLN: INTRAVENOUS | Status: DC

## 2025-01-02 MED ORDER — DIPHENHYDRAMINE HCL 50 MG/ML IJ SOLN: 12.5000 mg | INTRAMUSCULAR | Status: DC | PRN

## 2025-01-02 MED ORDER — LIDOCAINE HCL (PF) 1 % IJ SOLN
30.0000 mL | INTRAMUSCULAR | Status: AC | PRN
  Administered 2025-01-02: 30 mL via SUBCUTANEOUS
  Filled 2025-01-02: qty 30

## 2025-01-02 MED ORDER — LACTATED RINGERS IV SOLN
500.0000 mL | Freq: Once | INTRAVENOUS | Status: AC
  Administered 2025-01-02: 500 mL via INTRAVENOUS

## 2025-01-02 MED ORDER — OXYTOCIN BOLUS FROM INFUSION
333.0000 mL | Freq: Once | INTRAVENOUS | Status: AC
  Administered 2025-01-02: 333 mL via INTRAVENOUS

## 2025-01-02 MED ORDER — OXYTOCIN-SODIUM CHLORIDE 30-0.9 UT/500ML-% IV SOLN
1.0000 m[IU]/min | INTRAVENOUS | Status: DC
  Administered 2025-01-02: 2 m[IU]/min via INTRAVENOUS
  Filled 2025-01-02: qty 500

## 2025-01-02 MED ORDER — FENTANYL CITRATE (PF) 100 MCG/2ML IJ SOLN
50.0000 ug | INTRAMUSCULAR | Status: DC | PRN
  Administered 2025-01-02: 100 ug via INTRAVENOUS
  Filled 2025-01-02: qty 2

## 2025-01-02 MED ORDER — TRANEXAMIC ACID-NACL 1000-0.7 MG/100ML-% IV SOLN
1000.0000 mg | Freq: Once | INTRAVENOUS | Status: AC
  Administered 2025-01-02: 1000 mg via INTRAVENOUS

## 2025-01-02 MED ORDER — SODIUM CHLORIDE 0.9 % IV SOLN
5.0000 10*6.[IU] | Freq: Once | INTRAVENOUS | Status: AC
  Administered 2025-01-02: 5 10*6.[IU] via INTRAVENOUS
  Filled 2025-01-02: qty 5

## 2025-01-02 MED ORDER — LACTATED RINGERS IV SOLN: 500.0000 mL | INTRAVENOUS | Status: DC | PRN

## 2025-01-02 MED ORDER — ONDANSETRON HCL 4 MG/2ML IJ SOLN: 4.0000 mg | Freq: Four times a day (QID) | INTRAMUSCULAR | Status: DC | PRN

## 2025-01-02 MED ORDER — TRANEXAMIC ACID-NACL 1000-0.7 MG/100ML-% IV SOLN
INTRAVENOUS | Status: AC
  Filled 2025-01-02: qty 100

## 2025-01-02 NOTE — Discharge Summary (Signed)
 "    Postpartum Discharge Summary  Date of Service updated***     Patient Name: Darlene Ortiz DOB: 1996-03-17 MRN: [id number]  Date of admission: 01/02/2025 Delivery date:   Darlene Ortiz, Darlene Ortiz [[id number]]  01/02/2025    Darlene Ortiz, Darlene Ortiz [[id number]]  01/02/2025 Delivering provider:    Oyindamola, Key [[id number]]  Lynzy, Rawles [[id number]]  FREDIRICK GLENYS GORMAN Date of discharge: 01/02/2025  Admitting diagnosis: Labor and delivery, indication for care [O75.9] Intrauterine pregnancy: [redacted]w[redacted]d     Secondary diagnosis:  Principal Problem:   Labor and delivery, indication for care  Additional problems: ***    Discharge diagnosis: {DX.:23714}                                              Post partum procedures:{Postpartum procedures:23558} Augmentation: AROM, Pitocin , and IP Foley Complications: None  [facility] course: Induction of Labor With Vaginal Delivery   29 y.o. yo G3P1011 at [redacted]w[redacted]d was admitted to the [facility] 01/02/2025 for induction of labor.  Indication for induction: di/di twins.  Patient had an uncomplicated labor course.  Details of delivery can be found in separate delivery note.  Patient had a postpartum course complicated by***. Patient is discharged home 01/02/25.  Membrane Rupture Time/Date:    Darlene [facility], Darlene [facility]iz [[id number]]  4:45 PM    Darlene Ortiz, Darlene Ortiz [[id number]]  4:45 PM,   Darlene Ortiz, Darlene [facility]iz [[id number]]  01/02/2025    Darlene Ortiz, Darlene Ortiz [[id number]]  01/02/2025  Delivery Method:   Darlene [facility]iz [[id number]]  Vaginal, Spontaneous    Darlene [facility], [facility] [facility] [[id number]]  Vaginal, Breech Operative Delivery:N/A Episiotomy:    Darlene Ortiz, Darlene Ortiz [[id number]]  None    Darlene Ortiz, Darlene Ortiz [[id number]]  None Lacerations:     Darlene Ortiz, Gimpel [[id number]]  2nd degree    Ernesha, Ramone [[id number]]  2nd degree   Newborn Data: Birth date:   Jet, Traynham [[id number]]  01/02/2025    Aishani, Kalis  [[id number]]  01/02/2025 Birth time:   Novalie, Leamy [[id number]]  9:02 PM    Darlene Loistine [facility] [[id number]]  9:22 PM Gender:   Audrea, Bolte [[id number]]  Female    Arabelle, Bollig [[id number]]  Female Living status:   Sorina, Derrig [[id number]]  Living    Nikkie, Liming [place] [[id number]]  Living Apgars:   Taesha, Goodell [[id number]]  [address] [place] [[id number]]  6 ,   Lindora, Alviar [[id number]]  [address] [place] [[id number]]  9  Weight:   Adrinne, Sze [[id number]]  2590 g    Marilyn, Nihiser [[id number]]  2670 g  Magnesium Sulfate received: {Mag received:30440022} BMZ received: No Rhophylac:N/A MMR:N/A T-DaP:Given prenatally Flu: Yes RSV Vaccine received: No Transfusion:{Transfusion received:30440034}  Immunizations received: Immunization History  Administered Date(s) Administered   Influenza, Seasonal, Injecte, Preservative Fre 10/02/2023, 10/01/2024   PFIZER(Purple Top)SARS-COV-2 Vaccination 08/10/2020, 08/31/2020   Tdap 09/06/2020, 01/05/2023, 10/28/2024    Physical exam  Vitals:   01/02/25 2030 01/02/25 2200 01/02/25 2234 01/02/25 2246  BP: 113/66 124/75 130/68 123/80  Pulse: 91 98 90 95  Resp:      Temp:      TempSrc:      SpO2:       General: {Exam; general:21111117} Lochia: {Desc; appropriate/inappropriate:30686::appropriate} Uterine Fundus: {Desc; firm/soft:30687} Incision: {  Exam; incision:21111123} DVT Evaluation: {Exam; dvt:2111122} Labs: Lab Results  Component Value Date   WBC 6.4 01/02/2025   HGB 9.4 (L) 01/02/2025   HCT 29.9 (L) 01/02/2025   MCV 80.6 01/02/2025   PLT 236 01/02/2025      Latest Ref Rng & Units 01/02/2025   10:35 AM  CMP  Glucose 70 - 99 mg/dL 75   BUN 6 - 20 mg/dL 6   Creatinine 9.55 - 8.99 mg/dL 9.46   Sodium 864 - 854 mmol/L 136   Potassium 3.5 - 5.1 mmol/L 3.8   Chloride 98 - 111 mmol/L 103   CO2 22 - 32 mmol/L 21   Calcium 8.9 - 10.3 mg/dL 8.9    Total Protein 6.5 - 8.1 g/dL 6.8   Total Bilirubin 0.0 - 1.2 mg/dL 0.2   Alkaline Phos 38 - 126 U/L 150   AST 15 - 41 U/L 18   ALT 0 - 44 U/L 8    Edinburgh Score:    04/18/2023    9:11 AM  Edinburgh Postnatal Depression Scale Screening Tool  I have been able to laugh and see the funny side of things. 0   I have looked forward with enjoyment to things. 0   I have blamed myself unnecessarily when things went wrong. 1   I have been anxious or worried for no good reason. 0   I have felt scared or panicky for no good reason. 0   Things have been getting on top of me. 0   I have been so unhappy that I have had difficulty sleeping. 0   I have felt sad or miserable. 0   I have been so unhappy that I have been crying. 0   The thought of harming myself has occurred to me. 0   Edinburgh Postnatal Depression Scale Total 1      Data saved with a previous flowsheet row definition   No data recorded  After visit meds:  Allergies as of 01/02/2025   No Known Allergies   Med Rec must be completed prior to using this [facility]***        Discharge home in stable condition Infant Feeding: {Baby feeding:23562} Infant Disposition:{CHL IP OB HOME WITH FNUYZM:76418} Discharge instruction: per After Visit Summary and Postpartum booklet. Activity: Advance as tolerated. Pelvic rest for 6 weeks.  Diet: {OB ipzu:78888878} Future Appointments:No future appointments. Follow up Visit:  -Message sent to [facility] on 1/30.   01/02/2025 Barkley LITTIE Angles, MD    "

## 2025-01-02 NOTE — Anesthesia Preprocedure Evaluation (Signed)
"                                    Anesthesia Evaluation  Patient identified by MRN, date of birth, ID band Patient awake    Reviewed: Allergy & Precautions, Patient's Chart, lab work & pertinent test results  History of Anesthesia Complications Negative for: history of anesthetic complications  Airway Mallampati: II  TM Distance: >3 FB Neck ROM: Full    Dental no notable dental hx.    Pulmonary neg pulmonary ROS   Pulmonary exam normal        Cardiovascular negative cardio ROS Normal cardiovascular exam     Neuro/Psych negative neurological ROS     GI/Hepatic negative GI ROS, Neg liver ROS,,,  Endo/Other  negative endocrine ROS    Renal/GU negative Renal ROS     Musculoskeletal negative musculoskeletal ROS (+)    Abdominal   Peds  Hematology  (+) Blood dyscrasia (Hgb 9.4), anemia   Anesthesia Other Findings   Reproductive/Obstetrics (+) Pregnancy (twins)                              Anesthesia Physical Anesthesia Plan  ASA: 2  Anesthesia Plan: Epidural   Post-op Pain Management:    Induction:   PONV Risk Score and Plan: Treatment may vary due to age or medical condition  Airway Management Planned: Natural Airway  Additional Equipment: Fetal Monitoring  Intra-op Plan:   Post-operative Plan:   Informed Consent: I have reviewed the patients History and Physical, chart, labs and discussed the procedure including the risks, benefits and alternatives for the proposed anesthesia with the patient or authorized representative who has indicated his/her understanding and acceptance.       Plan Discussed with:   Anesthesia Plan Comments:         Anesthesia Quick Evaluation  "

## 2025-01-02 NOTE — H&P (Signed)
 OBSTETRIC ADMISSION HISTORY AND PHYSICAL  Darlene Ortiz is a 29 y.o. female G3P1011 with IUP at [redacted]w[redacted]d by US  presenting for IOL for di/di twin pregnancy. She reports +FMs, No LOF, no VB, no blurry vision, headaches or peripheral edema, and RUQ pain.  She plans on breast feeding. She request OCPs for birth control. She received her prenatal care at [facility]   Dating: By US  --->  Estimated Date of Delivery: 01/21/25  Sono:    @[redacted]w[redacted]d , CWD, normal anatomy,  - Twin A: cephalic presentation, anterior placenta, normal amniotic fluid, selective growth restriction - Twin B: transverse lie, anterior placenta, normal amniotic fluid    Prenatal History/Complications:  NURSING  PROVIDER  [employer], [employer] Dating by US  at 11 weeks  PNC Model Mom-Baby Dyad Anatomy U/S Di/di twins, IUGR twin A  Initiated care at  [facility]  English               LAB RESULTS   Support Person  FOB Genetics NIPS:  AFP: normal      NT/IT (FT only)        Carrier Screen Horizon:   Rhogam  A/Positive/-- (08/06 1632) A1C/GTT Early HgbA1C: normal Third trimester 2 hr GTT: normal  Flu Vaccine 10/01/2024      TDaP Vaccine  10/28/2024 Blood Type A/Positive/-- (08/06 1632)  RSV Vaccine   Antibody Negative (08/06 1632)  COVID Vaccine  Yes Rubella 3.27 (08/06 1632)  Feeding Plan breast RPR Reactive (01/06 1214)  Contraception oral contraceptives (estrogen/progesterone) HBsAg Negative (08/06 1632)  Circumcision   HIV Non Reactive (01/06 1214)  Pediatrician    HCVAb Non Reactive (08/06 1632)  Prenatal Classes        BTL Consent   Pap       Diagnosis  Date Value Ref Range Status  07/09/2024 (A)   Final    - Atypical squamous cells of undetermined significance (ASC-US )    BTL Pre-payment   GC/CT Initial:  neg/neg 36wks:  neg/neg  VBAC Consent   GBS Positive/-- (01/06 1213) For PCN allergy, check sensitivities   BRx Optimized? [ ]  yes   [ ]  no      DME Rx [ x] BP cuff [N/a ] Weight Scale  Waterbirth  [ ]  Class [ ]  Consent [ ]  CNM visit  PHQ9 & GAD7 [  ] new OB [  ] 62 weeks  [  ] 36 weeks Induction  [ ]  Orders Entered [ ] Foley Y/N     Past Medical History: Past Medical History:  Diagnosis Date   Anemia    Syphilis 11/2022    Past Surgical History: Past Surgical History:  Procedure Laterality Date   TOOTH EXTRACTION      Obstetrical History: OB History     Gravida  3   Para  1   Term  1   Preterm      AB  1   Living  1      SAB      IAB  1   Ectopic      Multiple  0   Live Births  1           Social History Social History   Socioeconomic History   Marital status: Single    Spouse name: Not on file   Number of children: Not on file   Years of education: Not on file  Highest education level: Not on file  Occupational History   Not on file  Tobacco Use   Smoking status: Never   Smokeless tobacco: Never  Vaping Use   Vaping status: Never Used  Substance and Sexual Activity   Alcohol use: Not Currently    Comment: occassionally   Drug use: Not Currently    Types: Marijuana    Comment: last use years ago   Sexual activity: Yes    Birth control/protection: None    Comment: none  Other Topics Concern   Not on file  Social History Narrative   Not on file   Social Drivers of Health   Tobacco Use: Low Risk (01/01/2025)   Patient History    Smoking Tobacco Use: Never    Smokeless Tobacco Use: Never    Passive Exposure: Not on file  Financial Resource Strain: Not on file  Food Insecurity: Not on file  Transportation Needs: Not on file  Physical Activity: Not on file  Stress: Not on file  Social Connections: Not on file  Depression (PHQ2-9): Low Risk (07/09/2024)   Depression (PHQ2-9)    PHQ-2 Score: 0  Alcohol Screen: Not on file  Housing: Not on file  Utilities: Not on file  Health Literacy: Not on file    Family History: Family History  Problem Relation Age of Onset   Diabetes Mother    Breast cancer  Maternal Grandmother     Allergies: Allergies[1]  Medications Prior to Admission  Medication Sig Dispense Refill Last Dose/Taking   aspirin  EC 81 MG tablet Take 2 tablets (162 mg total) by mouth daily. Swallow whole. (Patient not taking: Reported on 12/23/2024) 60 tablet 12    Blood Pressure Monitoring (BLOOD PRESSURE KIT) DEVI 1 Device by Does not apply route as needed. (Patient not taking: Reported on 12/23/2024) 1 each 0    Hydrocortisone , Perianal, (PROCTOCORT ) 1 % CREA Apply 1 Application topically as needed. (Patient not taking: Reported on 01/01/2025) 28.4 g 1    polyethylene glycol powder (GLYCOLAX /MIRALAX ) 17 GM/SCOOP powder Take 17 g by mouth daily as needed. (Patient not taking: Reported on 12/23/2024) 510 g 1    Prenatal Vit-Fe Fumarate-FA (PRENATAL VITAMINS) 28-0.8 MG TABS Take 1 tablet by mouth daily at 6 (six) AM. (Patient not taking: Reported on 01/01/2025) 90 tablet 3      Review of Systems   All systems reviewed and negative except as stated in HPI  Last menstrual period 04/23/2024, not currently breastfeeding. General appearance: alert, cooperative, appears stated age, and no distress Lungs: clear to auscultation bilaterally Heart: regular rate and rhythm Abdomen: soft, non-tender; bowel sounds normal Extremities: Homans sign is negative, no sign of DVT Presentation: Baby A: cephalic; Baby B: breech - ultrasound completed by Dr. Izell Fetal monitoring: Baby A: baseline 140/moderate variability/+accels/-decels Baby B: baseline 145/moderate variability/+accels/-decels Uterine activityFrequency: Every 3-4 minutes    Prenatal labs: ABO, Rh: A/Positive/-- (08/06 1632) Antibody: Negative (08/06 1632) Rubella: 3.27 (08/06 1632) RPR: Reactive (01/06 1214)  HBsAg: Negative (08/06 1632)  HIV: Non Reactive (01/06 1214)  GBS: Positive/-- (01/06 1213)    Lab Results  Component Value Date   GBS Positive (A) 12/09/2024   GTT normal Genetic screening  Low-risk  NIPS Anatomy US   Diamniotic Dichorionic pregnancy at an advanced gestational age of 27w 6d   Normal anatomy with good amniotic fluid and fetal movement  was observed in Twin A and B. Suboptimal views of the fetal anatomy were obtained secondary to multiple gestation and advance  gestational age.  Immunization History  Administered Date(s) Administered   Influenza, Seasonal, Injecte, Preservative Fre 10/02/2023, 10/01/2024   PFIZER(Purple Top)SARS-COV-2 Vaccination 08/10/2020, 08/31/2020   Tdap 09/06/2020, 01/05/2023, 10/28/2024    Prenatal Transfer Tool  Maternal Diabetes: No Genetic Screening: Normal Maternal Ultrasounds/Referrals: Other: di/di pregnancy Fetal Ultrasounds or other Referrals:  Referred to Materal Fetal Medicine  Maternal Substance Abuse:  No Significant Maternal Medications:  None Significant Maternal Lab Results: Group B Strep positive Number of Prenatal Visits:greater than 3 verified prenatal visits Maternal Vaccinations:TDap and Flu Other Comments:  None   No results found for this or any previous visit (from the past 24 hours).  Patient Active Problem List   Diagnosis Date Noted   Fetal growth restriction antepartum 10/28/2024   Twin pregnancy, twins dichorionic and diamniotic 08/27/2024   Supervision of high risk pregnancy, antepartum, second trimester 07/02/2024   History of postpartum hypertension 07/02/2024   Secondary syphilis, relapse (treated) 10/02/2023   GBS (group B Streptococcus carrier), +RV culture, currently pregnant 03/19/2023   Anemia complicating pregnancy, third trimester 01/09/2023   ASCUS with positive high risk HPV cervical 12/22/2022    Assessment/Plan:  Darlene Ortiz is a 29 y.o. G3P1011 at [redacted]w[redacted]d here for IOL for di/di twins  #Labor: IOL, contracting too frequently for medication, foley balloon placed, filled with 60mL #Pain: Per patient request #FWB: Category I for baby A and B #GBS status:  Positive  #Feeding: Breastmilk   #Reproductive Life planning: Combination OCPs #Circ:  not applicable  #Breech - Baby B breech, did discuss risks associated with breech presentation, does want to continue with vaginal/breech delivery  # Secondary syphilis, relapse (treated) Titer checked last visit decreased from 1:2 to 1:1 c/w adequately treated infection  #Anemia of 3rd trimester - Starting Hgb 9.4  #ASC-US  - Plan for colpo PP  Garen SHAUNNA Puffer, MD  01/02/2025, 9:49 AM   Attestation of Supervision of Resident: Evaluation and management procedures were performed by the learners: Family Medicine Resident under my supervision. I was immediately available for direct supervision, assistance and direction throughout this encounter.  I also confirm that I have verified the information documented in the residents note, and that I have also personally reperformed the pertinent components of the physical exam and all of the medical decision making activities.  I have also made any necessary editorial changes.  Charlie DELENA Courts, MD FM-OB Fellow [facility]       [1] No Known Allergies

## 2025-01-02 NOTE — Anesthesia Procedure Notes (Signed)
 Epidural Patient location during procedure: OB Start time: 01/02/2025 3:53 PM End time: 01/02/2025 3:56 PM  Staffing Anesthesiologist: Paul Lamarr BRAVO, MD Performed: anesthesiologist   Preanesthetic Checklist Completed: patient identified, IV checked, risks and benefits discussed, monitors and equipment checked, pre-op evaluation and timeout performed  Epidural Patient position: sitting Prep: DuraPrep and site prepped and draped Patient monitoring: continuous pulse ox, blood pressure and heart rate Approach: midline Location: L3-L4 Injection technique: LOR air  Needle:  Needle type: Tuohy  Needle gauge: 17 G Needle length: 9 cm Needle insertion depth: 5 cm Catheter type: closed end flexible Catheter size: 19 Gauge Catheter at skin depth: 10 cm Test dose: negative and Other (1% lidocaine )  Assessment Events: blood not aspirated, no cerebrospinal fluid, injection not painful, no injection resistance, no paresthesia and negative IV test  Additional Notes Patient identified. Risks, benefits, and alternatives discussed with patient including but not limited to bleeding, infection, nerve damage, paralysis, failed block, incomplete pain control, headache, blood pressure changes, nausea, vomiting, reactions to medication, itching, and postpartum back pain. Confirmed with bedside nurse the patient's most recent platelet count. Confirmed with patient that they are not currently taking any anticoagulation, have any bleeding history, or any family history of bleeding disorders. Patient expressed understanding and wished to proceed. All questions were answered. Sterile technique was used throughout the entire procedure. Please see nursing notes for vital signs.   Crisp LOR on first pass. Test dose was given through epidural catheter and negative prior to continuing to dose epidural or start infusion. Warning signs of high block given to the patient including shortness of breath,  tingling/numbness in hands, complete motor block, or any concerning symptoms with instructions to call for help. Patient was given instructions on fall risk and not to get out of bed. All questions and concerns addressed with instructions to call with any issues or inadequate analgesia.  Reason for block:procedure for pain

## 2025-01-03 ENCOUNTER — Encounter (HOSPITAL_COMMUNITY): Payer: Self-pay | Admitting: Family Medicine

## 2025-01-03 MED ORDER — WITCH HAZEL-GLYCERIN EX PADS: 1.0000 | MEDICATED_PAD | CUTANEOUS | Status: DC | PRN

## 2025-01-03 MED ORDER — SODIUM CHLORIDE 0.9% FLUSH: 3.0000 mL | Freq: Two times a day (BID) | INTRAVENOUS | Status: DC

## 2025-01-03 MED ORDER — ACETAMINOPHEN 325 MG PO TABS
650.0000 mg | ORAL_TABLET | ORAL | Status: DC | PRN
  Administered 2025-01-03 – 2025-01-04 (×4): 650 mg via ORAL
  Filled 2025-01-03 (×4): qty 2

## 2025-01-03 MED ORDER — SODIUM CHLORIDE 0.9% FLUSH: 3.0000 mL | INTRAVENOUS | Status: DC | PRN

## 2025-01-03 MED ORDER — MEASLES, MUMPS & RUBELLA VAC ~~LOC~~ SUSR: 0.5000 mL | Freq: Once | SUBCUTANEOUS | Status: DC

## 2025-01-03 MED ORDER — DIPHENHYDRAMINE HCL 25 MG PO CAPS: 25.0000 mg | ORAL_CAPSULE | Freq: Four times a day (QID) | ORAL | Status: DC | PRN

## 2025-01-03 MED ORDER — OXYCODONE HCL 5 MG PO TABS: 5.0000 mg | ORAL_TABLET | Freq: Four times a day (QID) | ORAL | Status: DC | PRN

## 2025-01-03 MED ORDER — SIMETHICONE 80 MG PO CHEW: 80.0000 mg | CHEWABLE_TABLET | ORAL | Status: DC | PRN

## 2025-01-03 MED ORDER — SENNOSIDES-DOCUSATE SODIUM 8.6-50 MG PO TABS
2.0000 | ORAL_TABLET | ORAL | Status: DC
  Administered 2025-01-03: 2 via ORAL
  Filled 2025-01-03 (×3): qty 2

## 2025-01-03 MED ORDER — PRENATAL MULTIVITAMIN CH
1.0000 | ORAL_TABLET | Freq: Every day | ORAL | Status: DC
  Administered 2025-01-04: 1 via ORAL
  Filled 2025-01-03: qty 1

## 2025-01-03 MED ORDER — IBUPROFEN 800 MG PO TABS
800.0000 mg | ORAL_TABLET | Freq: Three times a day (TID) | ORAL | Status: DC
  Administered 2025-01-03 – 2025-01-04 (×4): 800 mg via ORAL
  Filled 2025-01-03 (×6): qty 1

## 2025-01-03 MED ORDER — ONDANSETRON HCL 4 MG/2ML IJ SOLN: 4.0000 mg | INTRAMUSCULAR | Status: DC | PRN

## 2025-01-03 MED ORDER — TETANUS-DIPHTH-ACELL PERTUSSIS 5-2-15.5 LF-MCG/0.5 IM SUSP: 0.5000 mL | Freq: Once | INTRAMUSCULAR | Status: DC

## 2025-01-03 MED ORDER — COCONUT OIL OIL: 1.0000 | TOPICAL_OIL | Status: DC | PRN

## 2025-01-03 MED ORDER — BENZOCAINE-MENTHOL 20-0.5 % EX AERO
1.0000 | INHALATION_SPRAY | CUTANEOUS | Status: DC | PRN
  Filled 2025-01-03: qty 56

## 2025-01-03 MED ORDER — SODIUM CHLORIDE 0.9 % IV SOLN: 250.0000 mL | INTRAVENOUS | Status: DC | PRN

## 2025-01-03 MED ORDER — ONDANSETRON HCL 4 MG PO TABS: 4.0000 mg | ORAL_TABLET | ORAL | Status: DC | PRN

## 2025-01-03 MED ORDER — DIBUCAINE (PERIANAL) 1 % EX OINT
1.0000 | TOPICAL_OINTMENT | CUTANEOUS | Status: DC | PRN
  Filled 2025-01-03: qty 28

## 2025-01-03 NOTE — Lactation Note (Signed)
 This note was copied from a baby's chart. Lactation Consultation Note  Patient Name: Darlene Ortiz Unijb'd Date: 01/03/2025 Age:30 hours Reason for consult: Initial assessment;Early term 37-38.6wks;Infant < 6lbs;Multiple gestation  P2 Twins mom stated she was tired. It was time to feed babies. Mom stated she was going to formula feed at this time she would try BF tomorrow. Mom asked a lot of questions about pumping and bottle feeding. Mom may be thinking about doing that instead of latching. LC told mom it is totally up to her what ever is good for her. Gave mom formula. LC suggested purple nipple mom stated they done well w/yellow. Mom asked if they could share the bottle. LC stated no they each get their own milk. Mom stated it seems like a waste of milk. Mom stated they shared a bottle in L&D. Told mom that having their own bottle not share germs and can keep up with how much they each drink a lot easier. Mom encouraged to feed baby 8-12 times/24 hours and with feeding cues. Gave mom supplement information sheet for BF then supplementing according to hours of age, and the bottle feeding not going to breast first. Baby A took formula well . Mom very tired. LC set up pump and fitted flange. Mom stated she will pump tomorrow. Encouraged to call for assistance as needed. Maternal Data Does the patient have breastfeeding experience prior to this delivery?: No  Feeding Nipple Type: Nfant Slow Flow (purple)  LATCH Score       Type of Nipple: Everted at rest and after stimulation  Comfort (Breast/Nipple): Soft / non-tender         Lactation Tools Discussed/Used Tools: Pump;Flanges Flange Size: 24 Breast pump type: Double-Electric Breast Pump Pump Education: Setup, frequency, and cleaning;Milk Storage Reason for Pumping: twins/less than 6 lbs Pumping frequency: q 3hr  Interventions Interventions: Breast feeding basics reviewed;DEBP;Education;Pace feeding;LC Services  brochure  Discharge Discharge Education: Outpatient recommendation Pump: Hands Free (arrowflow)  Consult Status Consult Status: Follow-up Date: 01/03/25 Follow-up type: In-patient    Lester Crickenberger G 01/03/2025, 1:35 AM

## 2025-01-03 NOTE — Lactation Note (Signed)
 This note was copied from a baby's chart. Lactation Consultation Note  Patient Name: Darlene Ortiz Unijb'd Date: 01/03/2025 Age:30 hours   P2- RN informed this LC that FOB is aware of MOB's syphilis status, so LC could discuss breastfeeding with syphilis. MOB's titer is at 1:1. LC reviewed the guidelines for breastfeeding safely. LC encouraged breast/nipple care when pumping. LC encouraged the use of coconut oil while pumping. LC encouraged MOB to look out for active lesions on the breasts. If there is an active lesion, MOB can continue pumping to encourage supply, but dump the milk until lesion is no longer present. LC also reviewed how to properly clean/sterilize breast pumps. MOB verbalized understanding of why this conversation was had. LC reassured MOB that she is doing a great job and she can call for assistance or if she has any questions/concerns.   Recardo Hoit BS, IBCLC 01/03/2025, 10:50 PM

## 2025-01-03 NOTE — Lactation Note (Signed)
 This note was copied from a baby's chart. Lactation Consultation Note  Patient Name: Darlene Ortiz Unijb'd Date: 01/03/2025 Age:30 hours Reason for consult: Initial assessment;Early term 37-38.6wks;Multiple gestation;Infant < 6lbs  Same note from baby A. Baby B didn't take the formula as well as baby A. Baby B has spillage and making a lot of noises w/sucking on the bottle. LC changed yellow nipple to purple nipple and baby done the same thing except not as much spillage and less noise. Baby didn't drink much. Suggested mom let babies rest then try to get more in them before hour is up. Maternal Data    Feeding Nipple Type: Nfant Slow Flow (purple)  LATCH Score       Type of Nipple: Everted at rest and after stimulation  Comfort (Breast/Nipple): Soft / non-tender         Lactation Tools Discussed/Used Tools: Pump;Flanges Flange Size: 24 Breast pump type: Double-Electric Breast Pump Pump Education: Setup, frequency, and cleaning;Milk Storage Reason for Pumping: less than 6 lbs/twins Pumping frequency: q 3hr  Interventions Interventions: Education;Pace feeding  Discharge    Consult Status Consult Status: Follow-up Date: 01/03/25 Follow-up type: In-patient    Deontaye Civello G 01/03/2025, 1:47 AM

## 2025-01-03 NOTE — Anesthesia Postprocedure Evaluation (Signed)
"   Anesthesia Post Note  Patient: Darlene Ortiz  Procedure(s) Performed: AN AD HOC LABOR EPIDURAL     Patient location during evaluation: Mother Baby Anesthesia Type: Epidural Level of consciousness: awake and alert Pain management: pain level controlled Vital Signs Assessment: post-procedure vital signs reviewed and stable Respiratory status: spontaneous breathing, nonlabored ventilation and respiratory function stable Cardiovascular status: stable Postop Assessment: no headache, no backache, epidural receding and able to ambulate Anesthetic complications: no   No notable events documented.  Last Vitals:  Vitals:   01/03/25 0100 01/03/25 0535  BP: 123/79 107/65  Pulse: (!) 103 91  Resp: 16 16  Temp: 36.7 C 36.6 C  SpO2: 100% 100%    Last Pain:  Vitals:   01/03/25 0537  TempSrc:   PainSc: 6    Pain Goal:                   Diane Hanel      "

## 2025-01-03 NOTE — Lactation Note (Signed)
 This note was copied from a baby's chart. Lactation Consultation Note  Patient Name: Darlene Ortiz Unijb'd Date: 01/03/2025 Age:30 hours Reason for consult: Follow-up assessment;1st time breastfeeding;Exclusive pumping and bottle feeding;Early term 37-38.6wks;Infant < 6lbs (h/o syphilis)  P2- MOB reports that she has latched the twins twice, but she really doesn't want to. MOB would like to exclusively pump for her girls and bottle feed them. MOB has the hospital DEBP set up in her room, but she has not started using it yet. LC encouraged MOB to pump every 3 hrs, even if nothing comes out, so her breasts have proper stimulation. MOB did not want to pump right now because she wanted to give the twins a bath. LC encouraged pumping after bath time.  LC reviewed the first 24 hr birthday nap, day 2 cluster feeding, feeding infant on cue 8-12x in 24 hrs, not allowing infant to go over 3 hrs without a feeding, CDC milk storage guidelines, LC services handout and engorgement/breast care. LC encouraged MOB to call for further assistance as needed.  Maternal Data Has patient been taught Hand Expression?: No Does the patient have breastfeeding experience prior to this delivery?: No  Feeding Mother's Current Feeding Choice: Breast Milk and Formula  Lactation Tools Discussed/Used Tools: Pump;Flanges Flange Size: 24 Breast pump type: Double-Electric Breast Pump;Manual Pump Education: Setup, frequency, and cleaning;Milk Storage Reason for Pumping: exclusive pumping Pumping frequency: 15-20 min every 3 hrs  Interventions Interventions: Breast feeding basics reviewed;Hand pump;DEBP;Education;LC Services brochure  Discharge Discharge Education: Engorgement and breast care;Warning signs for feeding baby Pump: DEBP;Personal  Consult Status Consult Status: Follow-up Date: 01/04/25 Follow-up type: In-patient    Recardo Hoit BS, IBCLC 01/03/2025, 5:30 PM

## 2025-01-03 NOTE — Progress Notes (Signed)
 Post Partum Day 1 Subjective: no complaints, up ad lib, voiding, and tolerating PO  Objective: Blood pressure 117/72, pulse 98, temperature 97.7 F (36.5 C), temperature source Oral, resp. rate 17, last menstrual period 04/23/2024, SpO2 100%, unknown if currently breastfeeding.  Physical Exam:  General: alert, cooperative, and no distress Lochia: appropriate Uterine Fundus: firm Incision: n/a DVT Evaluation: No evidence of DVT seen on physical exam.  Recent Labs    01/02/25 1035  HGB 9.4*  HCT 29.9*    Assessment/Plan: Plan for discharge tomorrow   LOS: 1 day   Darlene Ortiz, CNM 01/03/2025, 1:55 PM

## 2025-01-04 ENCOUNTER — Other Ambulatory Visit (HOSPITAL_COMMUNITY): Payer: Self-pay

## 2025-01-04 MED ORDER — IBUPROFEN 400 MG PO TABS
400.0000 mg | ORAL_TABLET | ORAL | 0 refills | Status: AC | PRN
  Filled 2025-01-04: qty 30, 5d supply, fill #0

## 2025-01-04 MED ORDER — ACETAMINOPHEN 325 MG PO TABS: 650.0000 mg | ORAL_TABLET | Freq: Four times a day (QID) | ORAL | Status: AC | PRN

## 2025-01-04 NOTE — Progress Notes (Signed)
 SW received a call from the RN requesting a pack n play  for the family. Due to inclement weather and remote work status, SW contacted [facility] to deliver requested item.   There are no barriers to discharge.   Darlene Ortiz, MSW, LCSW Clinical Social Work [phone]

## 2025-01-05 LAB — T.PALLIDUM AB, TOTAL: T Pallidum Abs: REACTIVE — AB

## 2025-01-06 ENCOUNTER — Ambulatory Visit: Payer: Self-pay | Admitting: Family Medicine

## 2025-01-07 LAB — SURGICAL PATHOLOGY

## 2025-02-04 ENCOUNTER — Ambulatory Visit: Admitting: Family Medicine

## 2025-02-18 ENCOUNTER — Ambulatory Visit: Admitting: Family Medicine
